# Patient Record
Sex: Male | Born: 1965 | Race: White | Hispanic: No | Smoking: Current every day smoker
Health system: Southern US, Community
[De-identification: ages and names within clinical notes are randomized; demographics above are authoritative.]

## PROBLEM LIST (undated history)

## (undated) DIAGNOSIS — G5621 Lesion of ulnar nerve, right upper limb: Secondary | ICD-10-CM

## (undated) DIAGNOSIS — G4700 Insomnia, unspecified: Secondary | ICD-10-CM

## (undated) DIAGNOSIS — D649 Anemia, unspecified: Secondary | ICD-10-CM

## (undated) DIAGNOSIS — A4902 Methicillin resistant Staphylococcus aureus infection, unspecified site: Secondary | ICD-10-CM

## (undated) DIAGNOSIS — D759 Disease of blood and blood-forming organs, unspecified: Secondary | ICD-10-CM

## (undated) DIAGNOSIS — L0291 Cutaneous abscess, unspecified: Secondary | ICD-10-CM

## (undated) DIAGNOSIS — R519 Headache, unspecified: Secondary | ICD-10-CM

## (undated) DIAGNOSIS — L701 Acne conglobata: Secondary | ICD-10-CM

## (undated) DIAGNOSIS — G4730 Sleep apnea, unspecified: Secondary | ICD-10-CM

## (undated) DIAGNOSIS — E119 Type 2 diabetes mellitus without complications: Secondary | ICD-10-CM

## (undated) DIAGNOSIS — F329 Major depressive disorder, single episode, unspecified: Secondary | ICD-10-CM

## (undated) DIAGNOSIS — R51 Headache: Secondary | ICD-10-CM

## (undated) DIAGNOSIS — I10 Essential (primary) hypertension: Secondary | ICD-10-CM

## (undated) DIAGNOSIS — T4145XA Adverse effect of unspecified anesthetic, initial encounter: Secondary | ICD-10-CM

## (undated) DIAGNOSIS — K219 Gastro-esophageal reflux disease without esophagitis: Secondary | ICD-10-CM

## (undated) DIAGNOSIS — F419 Anxiety disorder, unspecified: Secondary | ICD-10-CM

## (undated) DIAGNOSIS — T8859XA Other complications of anesthesia, initial encounter: Secondary | ICD-10-CM

## (undated) DIAGNOSIS — F32A Depression, unspecified: Secondary | ICD-10-CM

## (undated) DIAGNOSIS — J45909 Unspecified asthma, uncomplicated: Secondary | ICD-10-CM

## (undated) DIAGNOSIS — Z9289 Personal history of other medical treatment: Secondary | ICD-10-CM

## (undated) DIAGNOSIS — K2970 Gastritis, unspecified, without bleeding: Secondary | ICD-10-CM

## (undated) DIAGNOSIS — M1990 Unspecified osteoarthritis, unspecified site: Secondary | ICD-10-CM

## (undated) DIAGNOSIS — L989 Disorder of the skin and subcutaneous tissue, unspecified: Secondary | ICD-10-CM

## (undated) DIAGNOSIS — T7840XA Allergy, unspecified, initial encounter: Secondary | ICD-10-CM

## (undated) HISTORY — DX: Depression, unspecified: F32.A

## (undated) HISTORY — PX: PERCUTANEOUS PINNING WRIST FRACTURE: SHX2211

## (undated) HISTORY — DX: Anemia, unspecified: D64.9

## (undated) HISTORY — PX: OTHER SURGICAL HISTORY: SHX169

## (undated) HISTORY — DX: Sleep apnea, unspecified: G47.30

## (undated) HISTORY — PX: IRRIGATION AND DEBRIDEMENT BUTTOCKS: SHX6601

## (undated) HISTORY — DX: Gastritis, unspecified, without bleeding: K29.70

## (undated) HISTORY — PX: HERNIA REPAIR: SHX51

## (undated) HISTORY — DX: Allergy, unspecified, initial encounter: T78.40XA

## (undated) HISTORY — DX: Unspecified asthma, uncomplicated: J45.909

## (undated) HISTORY — PX: EYE SURGERY: SHX253

## (undated) HISTORY — PX: FRACTURE SURGERY: SHX138

## (undated) HISTORY — DX: Unspecified osteoarthritis, unspecified site: M19.90

## (undated) HISTORY — DX: Essential (primary) hypertension: I10

## (undated) HISTORY — PX: COLONOSCOPY W/ POLYPECTOMY: SHX1380

## (undated) HISTORY — DX: Insomnia, unspecified: G47.00

## (undated) HISTORY — DX: Methicillin resistant Staphylococcus aureus infection, unspecified site: A49.02

## (undated) HISTORY — DX: Anxiety disorder, unspecified: F41.9

## (undated) HISTORY — DX: Cutaneous abscess, unspecified: L02.91

## (undated) HISTORY — DX: Major depressive disorder, single episode, unspecified: F32.9

---
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
## 1987-11-27 HISTORY — PX: COLON RESECTION: SHX5231

## 2012-02-25 ENCOUNTER — Ambulatory Visit (INDEPENDENT_AMBULATORY_CARE_PROVIDER_SITE_OTHER): Payer: BC Managed Care – PPO | Admitting: Family Medicine

## 2012-02-25 VITALS — BP 141/96 | HR 90 | Temp 98.5°F | Resp 18 | Ht 70.0 in | Wt 195.6 lb

## 2012-02-25 DIAGNOSIS — Z2839 Other underimmunization status: Secondary | ICD-10-CM

## 2012-02-25 DIAGNOSIS — Z283 Underimmunization status: Secondary | ICD-10-CM

## 2012-02-25 DIAGNOSIS — L0291 Cutaneous abscess, unspecified: Secondary | ICD-10-CM

## 2012-02-25 DIAGNOSIS — Z23 Encounter for immunization: Secondary | ICD-10-CM

## 2012-02-25 MED ORDER — SULFAMETHOXAZOLE-TRIMETHOPRIM 800-160 MG PO TABS: 1.0000 | ORAL_TABLET | Freq: Two times a day (BID) | ORAL | Status: AC

## 2012-02-25 MED ORDER — TRAMADOL HCL 50 MG PO TABS: 50.0000 mg | ORAL_TABLET | Freq: Three times a day (TID) | ORAL | Status: AC | PRN
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
## 2012-02-25 NOTE — Progress Notes (Signed)
  Subjective:    Patient ID: Michael [name], male    DOB: [birth year], 46 y.o.   MRN: [id number]  HPI    Review of Systems     Objective:   Physical Exam  1% lidocaine with epi, 3cc locally over abscess where patient had attempted draining it yesterday. 1cm incision long and deep to disrupt abscess. Purulent drainage expressed and culture taken.  1/4" plain gauze to pack ~7cm.  Bandaged.      Assessment & Plan:

## 2012-02-27 ENCOUNTER — Encounter: Payer: Self-pay | Admitting: Physician Assistant

## 2012-02-27 ENCOUNTER — Ambulatory Visit (INDEPENDENT_AMBULATORY_CARE_PROVIDER_SITE_OTHER): Payer: BC Managed Care – PPO | Admitting: Physician Assistant

## 2012-02-27 VITALS — BP 107/72 | HR 102 | Temp 98.0°F | Resp 16 | Ht 70.0 in | Wt 195.2 lb

## 2012-02-27 DIAGNOSIS — L0231 Cutaneous abscess of buttock: Secondary | ICD-10-CM

## 2012-02-27 MED ORDER — HYDROCODONE-ACETAMINOPHEN 5-325 MG PO TABS
1.0000 | ORAL_TABLET | Freq: Four times a day (QID) | ORAL | Status: AC | PRN
Start: 2012-02-27 — End: 2012-03-08

## 2012-02-27 NOTE — Progress Notes (Signed)
   Patient ID: Michael [name] MRN: [id number], DOB: Jan 04, 1966 46 y.o. Date of Encounter: 02/27/2012, 12:28 PM  Primary Physician: No primary provider on file.  Chief Complaint: Wound care   See previous note  HPI: 46 y.o. y/o male presents for wound care s/p I&D on 02/25/2012. Doing well No issues or complaints Afebrile/ no chills No nausea or vomiting Tolerating Bactrim DS Pain worsened over the past 24 hours as patient states that he over did it with driving and work Daily dressing change Previous note reviewed  Past Medical History  Diagnosis Date  . Sleep apnea   . Gastritis   . MRSA (methicillin resistant Staphylococcus aureus) infection     left hand  . Skin abscess     Recurrent  . Anxiety   . Hypertension   . Insomnia      Home Meds: Prior to Admission medications   Medication Sig Start Date End Date Taking? Authorizing Provider  ALPRAZolam Prudy Feeler) 1 MG tablet Take 1 mg by mouth at bedtime as needed.   Yes Historical Provider, MD  lisinopril-hydrochlorothiazide (PRINZIDE,ZESTORETIC) 20-12.5 MG per tablet Take 1 tablet by mouth 2 (two) times daily.   Yes Historical Provider, MD  minocycline (MINOCIN,DYNACIN) 100 MG capsule Take 100 mg by mouth 2 (two) times daily.   Yes Historical Provider, MD  omeprazole (PRILOSEC) 20 MG capsule Take 20 mg by mouth daily.   Yes Historical Provider, MD  sulfamethoxazole-trimethoprim (BACTRIM DS,SEPTRA DS) 800-160 MG per tablet Take 1 tablet by mouth 2 (two) times daily. 02/25/12 03/06/12 Yes Thao P Le, DO  testosterone cypionate (DEPOTESTOTERONE CYPIONATE) 100 MG/ML injection Inject into the muscle every 14 (fourteen) days. For IM use only   Yes Historical Provider, MD  traMADol (ULTRAM) 50 MG tablet Take 1 tablet (50 mg total) by mouth every 8 (eight) hours as needed for pain. 02/25/12 03/06/12 Yes Thao P Le, DO  zolpidem (AMBIEN) 10 MG tablet Take 10 mg by mouth at bedtime.   Yes Historical Provider, MD  HYDROcodone-acetaminophen (NORCO)  5-325 MG per tablet Take 1 tablet by mouth every 6 (six) hours as needed for pain. 02/27/12 03/08/12  Sondra Barges, PA-C    Allergies:  Allergies  Allergen Reactions  . Penicillins Rash  . Latex     ROS: Constitutional: Afebrile, no chills Cardiovascular: negative for chest pain or palpitations Dermatological: Positive for wound. Negative for erythema or warmth  GI: No nausea or vomiting   EXAM: Physical Exam: Blood pressure 107/72, pulse 102, temperature 98 F (36.7 C), temperature source Oral, resp. rate 16, height 5\' 10"  (1.778 m), weight 195 lb 3.2 oz (88.542 kg)., Body mass index is 28.01 kg/(m^2). General: Well developed, well nourished, in no acute distress. Nontoxic appearing. Head: Normocephalic, atraumatic, sclera non-icteric.  Neck: Supple. Lungs: Breathing is unlabored. Heart: Normal rate. Skin:  Warm and moist. Dressing and packing in place. Mild induration andtenderness to palpation. No erythema. Neuro: Alert and oriented X 3. Moves all extremities spontaneously. Normal gait.  Psych:  Responds to questions appropriately with a normal affect.   PROCEDURE: Dressing and packing removed. Purulo-bloody discharge expressed Wound bed healthy Irrigated with 1% plain lidocaine 5 cc. Repacked with 1/4 inch plain packing Dressing applied  LAB: Culture: Pending  A/P: 46 y.o. y/o male with cellulitis/abscess as above s/p I&D on 02/25/2012 -Wound care per above -Continue Bactrim DS -Pain well controlled -Daily dressing changes -Recheck 48 hours  Signed, Eula Listen, PA-C 02/27/2012 12:28 PM

## 2012-02-28 LAB — WOUND CULTURE
Gram Stain: NONE SEEN
Gram Stain: NONE SEEN
Organism ID, Bacteria: NO GROWTH

## 2012-02-29 ENCOUNTER — Encounter: Payer: Self-pay | Admitting: Family Medicine

## 2012-03-01 ENCOUNTER — Ambulatory Visit (INDEPENDENT_AMBULATORY_CARE_PROVIDER_SITE_OTHER): Payer: BC Managed Care – PPO | Admitting: Family Medicine

## 2012-03-01 VITALS — BP 101/68 | HR 108 | Temp 97.7°F | Resp 18 | Ht 71.0 in | Wt 195.0 lb

## 2012-03-01 DIAGNOSIS — L0291 Cutaneous abscess, unspecified: Secondary | ICD-10-CM

## 2012-03-01 DIAGNOSIS — L0231 Cutaneous abscess of buttock: Secondary | ICD-10-CM

## 2012-03-01 NOTE — Progress Notes (Signed)
46 year old man comes in for recheck of left gluteal abscess which was drained 2 days ago. He's had no further significant pain.  Objective: Wound is dry, drain is removed, there is no induration or purulent discharge.   assessment: Doing well without sign of cellulitis or deeper infection  Plan daily bathing and

## 2012-03-27 ENCOUNTER — Ambulatory Visit (INDEPENDENT_AMBULATORY_CARE_PROVIDER_SITE_OTHER): Payer: BC Managed Care – PPO

## 2012-03-27 DIAGNOSIS — Z23 Encounter for immunization: Secondary | ICD-10-CM

## 2012-04-04 NOTE — Progress Notes (Signed)
Immunization was verbally authorized by Georgian Co, PA-C and administered by Maurene Capes, CMA.

## 2012-08-26 ENCOUNTER — Ambulatory Visit (INDEPENDENT_AMBULATORY_CARE_PROVIDER_SITE_OTHER): Payer: BC Managed Care – PPO | Admitting: Family Medicine

## 2012-08-26 DIAGNOSIS — Z23 Encounter for immunization: Secondary | ICD-10-CM

## 2012-12-27 ENCOUNTER — Ambulatory Visit (INDEPENDENT_AMBULATORY_CARE_PROVIDER_SITE_OTHER): Payer: BC Managed Care – PPO | Admitting: Family Medicine

## 2012-12-27 VITALS — BP 127/80 | HR 106 | Temp 98.0°F | Resp 16 | Ht 71.0 in | Wt 195.6 lb

## 2012-12-27 DIAGNOSIS — L02818 Cutaneous abscess of other sites: Secondary | ICD-10-CM

## 2012-12-27 DIAGNOSIS — R52 Pain, unspecified: Secondary | ICD-10-CM

## 2012-12-27 DIAGNOSIS — L0231 Cutaneous abscess of buttock: Secondary | ICD-10-CM

## 2012-12-27 MED ORDER — LIDOCAINE-PRILOCAINE 2.5-2.5 % EX CREA: TOPICAL_CREAM | CUTANEOUS | Status: DC | PRN
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
## 2013-01-02 ENCOUNTER — Ambulatory Visit (INDEPENDENT_AMBULATORY_CARE_PROVIDER_SITE_OTHER): Payer: BC Managed Care – PPO | Admitting: Family Medicine

## 2013-01-02 VITALS — BP 127/84 | HR 104 | Temp 98.9°F | Resp 16 | Ht 71.0 in | Wt 196.6 lb

## 2013-01-02 DIAGNOSIS — L732 Hidradenitis suppurativa: Secondary | ICD-10-CM

## 2013-01-02 DIAGNOSIS — L03317 Cellulitis of buttock: Secondary | ICD-10-CM

## 2013-01-02 DIAGNOSIS — R21 Rash and other nonspecific skin eruption: Secondary | ICD-10-CM

## 2013-01-02 DIAGNOSIS — N489 Disorder of penis, unspecified: Secondary | ICD-10-CM

## 2013-01-02 DIAGNOSIS — L0231 Cutaneous abscess of buttock: Secondary | ICD-10-CM

## 2013-01-02 MED ORDER — HYDROCORTISONE ACE-PRAMOXINE 2.5-1 % RE CREA: TOPICAL_CREAM | RECTAL | Status: DC

## 2013-01-02 NOTE — Patient Instructions (Addendum)
Continue your antibiotic for the buttock abscess. If it continues to get bigger and draining again , Then we probably should go ahead and lance it.   Use the Analpram 2.5% 3 times daily on the rash on penis and scrotum. Discontinue using 5 days. If not much improved get back to [place].

## 2013-01-02 NOTE — Progress Notes (Signed)
Subjective: 47 year old man who has a lot of problems from his generalized hidradenitis. He is seeing a Engineer, petroleum in [place] who is planning to excise some large chunks of tissue from his buttocks this summer. He is just completing this week a course of Accutane. He's had recurrent abscesses also. He has a place on his buttock that he been seen for last week. He was treated with doxycycline, and it was felt that it was not ready to be drained. Last night it was more painful and it burst open and drained a lot of blood and pus. It is hard for him to sit down sometimes. He is here for recheck of it. He used a hair removing preparation around his genitalia. It typically cause allergic reaction along the shaft of his penis. He has not been sexually involved for several months, and denies he has ever had herpes.  He also has rash from the depiliatory medication on his upper back.  Objective: He has terrible skin from his cystic lesions all over his body, his back and buttocks and other areas. He has a red rash on his upper back where he used the topical. He has some cystic places on his left buttock, moderately large, but not acutely infected at this time. On the right buttock just above the crease to his thigh, below the rectum, is an area of abscess that appears to have just recently drained, however the hole was sealed over. It is not red or inflamed. It feels like about 3 or so separate cysts together, with the largest one most superiorly. It probably measures about 2.5 CM's in diameter. However I believe that is resolving after having drained last night. He has not "hot" at this time. The shaft of his penis at the base of the scrotum are overall. There is a little excoriated area on the shaft of his penis, which is probably bled a few drops.  Assessment: Allergic reaction to hair removing topical medication Hidradenitis Cystic abscess on buttock  Plan: Continue the doxycycline. Gave him some  Analpram 2.5% cream to use on the shaft of the penis because it's so painful. He can also use it on his scrotum.  Continue the antibiotics. I did not try and drain the lesion today, because I believe it is resolving since it drained by itself last night.  Return if worse.

## 2013-09-16 ENCOUNTER — Ambulatory Visit: Payer: BC Managed Care – PPO | Admitting: Emergency Medicine

## 2013-09-16 VITALS — BP 124/86 | HR 114 | Temp 98.5°F | Resp 20 | Ht 70.0 in | Wt 202.4 lb

## 2013-09-16 DIAGNOSIS — R066 Hiccough: Secondary | ICD-10-CM

## 2013-09-16 DIAGNOSIS — R112 Nausea with vomiting, unspecified: Secondary | ICD-10-CM

## 2013-09-16 MED ORDER — ONDANSETRON 8 MG PO TBDP: 8.0000 mg | ORAL_TABLET | Freq: Three times a day (TID) | ORAL | Status: DC | PRN

## 2013-09-16 MED ORDER — PROCHLORPERAZINE MALEATE 10 MG PO TABS: 10.0000 mg | ORAL_TABLET | Freq: Four times a day (QID) | ORAL | Status: DC | PRN

## 2013-09-16 NOTE — Patient Instructions (Signed)
Hiccups °Hiccups are caused by a sudden contraction of the muscles between the ribs and the muscle under your lungs (diaphragm). When you hiccup, the top of your windpipe (glottis) closes immediately after your diaphragm contracts. This makes the typical 'hic' sound. A hiccup is a reflex that you cannot stop. Unlike other reflexes such as coughing and sneezing, hiccups do not seem to have any useful purpose. There are 3 types of hiccups:  °· Benign bouts: last less than 48 hours. °· Persistent: last more than 48 hours but less than 1 month. °· Intractable: last more than 1 month. °CAUSES  °Most people have bouts of hiccups from time to time. They start for no apparent reason, last a short while, and then stop. Sometimes they are due to: °· A temporary swollen stomach caused by overeating or eating too fast, eating spicy foods, drinking fizzy drinks, or swallowing air. °· A sudden change in temperature (very hot or cold foods or drinks, a cold shower). °· Drinking alcohol or using tobacco. °There are no particular tests used to diagnose hiccups. Hiccups are usually considered harmless and do not point to a serious medical condition. However, there can be underlying medical problems that may cause hiccups, such as pneumonia, diabetes, metabolic problems, tumors, abdominal infections or injuries, and neurologic problems. You must follow up with your caregiver if your symptoms persist or become a frequent problem. °TREATMENT  °· Most cases need no treatment. A bout of benign hiccups usually does not last long. °· Medicine is sometimes needed to stop persistent hiccups. Medicine may be given intravenously (IV) or by mouth. °· Hypnosis or acupuncture may be suggested. °· Surgery to affect the nerve that supplies the diaphragm may be tried in severe cases. °· Treatment of an underlying cause is needed in some cases. °HOME CARE INSTRUCTIONS  °Popular remedies that may stop a bout of hiccups include: °· Gargling ice  water. °· Swallowing granulated sugar. °· Biting on a lemon. °· Holding your breath, breathing fast, or breathing into a paper bag. °· Bearing down. °· Gasping after a sudden fright. °· Pulling your tongue gently. °· Distraction. °SEEK MEDICAL CARE IF:  °· Hiccups last for more than 48 hours. °· You are given medicine but your hiccups do not get better. °· New symptoms show up. °· You cannot sleep or eat due to the hiccups. °· You have unexpected weight loss. °· You have trouble breathing or swallowing. °· You develop severe pain in your abdomen or other areas. °· You develop numbness, tingling, or weakness. °Document Released: 01/21/2002 Document Revised: 02/04/2012 Document Reviewed: 01/03/2011 °[employer]® Patient Information ©2014 [employer]. ° °
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
## 2014-05-17 ENCOUNTER — Other Ambulatory Visit: Payer: Self-pay | Admitting: Emergency Medicine

## 2014-05-17 ENCOUNTER — Ambulatory Visit (INDEPENDENT_AMBULATORY_CARE_PROVIDER_SITE_OTHER): Payer: BC Managed Care – PPO | Admitting: Emergency Medicine

## 2014-05-17 VITALS — BP 108/74 | HR 110 | Temp 98.2°F | Resp 16 | Ht 70.75 in | Wt 204.4 lb

## 2014-05-17 DIAGNOSIS — R197 Diarrhea, unspecified: Secondary | ICD-10-CM

## 2014-05-17 LAB — POCT CBC
GRANULOCYTE PERCENT: 71.2 % (ref 37–80)
HCT, POC: 48.8 % (ref 43.5–53.7)
Hemoglobin: 15.8 g/dL (ref 14.1–18.1)
Lymph, poc: 2.3 (ref 0.6–3.4)
MCH, POC: 31.6 pg — AB (ref 27–31.2)
MCHC: 32.4 g/dL (ref 31.8–35.4)
MCV: 97.7 fL — AB (ref 80–97)
MID (CBC): 1 — AB (ref 0–0.9)
MPV: 7.4 fL (ref 0–99.8)
PLATELET COUNT, POC: 493 10*3/uL — AB (ref 142–424)
POC GRANULOCYTE: 8.2 — AB (ref 2–6.9)
POC LYMPH %: 20.2 % (ref 10–50)
POC MID %: 8.6 %M (ref 0–12)
RBC: 5 M/uL (ref 4.69–6.13)
RDW, POC: 13.7 %
WBC: 11.5 10*3/uL — AB (ref 4.6–10.2)

## 2014-05-17 LAB — POCT URINALYSIS DIPSTICK
BILIRUBIN UA: NEGATIVE
GLUCOSE UA: NEGATIVE
KETONES UA: NEGATIVE
LEUKOCYTES UA: NEGATIVE
Nitrite, UA: NEGATIVE
PROTEIN UA: NEGATIVE
Spec Grav, UA: 1.015
Urobilinogen, UA: 1
pH, UA: 7

## 2014-05-17 LAB — POCT UA - MICROSCOPIC ONLY
Bacteria, U Microscopic: NEGATIVE
CRYSTALS, UR, HPF, POC: NEGATIVE
Casts, Ur, LPF, POC: NEGATIVE
Mucus, UA: NEGATIVE
WBC, UR, HPF, POC: NEGATIVE
Yeast, UA: NEGATIVE
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
## 2014-05-17 NOTE — Patient Instructions (Signed)
diarDiarrhea Diarrhea is frequent loose and watery bowel movements. It can cause you to feel weak and dehydrated. Dehydration can cause you to become tired and thirsty, have a dry mouth, and have decreased urination that often is dark yellow. Diarrhea is a sign of another problem, most often an infection that will not last long. In most cases, diarrhea typically lasts 2-3 days. However, it can last longer if it is a sign of something more serious. It is important to treat your diarrhea as directed by your caregive to lessen or prevent future episodes of diarrhea. CAUSES  Some common causes include:  Gastrointestinal infections caused by viruses, bacteria, or parasites.  Food poisoning or food allergies.  Certain medicines, such as antibiotics, chemotherapy, and laxatives.  Artificial sweeteners and fructose.  Digestive disorders. HOME CARE INSTRUCTIONS  Ensure adequate fluid intake (hydration): have 1 cup (8 oz) of fluid for each diarrhea episode. Avoid fluids that contain simple sugars or sports drinks, fruit juices, whole milk products, and sodas. Your urine should be clear or pale yellow if you are drinking enough fluids. Hydrate with an oral rehydration solution that you can purchase at pharmacies, retail stores, and online. You can prepare an oral rehydration solution at home by mixing the following ingredients together:   - tsp table salt.   tsp baking soda.   tsp salt substitute containing potassium chloride.  1  tablespoons sugar.  1 L (34 oz) of water.  Certain foods and beverages may increase the speed at which food moves through the gastrointestinal (GI) tract. These foods and beverages should be avoided and include:  Caffeinated and alcoholic beverages.  High-fiber foods, such as raw fruits and vegetables, nuts, seeds, and whole grain breads and cereals.  Foods and beverages sweetened with sugar alcohols, such as xylitol, sorbitol, and mannitol.  Some foods may be  well tolerated and may help thicken stool including:  Starchy foods, such as rice, toast, pasta, low-sugar cereal, oatmeal, grits, baked potatoes, crackers, and bagels.  Bananas.  Applesauce.  Add probiotic-rich foods to help increase healthy bacteria in the GI tract, such as yogurt and fermented milk products.  Wash your hands well after each diarrhea episode.  Only take over-the-counter or prescription medicines as directed by your caregiver.  Take a warm bath to relieve any burning or pain from frequent diarrhea episodes. SEEK IMMEDIATE MEDICAL CARE IF:   You are unable to keep fluids down.  You have persistent vomiting.  You have blood in your stool, or your stools are black and tarry.  You do not urinate in 6-8 hours, or there is only a small amount of very dark urine.  You have abdominal pain that increases or localizes.  You have weakness, dizziness, confusion, or lightheadedness.  You have a severe headache.  Your diarrhea gets worse or does not get better.  You have a fever or persistent symptoms for more than 2-3 days.  You have a fever and your symptoms suddenly get worse. MAKE SURE YOU:   Understand these instructions.  Will watch your condition.  Will get help right away if you are not doing well or get worse. Document Released: 11/02/2002 Document Revised: 10/29/2012 Document Reviewed: 07/20/2012 [facility] Patient Information 2015 [employer] [place]. This information is not intended to replace advice given to you by your health care [place]. Make sure you discuss any questions you have with your health care Sharone Almond.

## 2014-05-18 LAB — C. DIFFICILE GDH AND TOXIN A/B
C. DIFFICILE GDH: NOT DETECTED
C. difficile Toxin A/B: NOT DETECTED

## 2014-05-18 LAB — HIV ANTIBODY (ROUTINE TESTING W REFLEX): HIV 1&2 Ab, 4th Generation: NONREACTIVE

## 2014-05-19 ENCOUNTER — Other Ambulatory Visit: Payer: Self-pay | Admitting: Emergency Medicine

## 2014-05-19 LAB — OVA AND PARASITE EXAMINATION

## 2014-05-19 MED ORDER — TINIDAZOLE 500 MG PO TABS: 2.0000 g | ORAL_TABLET | Freq: Once | ORAL | Status: DC

## 2014-05-20 ENCOUNTER — Encounter: Payer: Self-pay | Admitting: Internal Medicine

## 2014-05-22 LAB — STOOL CULTURE

## 2014-07-27 ENCOUNTER — Ambulatory Visit: Payer: Self-pay | Admitting: Internal Medicine

## 2014-11-11 ENCOUNTER — Ambulatory Visit (INDEPENDENT_AMBULATORY_CARE_PROVIDER_SITE_OTHER): Payer: BC Managed Care – PPO | Admitting: Physician Assistant

## 2014-11-11 VITALS — BP 102/74 | HR 120 | Temp 98.3°F | Resp 20 | Ht 70.0 in | Wt 222.2 lb

## 2014-11-11 DIAGNOSIS — L732 Hidradenitis suppurativa: Secondary | ICD-10-CM

## 2014-11-11 DIAGNOSIS — L0231 Cutaneous abscess of buttock: Secondary | ICD-10-CM

## 2014-11-11 MED ORDER — HYDROCODONE-ACETAMINOPHEN 5-325 MG PO TABS: 1.0000 | ORAL_TABLET | Freq: Four times a day (QID) | ORAL | Status: DC | PRN

## 2014-11-11 MED ORDER — DOXYCYCLINE HYCLATE 100 MG PO CAPS: 100.0000 mg | ORAL_CAPSULE | Freq: Two times a day (BID) | ORAL | Status: DC

## 2014-11-11 NOTE — Progress Notes (Signed)
Subjective:    Patient ID: Michael [name], male    DOB: 06/13/1966, 48 y.o.   MRN: [id number]  PCP: No PCP Per Patient  Chief Complaint  Patient presents with  . Recurrent Skin Infections    Buttocks   There are no active problems to display for this patient.  Prior to Admission medications   Medication Sig Start Date End Date Taking? Authorizing Provider  Adalimumab (HUMIRA) 40 MG/0.8ML PSKT Inject into the skin.   Yes Historical Provider, MD  ALPRAZolam Prudy Feeler(XANAX) 1 MG tablet Take 1 mg by mouth at bedtime as needed.   Yes Historical Provider, MD  amphetamine-dextroamphetamine (ADDERALL) 20 MG tablet Take 20 mg by mouth 2 (two) times daily.   Yes Historical Provider, MD  Benzoyl Peroxide (BENZOYL PEROXIDE) 10 % LIQD Apply topically.   Yes Historical Provider, MD  Clindamycin Phosphate foam Apply topically 2 (two) times daily.   Yes Historical Provider, MD  clobetasol (OLUX) 0.05 % topical foam Apply topically 2 (two) times daily.   Yes Historical Provider, MD  fluocinonide gel (LIDEX) 0.05 % Apply topically 2 (two) times daily.   Yes Historical Provider, MD  lisinopril-hydrochlorothiazide (PRINZIDE,ZESTORETIC) 20-12.5 MG per tablet Take 1 tablet by mouth 2 (two) times daily.   Yes Historical Provider, MD  omeprazole (PRILOSEC) 20 MG capsule Take 20 mg by mouth daily.   Yes Historical Provider, MD  SUMAtriptan (IMITREX) 6 MG/0.5ML SOLN injection Inject 6 mg into the skin every 2 (two) hours as needed. F   Yes Historical Provider, MD  tadalafil (CIALIS) 20 MG tablet Take 20 mg by mouth daily as needed.   Yes Historical Provider, MD  testosterone cypionate (DEPOTESTOTERONE CYPIONATE) 100 MG/ML injection Inject into the muscle every 14 (fourteen) days. For IM use only   Yes Historical Provider, MD  zolpidem (AMBIEN) 10 MG tablet Take 10 mg by mouth at bedtime.   Yes Historical Provider, MD  hydrocortisone (ANUSOL-HC) 25 MG suppository Place 25 mg rectally 2 (two) times daily.    Historical  Provider, MD  lidocaine (LIDODERM) 5 % Place 1 patch onto the skin daily. Remove & Discard patch within 12 hours or as directed by MD    Historical Provider, MD  lidocaine-prilocaine (EMLA) cream Apply topically as needed. Patient not taking: Reported on 11/11/2014 12/27/12   Anders SimmondsAngela M McClung, PA-C   Medications, allergies, past medical history, surgical history, family history, social history and problem list reviewed and updated.  HPI  [age] yom with pmh recurrent gluteal cysts, abscess, and cellulitis presents with return of abscess over past week.  Sx started approx one week ago with painful lesions on buttocks and erythema over the area. He noticed erythema and warmth tracking along his right hip and some pain in his right hip several days ago. The pain has decreased a bit the past couple days but still present. He believes the warmth is moving over the right hip. He believes the warmth and discomfort is actually in the skin overlying the hip and buttock, not in the hip itself.   Denies fever or chills. [facility]. Last saw him approx 6 months ago. He has him on humira and follows him for this. Pt states he has had significant hx of these abscesses. Has been worked up for immune system probs before and everything was normal. Neg HIV test 6 months ago. His derm and other providers have drained many of these lesions over the years.   Review of  Systems No night sweats, unintentional wt loss, fever, chills.     Objective:   Physical Exam  Constitutional: He is oriented to person, place, and time. He appears well-developed and well-nourished.  Non-toxic appearance. He does not have a sickly appearance. He does not appear ill. No distress.  BP 102/74 mmHg  Pulse 120  Temp(Src) 98.3 F (36.8 C) (Oral)  Resp 20  Ht 5\' 10"  (1.778 m)  Wt 222 lb 4 oz (100.812 kg)  BMI 31.89 kg/m2  SpO2 97%   Musculoskeletal:       Right hip: Normal. He exhibits no  tenderness, no bony tenderness and no swelling.  Neurological: He is alert and oriented to person, place, and time.  Skin: Rash noted.     6-8 abscesses over right buttock extending slightly down right prox posterior thigh and across buttock toward posterior right hip. Significant scarring from previous incisions. Majority of abscesses indurated and scarred. One abscess over mid right buttock proximal portion is approx 4cm x 4cm and mildly fluctuant in middle.    Verbal Consent Obtained. Local anesthesia with 5 cc of 2% lidocaine with epi.  11 blade used to incise the lesion centrally.  Purulence expressed. Wound packed with 1/4 inch plain packing.  Cleansed and dressed.     Assessment & Plan:   Abscess of buttock - Plan: doxycycline (VIBRAMYCIN) 100 MG capsule, HYDROcodone-acetaminophen (NORCO) 5-325 MG per tablet, Wound culture Hydradenitis - Plan: doxycycline (VIBRAMYCIN) 100 MG capsule, HYDROcodone-acetaminophen (NORCO) 5-325 MG per tablet --recurrent abscesses, pt followed by derm at [facility] --I&D one fluctuant abscess, packed, wound care instructions given as pt [place] travelling to [place] tomorrow for the [place] and cannot rtc for wound care --doxy --norco for short term pain control [place], pt verbalized he will only be passenger in car as long as he is on norco --ed/uc instructions given for while pt in [place]  Donnajean Lopesodd M. Jaianna Nicoll, PA-C Physician Assistant-Certified [facility]  11/12/2014 9:37 PM

## 2014-11-11 NOTE — Patient Instructions (Signed)
We did an incision and drainage of an abscess on your buttock.  Please take the doxycycline twice daily for 10 days. Please take the norco every 6 hours as needed for the pain after the incision.  Please change the dressing in 24 hours.  Please take the packing out in 48 hours.  You can shower in 24 hours. Please change the dressings every 24 hours until you are healed. If you begin to have fever, chills, or increased pain in the area please be sure to go to the ED asap.

## 2014-11-15 LAB — WOUND CULTURE: Gram Stain: NONE SEEN

## 2014-11-16 ENCOUNTER — Telehealth: Payer: Self-pay | Admitting: Physician Assistant

## 2014-11-16 NOTE — Telephone Encounter (Signed)
Call[facility] and [facility] with [facility]ist at [facility] who informed me doxy is an acceptable choice for this pts culture results. Contacted and informed pt. He is feeling better and plans to f/u with us next week when he returns from [place].  Donnajean Lopesodd M. Javarri Segal, PA-C Physician Assistant-Certified [facility]  11/16/2014 5:02 PM

## 2015-02-26 ENCOUNTER — Ambulatory Visit (INDEPENDENT_AMBULATORY_CARE_PROVIDER_SITE_OTHER): Payer: BLUE CROSS/BLUE SHIELD | Admitting: Family Medicine

## 2015-02-26 ENCOUNTER — Ambulatory Visit (INDEPENDENT_AMBULATORY_CARE_PROVIDER_SITE_OTHER): Payer: BLUE CROSS/BLUE SHIELD

## 2015-02-26 VITALS — BP 100/70 | HR 109 | Temp 98.7°F | Resp 20 | Ht 70.5 in | Wt 216.4 lb

## 2015-02-26 DIAGNOSIS — R05 Cough: Secondary | ICD-10-CM

## 2015-02-26 DIAGNOSIS — L732 Hidradenitis suppurativa: Secondary | ICD-10-CM | POA: Diagnosis not present

## 2015-02-26 DIAGNOSIS — K219 Gastro-esophageal reflux disease without esophagitis: Secondary | ICD-10-CM | POA: Diagnosis not present

## 2015-02-26 DIAGNOSIS — G4733 Obstructive sleep apnea (adult) (pediatric): Secondary | ICD-10-CM

## 2015-02-26 DIAGNOSIS — E291 Testicular hypofunction: Secondary | ICD-10-CM | POA: Diagnosis not present

## 2015-02-26 DIAGNOSIS — J029 Acute pharyngitis, unspecified: Secondary | ICD-10-CM

## 2015-02-26 DIAGNOSIS — R066 Hiccough: Secondary | ICD-10-CM | POA: Diagnosis not present

## 2015-02-26 DIAGNOSIS — R509 Fever, unspecified: Secondary | ICD-10-CM

## 2015-02-26 DIAGNOSIS — F988 Other specified behavioral and emotional disorders with onset usually occurring in childhood and adolescence: Secondary | ICD-10-CM

## 2015-02-26 DIAGNOSIS — R059 Cough, unspecified: Secondary | ICD-10-CM

## 2015-02-26 DIAGNOSIS — F909 Attention-deficit hyperactivity disorder, unspecified type: Secondary | ICD-10-CM

## 2015-02-26 LAB — POCT INFLUENZA A/B
Influenza A, POC: NEGATIVE
Influenza B, POC: NEGATIVE

## 2015-02-26 MED ORDER — CHLORPROMAZINE HCL 10 MG PO TABS: 10.0000 mg | ORAL_TABLET | Freq: Every evening | ORAL | Status: DC | PRN

## 2015-02-26 MED ORDER — AZITHROMYCIN 250 MG PO TABS: ORAL_TABLET | ORAL | Status: DC

## 2015-02-26 MED ORDER — HYDROCOD POLST-CHLORPHEN POLST 10-8 MG/5ML PO LQCR: 5.0000 mL | Freq: Two times a day (BID) | ORAL | Status: DC | PRN

## 2015-02-26 NOTE — Progress Notes (Addendum)
Patient ID: Michael [name], male   DOB: Nov 24, 1966, 49 y.o.   MRN: [id number]   This chart was scribed for Michael Sidle, MD by Michael [name], ED Scribe at [facility].The patient was seen in exam room 08 and the patient's care was started at 10:10 AM.  Patient ID: Michael [name] MRN: [id number], DOB: May 30, 1966, 49 y.o. Date of Encounter: 02/26/2015  Primary Physician: No PCP Per Patient  Chief Complaint:  Chief Complaint  Patient presents with  . Cough    Fever 102  2 days ago .x 2 weeks   . Emesis    nausea  . Hiccups    since last week   HPI:  Michael [name] is a 49 y.o. male who presents to [facility] complaining of new cough and sore throat, onset 9 days ago. Pt then developed flu like symptoms, acute onset  2 days ago. He has a fever of 102, chills, hoarseness, nausea and emesis as associated symptoms. Pt has had sick contacts. His son has similar symptoms. He has gotten the flu shot. He currently he smokes 0.25 packs a day.He normally takes Ambien for sleep apnea but has run out of his medication and he has a dream that he dropped it and is unsure of the dream is true or not. He takes Metoclopramide for chronic hiccups. He has chronic insomnia and wants to takes something other than Ambien but he is already taking trazodone. He is worried the Ambien is giving him amnesia.   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   Past Medical History  Diagnosis Date  . Sleep apnea   . Gastritis   . MRSA (methicillin resistant Staphylococcus aureus) infection     left hand  . Skin abscess     Recurrent  . Anxiety   . Hypertension   . Insomnia   . Allergy   . Arthritis   . Depression   . Asthma     Home Meds: Prior to Admission medications   Medication Sig Start Date End Date Taking? Authorizing Provider  Adalimumab (HUMIRA) 40 MG/0.8ML PSKT Inject into the skin.   Yes Historical Provider, MD  ALPRAZolam Prudy Feeler) 1 MG tablet  Take 1 mg by mouth at bedtime as needed.   Yes Historical Provider, MD  amphetamine-dextroamphetamine (ADDERALL) 20 MG tablet Take 20 mg by mouth 2 (two) times daily.   Yes Historical Provider, MD  Clindamycin Phosphate foam Apply topically 2 (two) times daily.   Yes Historical Provider, MD  clobetasol (OLUX) 0.05 % topical foam Apply topically 2 (two) times daily.   Yes Historical Provider, MD  doxycycline (VIBRAMYCIN) 100 MG capsule Take 1 capsule (100 mg total) by mouth 2 (two) times daily. 11/11/14  Yes Huey Bienenstock McVeigh, PA  HYDROcodone-acetaminophen (NORCO) 5-325 MG per tablet Take 1 tablet by mouth every 6 (six) hours as needed. 11/11/14  Yes Huey Bienenstock McVeigh, PA  hydrocortisone (ANUSOL-HC) 25 MG suppository Place 25 mg rectally 2 (two) times daily.   Yes Historical Provider, MD  lisinopril-hydrochlorothiazide (PRINZIDE,ZESTORETIC) 20-12.5 MG per tablet Take 1 tablet by mouth 2 (two) times daily.   Yes Historical Provider, MD  omeprazole (PRILOSEC) 20 MG capsule Take 20 mg by mouth daily.   Yes Historical Provider, MD  SUMAtriptan (IMITREX) 6 MG/0.5ML SOLN injection Inject 6 mg into the skin every 2 (two) hours as needed. F   Yes Historical Provider, MD  tadalafil (CIALIS)  20 MG tablet Take 20 mg by mouth daily as needed.   Yes Historical Provider, MD  testosterone cypionate (DEPOTESTOTERONE CYPIONATE) 100 MG/ML injection Inject into the muscle every 14 (fourteen) days. For IM use only   Yes Historical Provider, MD  zolpidem (AMBIEN) 10 MG tablet Take 10 mg by mouth at bedtime.   Yes Historical Provider, MD  fluocinonide gel (LIDEX) 0.05 % Apply topically 2 (two) times daily.    Historical Provider, MD  lidocaine-prilocaine (EMLA) cream Apply topically as needed. Patient not taking: Reported on 11/11/2014 12/27/12   Michael Simmonds, PA-C   Allergies:  Allergies  Allergen Reactions  . Penicillins Rash  . Latex    History   Social History  . Marital Status: Significant Other    Spouse Name:  N/A  . Number of Children: N/A  . Years of Education: N/A   Occupational History  . Not on file.   Social History Main Topics  . Smoking status: Current Every Day Smoker -- 20.00 packs/day    Types: Cigarettes  . Smokeless tobacco: Never Used  . Alcohol Use: 1.2 oz/week    2 Standard drinks or equivalent per week  . Drug Use: No  . Sexual Activity: Yes   Other Topics Concern  . Not on file   Social History Narrative    Review of Systems: Constitutional: negative night sweats, weight changes, or fatigue. Positive for fever and chills. HEENT: negative for vision changes, hearing loss, congestion, rhinorrhea, ST, epistaxis, or sinus pressure. Positive for sore throat, hoarseness. Cardiovascular: negative for chest pain or palpitations Respiratory: negative for hemoptysis, wheezing, shortness of breath. Positive for cough. Abdominal: negative for abdominal pain, diarrhea, or constipation. Positive for nausea and vomiting. Dermatological: negative for rash Neurologic: negative for headache, dizziness, or syncope All other systems reviewed and are otherwise negative with the exception to those above and in the HPI.  Physical Exam: Blood pressure 100/70, pulse 109, temperature 98.7 F (37.1 C), temperature source Oral, resp. rate 20, height 5' 10.5" (1.791 m), weight 216 lb 6.4 oz (98.158 kg), SpO2 96 %., Body mass index is 30.6 kg/(m^2). General: Well developed, well nourished, in no acute distress. Head: Normocephalic, atraumatic, eyes without discharge, sclera non-icteric, nares are without discharge. Bilateral auditory canals clear, TM's are without perforation, pearly grey and translucent with reflective cone of light bilaterally. Oral cavity moist, posterior pharynx without exudate, erythema, peritonsillar abscess, or post nasal drip.  Neck: Supple. No thyromegaly. Full ROM. No lymphadenopathy. Lungs: Clear bilaterally to auscultation without wheezes, rales, or rhonchi. Breathing  is unlabored. Heart: RRR with S1 S2. No murmurs, rubs, or gallops appreciated. Abdomen: Soft, non-tender, non-distended with normoactive bowel sounds. No hepatomegaly. No rebound/guarding. No obvious abdominal masses. Msk:  Strength and tone normal for age. Extremities/Skin: Warm and dry. No clubbing or cyanosis. No edema. No rashes or suspicious lesions. Neuro: Alert and oriented X 3. Moves all extremities spontaneously. Gait is normal. CNII-XII grossly in tact. Psych:  Responds to questions appropriately with a normal affect.    [facility] reading (PRIMARY) by  Dr. Milus Glazier.  Streaky right lower lobe densities Results for orders placed or performed in visit on 02/26/15  POCT Influenza A/B  Result Value Ref Range   Influenza A, POC Negative    Influenza B, POC Negative      ASSESSMENT AND PLAN:  49 y.o. year old male with what appears to be acute bronchitis.  1. Cough   2. Sore throat   3. Fever and  chills    This chart was scribed in my presence and reviewed by me personally.    ICD-9-CM ICD-10-CM   1. Cough 786.2 R05 DG Chest 2 View     DG Chest 2 View     POCT Influenza A/B     azithromycin (ZITHROMAX Z-PAK) 250 MG tablet     chlorpheniramine-HYDROcodone (TUSSIONEX PENNKINETIC ER) 10-8 MG/5ML LQCR  2. Sore throat 462 J02.9 DG Chest 2 View     DG Chest 2 View     POCT Influenza A/B     azithromycin (ZITHROMAX Z-PAK) 250 MG tablet  3. Fever and chills 780.60 R50.9 DG Chest 2 View     DG Chest 2 View     POCT Influenza A/B     azithromycin (ZITHROMAX Z-PAK) 250 MG tablet  4. Obstructive sleep apnea 327.23 G47.33   5. Hidradenitis 705.83 L73.2   6. Hypogonadism male 257.2 E29.1   7. ADD (attention deficit disorder) 314.00 F90.9   8. Gastroesophageal reflux disease without esophagitis 530.81 K21.9   9. Hiccups 786.8 R06.6 chlorproMAZINE (THORAZINE) 10 MG tablet     Signed, Michael SidleKurt Arlana Canizales, MD  Signed, Michael SidleKurt Kenyette Gundy, MD 02/26/2015 10:24 AM

## 2015-09-08 ENCOUNTER — Ambulatory Visit (INDEPENDENT_AMBULATORY_CARE_PROVIDER_SITE_OTHER): Payer: BLUE CROSS/BLUE SHIELD | Admitting: Emergency Medicine

## 2015-09-08 ENCOUNTER — Ambulatory Visit (INDEPENDENT_AMBULATORY_CARE_PROVIDER_SITE_OTHER): Payer: BLUE CROSS/BLUE SHIELD

## 2015-09-08 VITALS — BP 140/88 | HR 113 | Temp 98.7°F | Resp 18 | Ht 70.25 in | Wt 214.6 lb

## 2015-09-08 DIAGNOSIS — S8002XA Contusion of left knee, initial encounter: Secondary | ICD-10-CM

## 2015-09-08 DIAGNOSIS — Z23 Encounter for immunization: Secondary | ICD-10-CM

## 2015-09-08 DIAGNOSIS — L732 Hidradenitis suppurativa: Secondary | ICD-10-CM

## 2015-09-08 DIAGNOSIS — S80212A Abrasion, left knee, initial encounter: Secondary | ICD-10-CM | POA: Diagnosis not present

## 2015-09-08 MED ORDER — SULFAMETHOXAZOLE-TRIMETHOPRIM 800-160 MG PO TABS: 2.0000 | ORAL_TABLET | Freq: Two times a day (BID) | ORAL | Status: DC

## 2015-09-08 NOTE — Progress Notes (Signed)
Subjective:  Patient ID: Michael [name], male    DOB: 1966-02-06  Age: 49 y.o. MRN: [id number]  CC: Immunizations; Knee Pain; and Cyst   HPI [name] presents  was out walking last night and he tripped over a loose brick and fell he landed on his left knee. He has an abrasion of the left knee. He has full range of motion of the knee with no pain on ambulation or standing. Has a new abscess on his left lower back just above the belt line. He has a history of hidradenitis suppurativa.  History [name] has a past medical history of Sleep apnea; Gastritis; MRSA (methicillin resistant Staphylococcus aureus) infection; Skin abscess; Anxiety; Hypertension; Insomnia; Allergy; Arthritis; Depression; and Asthma.   He has past surgical history that includes Colon resection (1989); left arm surgery; Hernia repair; Fracture surgery; Small intestine surgery; and Eye surgery.   His  family history includes Alcohol abuse in his father; Arthritis in his mother; Diabetes in his maternal grandfather, maternal grandmother, and mother; Heart disease in his maternal grandfather and maternal grandmother; Hyperlipidemia in his maternal grandmother; Hypertension in his maternal grandfather, maternal grandmother, and mother.  He   reports that he has been smoking Cigarettes.  He has been smoking about 20.00 packs per day. He has never used smokeless tobacco. He reports that he drinks about 1.2 oz of alcohol per week. He reports that he does not use illicit drugs.  Outpatient Prescriptions Prior to Visit  Medication Sig Dispense Refill  . Adalimumab (HUMIRA) 40 MG/0.8ML PSKT Inject into the skin.    Marland Kitchen ALPRAZolam (XANAX) 1 MG tablet Take 1 mg by mouth at bedtime as needed.    Marland Kitchen amphetamine-dextroamphetamine (ADDERALL) 20 MG tablet Take 20 mg by mouth 2 (two) times daily.    . Clindamycin Phosphate foam Apply topically 2 (two) times daily.    . clobetasol (OLUX) 0.05 % topical foam Apply topically 2 (two) times  daily.    Marland Kitchen lisinopril-hydrochlorothiazide (PRINZIDE,ZESTORETIC) 20-12.5 MG per tablet Take 1 tablet by mouth 2 (two) times daily.    Marland Kitchen omeprazole (PRILOSEC) 20 MG capsule Take 20 mg by mouth daily.    . SUMAtriptan (IMITREX) 6 MG/0.5ML SOLN injection Inject 6 mg into the skin every 2 (two) hours as needed. F    . tadalafil (CIALIS) 20 MG tablet Take 20 mg by mouth daily as needed.    . testosterone cypionate (DEPOTESTOTERONE CYPIONATE) 100 MG/ML injection Inject into the muscle once a week. For IM use only    . azithromycin (ZITHROMAX Z-PAK) 250 MG tablet Take as directed on pack (Patient not taking: Reported on 09/08/2015) 6 tablet 0  . chlorpheniramine-HYDROcodone (TUSSIONEX PENNKINETIC ER) 10-8 MG/5ML LQCR Take 5 mLs by mouth every 12 (twelve) hours as needed for cough. (Patient not taking: Reported on 09/08/2015) 60 mL 0  . chlorproMAZINE (THORAZINE) 10 MG tablet Take 1 tablet (10 mg total) by mouth at bedtime as needed. (Patient not taking: Reported on 09/08/2015) 30 tablet 5  . fluocinonide gel (LIDEX) 0.05 % Apply topically 2 (two) times daily.    Marland Kitchen HYDROcodone-acetaminophen (NORCO) 5-325 MG per tablet Take 1 tablet by mouth every 6 (six) hours as needed. (Patient not taking: Reported on 09/08/2015) 10 tablet 0  . hydrocortisone (ANUSOL-HC) 25 MG suppository Place 25 mg rectally 2 (two) times daily.    Marland Kitchen lidocaine-prilocaine (EMLA) cream Apply topically as needed. (Patient not taking: Reported on 11/11/2014) 30 g 0   No facility-administered medications prior to visit.  Social History   Social History  . Marital Status: Significant Other    Spouse Name: N/A  . Number of Children: N/A  . Years of Education: N/A   Social History Main Topics  . Smoking status: Current Every Day Smoker -- 20.00 packs/day    Types: Cigarettes  . Smokeless tobacco: Never Used  . Alcohol Use: 1.2 oz/week    2 Standard drinks or equivalent per week  . Drug Use: No  . Sexual Activity: Yes   Other  Topics Concern  . None   Social History Narrative     Review of Systems  Constitutional: Negative for fever, chills and appetite change.  HENT: Negative for congestion, ear pain, postnasal drip, sinus pressure and sore throat.   Eyes: Negative for pain and redness.  Respiratory: Negative for cough, shortness of breath and wheezing.   Cardiovascular: Negative for leg swelling.  Gastrointestinal: Negative for nausea, vomiting, abdominal pain, diarrhea, constipation and blood in stool.  Endocrine: Negative for polyuria.  Genitourinary: Negative for dysuria, urgency, frequency and flank pain.  Musculoskeletal: Negative for gait problem.  Skin: Negative for rash.  Neurological: Negative for weakness and headaches.  Psychiatric/Behavioral: Negative for confusion and decreased concentration. The patient is not nervous/anxious.     Objective:  BP 140/88 mmHg  Pulse 113  Temp(Src) 98.7 F (37.1 C) (Oral)  Resp 18  Ht 5' 10.25" (1.784 m)  Wt 214 lb 9.6 oz (97.342 kg)  BMI 30.59 kg/m2  SpO2 98%  Physical Exam  Constitutional: He is oriented to person, place, and time. He appears well-developed and well-nourished.  HENT:  Head: Normocephalic and atraumatic.  Eyes: Conjunctivae are normal. Pupils are equal, round, and reactive to light.  Pulmonary/Chest: Effort normal.  Musculoskeletal: He exhibits no edema.       Right shoulder: He exhibits swelling.  He has an abrasion and swelling in his  left knee  Neurological: He is alert and oriented to person, place, and time.  Skin: Skin is dry.  He has an abscess localized on his left lower back  Psychiatric: He has a normal mood and affect. His behavior is normal. Thought content normal.      Assessment & Plan:   [name] was seen today for immunizations, knee pain and cyst.  Diagnoses and all orders for this visit:  Hydradenitis  Knee abrasion, left, initial encounter  Knee contusion, left, initial encounter -     DG Knee  Complete 4 Views Left; Future  Need for Tdap vaccination -     Tdap vaccine greater than or equal to 7yo IM  Needs flu shot -     Flu Vaccine QUAD 36+ mos IM  Other orders -     sulfamethoxazole-trimethoprim (BACTRIM DS,SEPTRA DS) 800-160 MG tablet; Take 2 tablets by mouth 2 (two) times daily.   I am having Mr. Hinds start on sulfamethoxazole-trimethoprim. I am also having him maintain his lisinopril-hydrochlorothiazide, omeprazole, ALPRAZolam, testosterone cypionate, clobetasol, fluocinonide gel, hydrocortisone, amphetamine-dextroamphetamine, SUMAtriptan, tadalafil, Clindamycin Phosphate, lidocaine-prilocaine, Adalimumab, HYDROcodone-acetaminophen, chlorproMAZINE, azithromycin, and chlorpheniramine-HYDROcodone.  Meds ordered this encounter  Medications  . sulfamethoxazole-trimethoprim (BACTRIM DS,SEPTRA DS) 800-160 MG tablet    Sig: Take 2 tablets by mouth 2 (two) times daily.    Dispense:  40 tablet    Refill:  0    Appropriate red flag conditions were discussed with the patient as well as actions that should be taken.  Patient expressed his understanding.  Follow-up: Return if symptoms worsen or fail to improve.  Carmelina Dane, MD   [facility] reading (PRIMARY) by  Dr. Dareen Piano.  Negative .

## 2015-09-08 NOTE — Patient Instructions (Signed)

## 2016-10-20 ENCOUNTER — Ambulatory Visit (INDEPENDENT_AMBULATORY_CARE_PROVIDER_SITE_OTHER): Payer: BLUE CROSS/BLUE SHIELD | Admitting: Family Medicine

## 2016-10-20 VITALS — BP 100/56 | HR 108 | Temp 98.3°F | Resp 16 | Ht 70.25 in | Wt 200.8 lb

## 2016-10-20 DIAGNOSIS — L309 Dermatitis, unspecified: Secondary | ICD-10-CM

## 2016-10-20 DIAGNOSIS — Z8619 Personal history of other infectious and parasitic diseases: Secondary | ICD-10-CM

## 2016-10-20 DIAGNOSIS — L732 Hidradenitis suppurativa: Secondary | ICD-10-CM | POA: Diagnosis not present

## 2016-10-20 DIAGNOSIS — R748 Abnormal levels of other serum enzymes: Secondary | ICD-10-CM | POA: Diagnosis not present

## 2016-10-20 DIAGNOSIS — B86 Scabies: Secondary | ICD-10-CM | POA: Diagnosis not present

## 2016-10-20 LAB — CBC
HEMATOCRIT: 30.5 % — AB (ref 38.5–50.0)
Hemoglobin: 10.5 g/dL — ABNORMAL LOW (ref 13.2–17.1)
MCH: 34.4 pg — AB (ref 27.0–33.0)
MCHC: 34.4 g/dL (ref 32.0–36.0)
MCV: 100 fL (ref 80.0–100.0)
MPV: 9 fL (ref 7.5–12.5)
Platelets: 175 10*3/uL (ref 140–400)
RBC: 3.05 MIL/uL — AB (ref 4.20–5.80)
RDW: 14.4 % (ref 11.0–15.0)
WBC: 9.6 10*3/uL (ref 3.8–10.8)

## 2016-10-20 LAB — COMPREHENSIVE METABOLIC PANEL
ALT: 27 U/L (ref 9–46)
AST: 79 U/L — ABNORMAL HIGH (ref 10–40)
Albumin: 3.1 g/dL — ABNORMAL LOW (ref 3.6–5.1)
Alkaline Phosphatase: 84 U/L (ref 40–115)
BILIRUBIN TOTAL: 1.8 mg/dL — AB (ref 0.2–1.2)
BUN: 12 mg/dL (ref 7–25)
CALCIUM: 8.3 mg/dL — AB (ref 8.6–10.3)
CHLORIDE: 97 mmol/L — AB (ref 98–110)
CO2: 21 mmol/L (ref 20–31)
Creat: 1.73 mg/dL — ABNORMAL HIGH (ref 0.60–1.35)
Glucose, Bld: 97 mg/dL (ref 65–99)
Potassium: 3.6 mmol/L (ref 3.5–5.3)
Sodium: 129 mmol/L — ABNORMAL LOW (ref 135–146)
Total Protein: 8 g/dL (ref 6.1–8.1)

## 2016-10-20 MED ORDER — IVERMECTIN 3 MG PO TABS: ORAL_TABLET | ORAL | 0 refills | Status: DC

## 2016-10-20 NOTE — Progress Notes (Signed)
Patient ID: Michael [name], male    DOB: 11/27/1965  Age: 50 y.o. MRN: [id number]  Chief Complaint  Patient presents with  . Scabies    all over body    Subjective:   Patient was diagnosed last week by his primary care doctor, I believe Dr. Marina [name], in [place], is having scabies. He was treated with a topical from the neck [name]. It has improved a little bit but he continues to itch terribly. He does not appear very healthy. Apparently his partner does not have any lesions. The patient has a history of hidradenitis and has cysts all over his back and axilla. He was treated elsewhere for staph by an ENT. He does drink a moderately large amount of alcohol apparently he says it makes him feel better, that he really doesn't like it. He [facility], and on his phone from his doctor in [place]. I reviewed a lot of these. He has had liver enzymes up and [name]. Once recently by his doctor in [place] lower than the ones earlier in the year at [facility].  Current allergies, medications, problem list, past/family and social histories reviewed.  Objective:  BP (!) 100/56   Pulse (!) 108   Temp 98.3 F (36.8 C) (Oral)   Resp 16   Ht 5' 10.25" (1.784 m)   Wt 200 lb 12.8 oz (91.1 kg)   SpO2 98%   BMI 28.61 kg/m   Overweight. Multiple little papular places primarily on his chest and across his back that itch. He also has a lot of cystic red areas on his back and in his axilla. His abdomen is soft with a fairly firm upper midline epigastric enlargement of his liver, and the rest of the liver border is slightly palpable and comes [name] with inspiration. He does have some lesions on his arms, between his fingers, chills. This is consistent with scabies but the hidradenitis makes it difficult to assess.  Assessment & Plan:   Assessment: 1. Dermatitis   2. Hydradenitis   3. History of staph infection   4. Scabies   5. Elevated liver enzymes       Plan: See  instructions. I talked to him about the need to discuss with his primary care doctor whether he could have liver disease going on, that might be causing a lot of his itching. Did decide to treat him with a round of ivermectin. His liver enzymes low enough that I think we can do that since it is only 1 dose. Advised strongly that he get away from alcohol.  Orders Placed This Encounter  Procedures  . Comprehensive metabolic panel  . CBC    Meds ordered this encounter  Medications  . albuterol (PROVENTIL) (2.5 MG/3ML) 0.083% nebulizer solution    Sig: Take 2.5 mg by nebulization every 6 (six) hours as needed for wheezing or shortness of breath.  . ivermectin (STROMECTOL) 3 MG TABS tablet    Sig: Take 4 pills all at once for scabies    Dispense:  4 tablet    Refill:  0         Patient Instructions   I strongly advise you to get away from regular alcohol use. You have elevated liver enzymes and an enlarged liver that may be part of why you are itching.  Take the ivermectin 4 pills at a single dose for scabies  You can retreat yourself with the topical from the neck [name] that  you were previously given.  I would advise you to get an appointment with your dermatologist if you're not feeling better over the next 5 -7days or so.  Take over-the-counter Zyrtec (cetirizine) one daily for the itching   IF you received an x-ray today, you will receive an invoice from [facility]. Please contact [facility] at [phone] with questions or concerns regarding your invoice.   IF you received labwork today, you will receive an invoice from [facility]. Please contact [facility] at [phone] with questions or concerns regarding your invoice.   Our billing staff will not be able to assist you with questions regarding bills from these companies.  You will be contacted with the lab results as soon as they are available. The fastest way to get your  results is to activate your My Chart account. Instructions are located on the last page of this paperwork. If you have not heard from us regarding the results in 2 weeks, please contact this office.        No Follow-up on file.   Ranulfo Kall, MD 10/20/2016

## 2016-10-20 NOTE — Patient Instructions (Addendum)
I strongly advise you to get away from regular alcohol use. You have elevated liver enzymes and an enlarged liver that may be part of why you are itching.  Take the ivermectin 4 pills at a single dose for scabies  You can retreat yourself with the topical from the neck down that you were previously given.  I would advise you to get an appointment with your dermatologist if you're not feeling better over the next 5 -7days or so.  Take over-the-counter Zyrtec (cetirizine) one daily for the itching   IF you received an x-ray today, you will receive an invoice from [facility]. Please contact [facility] at [phone] with questions or concerns regarding your invoice.   IF you received labwork today, you will receive an invoice from [facility]. Please contact [facility] at [phone] with questions or concerns regarding your invoice.   Our billing staff will not be able to assist you with questions regarding bills from these companies.  You will be contacted with the lab results as soon as they are available. The fastest way to get your results is to activate your My Chart account. Instructions are located on the last page of this paperwork. If you have not heard from us regarding the results in 2 weeks, please contact this office.

## 2016-10-22 ENCOUNTER — Encounter: Payer: Self-pay | Admitting: Family Medicine

## 2016-12-05 ENCOUNTER — Ambulatory Visit (INDEPENDENT_AMBULATORY_CARE_PROVIDER_SITE_OTHER): Payer: BLUE CROSS/BLUE SHIELD

## 2016-12-05 ENCOUNTER — Ambulatory Visit (INDEPENDENT_AMBULATORY_CARE_PROVIDER_SITE_OTHER): Payer: BLUE CROSS/BLUE SHIELD | Admitting: Physician Assistant

## 2016-12-05 ENCOUNTER — Encounter: Payer: Self-pay | Admitting: Physician Assistant

## 2016-12-05 VITALS — BP 118/68 | HR 107 | Temp 97.9°F | Resp 18 | Ht 70.25 in | Wt 187.0 lb

## 2016-12-05 DIAGNOSIS — R059 Cough, unspecified: Secondary | ICD-10-CM

## 2016-12-05 DIAGNOSIS — R05 Cough: Secondary | ICD-10-CM

## 2016-12-05 DIAGNOSIS — F17210 Nicotine dependence, cigarettes, uncomplicated: Secondary | ICD-10-CM | POA: Diagnosis not present

## 2016-12-05 LAB — POCT CBC
GRANULOCYTE PERCENT: 81 % — AB (ref 37–80)
HEMATOCRIT: 36.5 % — AB (ref 43.5–53.7)
HEMOGLOBIN: 12.8 g/dL — AB (ref 14.1–18.1)
LYMPH, POC: 1.6 (ref 0.6–3.4)
MCH, POC: 33.7 pg — AB (ref 27–31.2)
MCHC: 35 g/dL (ref 31.8–35.4)
MCV: 96.1 fL (ref 80–97)
MID (cbc): 0.7 (ref 0–0.9)
MPV: 5.6 fL (ref 0–99.8)
POC GRANULOCYTE: 9.6 — AB (ref 2–6.9)
POC LYMPH PERCENT: 13.2 %L (ref 10–50)
POC MID %: 5.8 % (ref 0–12)
Platelet Count, POC: 293 10*3/uL (ref 142–424)
RBC: 3.8 M/uL — AB (ref 4.69–6.13)
RDW, POC: 14.6 %
WBC: 11.9 10*3/uL — AB (ref 4.6–10.2)

## 2016-12-05 MED ORDER — PREDNISONE 20 MG PO TABS: 40.0000 mg | ORAL_TABLET | Freq: Every day | ORAL | 0 refills | Status: DC

## 2016-12-05 MED ORDER — DOXYCYCLINE HYCLATE 100 MG PO CAPS: 100.0000 mg | ORAL_CAPSULE | Freq: Two times a day (BID) | ORAL | 0 refills | Status: AC

## 2016-12-05 NOTE — Progress Notes (Signed)
12/05/2016 4:00 PM   DOB: [birth year] / MRN: [id number]  SUBJECTIVE:  Michael [name] is a 51 y.o. male presenting for sinusitis that started on December 30th.  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  Is taking lots of OTC with little relief.  Has tried afrin and is aware not to use this for more than three days. Denies fever. He feels that he is not improving.  He a thirty pack year history of smoking and a history asthma but did not have this as a child.   He is allergic to penicillins and latex.   He  has a past medical history of Allergy; Anxiety; Arthritis; Asthma; Depression; Gastritis; Hypertension; Insomnia; MRSA (methicillin resistant Staphylococcus aureus) infection; Skin abscess; and Sleep apnea.    He  reports that he has been smoking Cigarettes.  He has a 45.00 pack-year smoking history. He has never used smokeless tobacco. He reports that he drinks about 1.2 oz of alcohol per week . He reports that he does not use drugs. He  reports that he currently engages in sexual activity. The patient  has a past surgical history that includes Colon resection (1989); left arm surgery; Hernia repair; Fracture surgery; Small intestine surgery; and Eye surgery.  His family history includes Alcohol abuse in his father; Arthritis in his mother; Diabetes in his maternal grandfather, maternal grandmother, and mother; Heart disease in his maternal grandfather and maternal grandmother; Hyperlipidemia in his maternal grandmother; Hypertension in his maternal grandfather, maternal grandmother, and mother.  Review of Systems  Constitutional: Negative for chills, fever, malaise/fatigue and weight loss.  Respiratory: Positive for cough and sputum production. Negative for hemoptysis, shortness of breath and wheezing.   Cardiovascular: Negative for chest pain, palpitations and leg swelling.  Gastrointestinal: Negative for nausea.  Neurological: Negative for dizziness.    The problem list and  medications were reviewed and updated by myself where necessary and exist elsewhere in the encounter.   OBJECTIVE:  BP 118/68   Pulse (!) 107   Temp 97.9 F (36.6 C) (Oral)   Resp 18   Ht 5' 10.25" (1.784 m)   Wt 187 lb (84.8 kg)   SpO2 100%   BMI 26.64 kg/m   Physical Exam  Constitutional: He is oriented to person, place, and time.  Cardiovascular: Normal rate and regular rhythm.   Pulmonary/Chest: Effort normal. No respiratory distress. He has no wheezes. He has no rales. He exhibits no tenderness.  Musculoskeletal: Normal range of motion.  Neurological: He is alert and oriented to person, place, and time.  Skin: Skin is warm and dry. He is not diaphoretic.  Psychiatric: He has a normal mood and affect.    Results for orders placed or performed in visit on 12/05/16 (from the past 72 hour(s))  POCT CBC     Status: Abnormal   Collection Time: 12/05/16  3:37 PM  Result Value Ref Range   WBC 11.9 (A) 4.6 - 10.2 K/uL   Lymph, poc 1.6 0.6 - 3.4   POC LYMPH PERCENT 13.2 10 - 50 %L   MID (cbc) 0.7 0 - 0.9   POC MID % 5.8 0 - 12 %M   POC Granulocyte 9.6 (A) 2 - 6.9   Granulocyte percent 81.0 (A) 37 - 80 %G   RBC 3.80 (A) 4.69 - 6.13 M/uL   Hemoglobin 12.8 (A) 14.1 - 18.1 g/dL   HCT, POC 40.9 (A) 81.1 - 53.7 %   MCV 96.1 80 - 97 fL  MCH, POC 33.7 (A) 27 - 31.2 pg   MCHC 35.0 31.8 - 35.4 g/dL   RDW, POC 16.114.6 %   Platelet Count, POC 293 142 - 424 K/uL   MPV 5.6 0 - 99.8 fL    Dg Chest 2 View  Result Date: 12/05/2016 CLINICAL DATA:  Cough for 2 weeks EXAM: CHEST  2 VIEW COMPARISON:  02/26/2015 FINDINGS: The heart size and mediastinal contours are within normal limits. Both lungs are clear. The visualized skeletal structures are unremarkable. IMPRESSION: No active cardiopulmonary disease. Electronically Signed   By: Jasmine PangKim  Fujinaga M.D.   On: 12/05/2016 15:35    ASSESSMENT AND PLAN:  Michael [name] was seen today for sinusitis, nasal congestion and cough.  Diagnoses and all orders  for this visit:  Cough: I am shocked he does not have any hyperexpansion. Given problem 2 and the leukocytosis I will cover with doxy which should treat an atypical and a sinusitis.  Short course and low dose of pred if he is not improving or getting better in a few days. A1c normal.  -     POCT CBC -     DG Chest 2 View; Future -     Hemoglobin A1C -     doxycycline (VIBRAMYCIN) 100 MG capsule; Take 1 capsule (100 mg total) by mouth 2 (two) times daily. -     predniSONE (DELTASONE) 20 MG tablet; Take 2 tablets (40 mg total) by mouth daily with breakfast.  Smokes with greater than 30 pack year history: See problem one.     The patient is advised to call or return to clinic if he does not see an improvement in symptoms, or to seek the care of the closest emergency department if he worsens with the above plan.   Deliah BostonMichael Clark, MHS, PA-C [facility] 12/05/2016 4:00 PM

## 2016-12-05 NOTE — Patient Instructions (Signed)
     IF you received an x-ray today, you will receive an invoice from [facility]. Please contact [facility] at [phone] with questions or concerns regarding your invoice.   IF you received labwork today, you will receive an invoice from [facility]. Please contact [facility] at [phone] with questions or concerns regarding your invoice.   Our billing staff will not be able to assist you with questions regarding bills from these companies.  You will be contacted with the lab results as soon as they are available. The fastest way to get your results is to activate your My Chart account. Instructions are located on the last page of this paperwork. If you have not heard from us regarding the results in 2 weeks, please contact this office.     

## 2016-12-06 LAB — HEMOGLOBIN A1C
Est. average glucose Bld gHb Est-mCnc: 103 mg/dL
Hgb A1c MFr Bld: 5.2 % (ref 4.8–5.6)

## 2016-12-07 ENCOUNTER — Encounter: Payer: Self-pay | Admitting: *Deleted

## 2016-12-20 ENCOUNTER — Ambulatory Visit (INDEPENDENT_AMBULATORY_CARE_PROVIDER_SITE_OTHER): Payer: BLUE CROSS/BLUE SHIELD | Admitting: Physician Assistant

## 2016-12-20 VITALS — BP 128/64 | HR 108 | Temp 98.0°F | Resp 16 | Ht 71.0 in | Wt 194.2 lb

## 2016-12-20 DIAGNOSIS — L0231 Cutaneous abscess of buttock: Secondary | ICD-10-CM

## 2016-12-20 MED ORDER — DOXYCYCLINE HYCLATE 100 MG PO TABS: 100.0000 mg | ORAL_TABLET | Freq: Two times a day (BID) | ORAL | 0 refills | Status: DC

## 2016-12-20 NOTE — Progress Notes (Signed)
Michael RhymesDonald [name]  MRN: [id number] DOB: 05/03/1966  Subjective:  Pt presents to clinic with an abscess for the last week on his right buttocks.  It has started to drain but he is concerned that it might come back if it is not opened.  He has significant history of recurrent abscesses due to hydradenitis suppurativa that he is currently on Humira for which has helped a lot.  He has used warm compresses and it has drained a lot of thick pus.  Review of Systems  Constitutional: Negative for chills and fever.    Patient Active Problem List   Diagnosis Date Noted  . Hydradenitis 09/08/2015  . Obstructive sleep apnea 02/26/2015  . Hidradenitis 02/26/2015  . Hypogonadism male 02/26/2015  . ADD (attention deficit disorder) 02/26/2015  . GERD (gastroesophageal reflux disease) 02/26/2015    Current Outpatient Prescriptions on File Prior to Visit  Medication Sig Dispense Refill  . Adalimumab (HUMIRA) 40 MG/0.8ML PSKT Inject into the skin.    Marland Kitchen. albuterol (PROVENTIL) (2.5 MG/3ML) 0.083% nebulizer solution Take 2.5 mg by nebulization every 6 (six) hours as needed for wheezing or shortness of breath.    . ALPRAZolam (XANAX) 1 MG tablet Take 1 mg by mouth at bedtime as needed.    Marland Kitchen. amphetamine-dextroamphetamine (ADDERALL) 20 MG tablet Take 20 mg by mouth 2 (two) times daily.    . Clindamycin Phosphate foam Apply topically 2 (two) times daily.    . clobetasol (OLUX) 0.05 % topical foam Apply topically 2 (two) times daily.    . diphenhydrAMINE (BENADRYL) 50 MG capsule Take 50 mg by mouth at bedtime.    Marland Kitchen. lisinopril-hydrochlorothiazide (PRINZIDE,ZESTORETIC) 20-12.5 MG per tablet Take 1 tablet by mouth 2 (two) times daily.    Marland Kitchen. omeprazole (PRILOSEC) 20 MG capsule Take 20 mg by mouth daily.    Marland Kitchen. testosterone cypionate (DEPOTESTOTERONE CYPIONATE) 100 MG/ML injection Inject into the muscle once a week. For IM use only    . UNABLE TO FIND Triamcinolone cream/ silver sulfa compound    . Dextromethorphan HBr  (COUGH RELIEF PO) Take by mouth.    . guaifenesin (RA MUCUS RELIEF CHEST) 400 MG TABS tablet Take 400 mg by mouth every 4 (four) hours.    . predniSONE (DELTASONE) 20 MG tablet Take 2 tablets (40 mg total) by mouth daily with breakfast. (Patient not taking: Reported on 12/20/2016) 8 tablet 0  . SUMAtriptan (IMITREX) 6 MG/0.5ML SOLN injection Inject 6 mg into the skin every 2 (two) hours as needed. F    . tadalafil (CIALIS) 20 MG tablet Take 20 mg by mouth daily as needed.     No current facility-administered medications on file prior to visit.     Allergies  Allergen Reactions  . Penicillins Rash  . Latex     Pt patients past, family and social history were reviewed and updated.   Objective:  BP 128/64 (BP Location: Right Arm, Patient Position: Sitting, Cuff Size: Normal)   Pulse (!) 108   Temp 98 F (36.7 C) (Oral)   Resp 16   Ht 5\' 11"  (1.803 m)   Wt 194 lb 3.2 oz (88.1 kg)   SpO2 99%   BMI 27.09 kg/m   Physical Exam  Constitutional: He is oriented to person, place, and time and well-developed, well-nourished, and in no distress.  HENT:  Head: Normocephalic and atraumatic.  Right Ear: External ear normal.  Left Ear: External ear normal.  Eyes: Conjunctivae are normal.  Neck: Normal range of motion.  Pulmonary/Chest: Effort normal.  Neurological: He is alert and oriented to person, place, and time. Gait normal.  Skin: Skin is warm and dry.  Scarred skin from multiple abscesses - several abscesses on back which are erythematous and indurated but no fluctuance.  Right buttocks with 2x3cm indurated area with surrounding erythema and central fluctuance with 3 pores that drain when the area is palpated.  Psychiatric: Mood, memory, affect and judgment normal.   Procedure: Consent obtained - local anesthesia with 1% lido with epi - skin cleaned and #11 blade used to make a 2 cm vertical incision - minimal purulence expressed - packed with 1/4 in plain packing - dressing  placed. Assessment and Plan :  Abscess of buttock - Plan: doxycycline (VIBRA-TABS) 100 MG tablet - keep covered while draining - remove packing in 3 days - no f/u needed unless he has problems.  Benny Lennert PA-C  [facility] 12/20/2016 1:06 PM

## 2016-12-27 ENCOUNTER — Telehealth (INDEPENDENT_AMBULATORY_CARE_PROVIDER_SITE_OTHER): Payer: Self-pay | Admitting: Specialist

## 2016-12-27 NOTE — Telephone Encounter (Signed)
Patient called stating that his mother had talked to you about his feet and toes going numb.  He wants to know if you can fit him into Dr. Barbaraann FasterNitka's schedule.  [phone]#[phone]. Thank you.

## 2017-01-04 ENCOUNTER — Telehealth (INDEPENDENT_AMBULATORY_CARE_PROVIDER_SITE_OTHER): Payer: Self-pay | Admitting: *Deleted

## 2017-01-04 NOTE — Telephone Encounter (Signed)
Pt called about the numbness in his toes. Scheduled him in march.

## 2017-01-07 ENCOUNTER — Encounter (INDEPENDENT_AMBULATORY_CARE_PROVIDER_SITE_OTHER): Payer: Self-pay | Admitting: Surgery

## 2017-01-07 ENCOUNTER — Ambulatory Visit (INDEPENDENT_AMBULATORY_CARE_PROVIDER_SITE_OTHER): Payer: Self-pay

## 2017-01-07 ENCOUNTER — Ambulatory Visit (INDEPENDENT_AMBULATORY_CARE_PROVIDER_SITE_OTHER): Payer: BLUE CROSS/BLUE SHIELD | Admitting: Surgery

## 2017-01-07 VITALS — BP 121/72 | HR 108 | Ht 71.0 in | Wt 200.0 lb

## 2017-01-07 DIAGNOSIS — G8929 Other chronic pain: Secondary | ICD-10-CM

## 2017-01-07 DIAGNOSIS — M545 Low back pain: Secondary | ICD-10-CM | POA: Diagnosis not present

## 2017-01-07 DIAGNOSIS — M4726 Other spondylosis with radiculopathy, lumbar region: Secondary | ICD-10-CM

## 2017-01-07 NOTE — Telephone Encounter (Signed)
Patient was seen today by James.

## 2017-01-07 NOTE — Telephone Encounter (Signed)
Patient was seen by Fayrene FearingJames today
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
## 2017-01-14 ENCOUNTER — Ambulatory Visit (HOSPITAL_COMMUNITY): Admission: RE | Admit: 2017-01-14 | Payer: BLUE CROSS/BLUE SHIELD | Source: Ambulatory Visit

## 2017-01-21 ENCOUNTER — Emergency Department (HOSPITAL_COMMUNITY): Payer: BLUE CROSS/BLUE SHIELD

## 2017-01-21 ENCOUNTER — Inpatient Hospital Stay (HOSPITAL_COMMUNITY)
Admission: EM | Admit: 2017-01-21 | Discharge: 2017-01-24 | DRG: 369 | Disposition: A | Discharge status: Home / Self Care | Payer: BLUE CROSS/BLUE SHIELD | Attending: Internal Medicine | Admitting: Internal Medicine

## 2017-01-21 ENCOUNTER — Encounter (HOSPITAL_COMMUNITY): Payer: Self-pay

## 2017-01-21 DIAGNOSIS — K7030 Alcoholic cirrhosis of liver without ascites: Secondary | ICD-10-CM | POA: Diagnosis present

## 2017-01-21 DIAGNOSIS — K766 Portal hypertension: Secondary | ICD-10-CM | POA: Diagnosis present

## 2017-01-21 DIAGNOSIS — Z811 Family history of alcohol abuse and dependence: Secondary | ICD-10-CM

## 2017-01-21 DIAGNOSIS — Z87891 Personal history of nicotine dependence: Secondary | ICD-10-CM

## 2017-01-21 DIAGNOSIS — F10930 Alcohol use, unspecified with withdrawal, uncomplicated: Secondary | ICD-10-CM

## 2017-01-21 DIAGNOSIS — K922 Gastrointestinal hemorrhage, unspecified: Secondary | ICD-10-CM | POA: Diagnosis present

## 2017-01-21 DIAGNOSIS — I10 Essential (primary) hypertension: Secondary | ICD-10-CM | POA: Diagnosis present

## 2017-01-21 DIAGNOSIS — I8500 Esophageal varices without bleeding: Secondary | ICD-10-CM | POA: Diagnosis present

## 2017-01-21 DIAGNOSIS — K2900 Acute gastritis without bleeding: Secondary | ICD-10-CM | POA: Diagnosis present

## 2017-01-21 DIAGNOSIS — F1010 Alcohol abuse, uncomplicated: Secondary | ICD-10-CM | POA: Diagnosis not present

## 2017-01-21 DIAGNOSIS — D62 Acute posthemorrhagic anemia: Secondary | ICD-10-CM | POA: Diagnosis present

## 2017-01-21 DIAGNOSIS — Z880 Allergy status to penicillin: Secondary | ICD-10-CM

## 2017-01-21 DIAGNOSIS — K920 Hematemesis: Secondary | ICD-10-CM

## 2017-01-21 DIAGNOSIS — F17210 Nicotine dependence, cigarettes, uncomplicated: Secondary | ICD-10-CM | POA: Diagnosis present

## 2017-01-21 DIAGNOSIS — K226 Gastro-esophageal laceration-hemorrhage syndrome: Principal | ICD-10-CM | POA: Diagnosis present

## 2017-01-21 DIAGNOSIS — J45909 Unspecified asthma, uncomplicated: Secondary | ICD-10-CM | POA: Diagnosis present

## 2017-01-21 DIAGNOSIS — Z91040 Latex allergy status: Secondary | ICD-10-CM

## 2017-01-21 DIAGNOSIS — Z8261 Family history of arthritis: Secondary | ICD-10-CM

## 2017-01-21 DIAGNOSIS — Z8719 Personal history of other diseases of the digestive system: Secondary | ICD-10-CM

## 2017-01-21 DIAGNOSIS — K219 Gastro-esophageal reflux disease without esophagitis: Secondary | ICD-10-CM | POA: Diagnosis present

## 2017-01-21 DIAGNOSIS — Z79899 Other long term (current) drug therapy: Secondary | ICD-10-CM

## 2017-01-21 DIAGNOSIS — R066 Hiccough: Secondary | ICD-10-CM | POA: Diagnosis present

## 2017-01-21 DIAGNOSIS — R000 Tachycardia, unspecified: Secondary | ICD-10-CM | POA: Diagnosis present

## 2017-01-21 DIAGNOSIS — L732 Hidradenitis suppurativa: Secondary | ICD-10-CM | POA: Diagnosis present

## 2017-01-21 DIAGNOSIS — F10230 Alcohol dependence with withdrawal, uncomplicated: Secondary | ICD-10-CM

## 2017-01-21 DIAGNOSIS — Y902 Blood alcohol level of 40-59 mg/100 ml: Secondary | ICD-10-CM | POA: Diagnosis present

## 2017-01-21 DIAGNOSIS — K3189 Other diseases of stomach and duodenum: Secondary | ICD-10-CM | POA: Diagnosis present

## 2017-01-21 DIAGNOSIS — Z833 Family history of diabetes mellitus: Secondary | ICD-10-CM

## 2017-01-21 DIAGNOSIS — Z8249 Family history of ischemic heart disease and other diseases of the circulatory system: Secondary | ICD-10-CM

## 2017-01-21 DIAGNOSIS — Z9049 Acquired absence of other specified parts of digestive tract: Secondary | ICD-10-CM

## 2017-01-21 LAB — COMPREHENSIVE METABOLIC PANEL
ALK PHOS: 63 U/L (ref 38–126)
ALT: 21 U/L (ref 17–63)
ANION GAP: 11 (ref 5–15)
AST: 49 U/L — ABNORMAL HIGH (ref 15–41)
Albumin: 3.3 g/dL — ABNORMAL LOW (ref 3.5–5.0)
BUN: 26 mg/dL — ABNORMAL HIGH (ref 6–20)
CALCIUM: 9.3 mg/dL (ref 8.9–10.3)
CHLORIDE: 104 mmol/L (ref 101–111)
CO2: 20 mmol/L — ABNORMAL LOW (ref 22–32)
Creatinine, Ser: 0.99 mg/dL (ref 0.61–1.24)
GFR calc non Af Amer: >60 mL/min (ref >60)
Glucose, Bld: 96 mg/dL (ref 65–99)
POTASSIUM: 4.5 mmol/L (ref 3.5–5.1)
SODIUM: 135 mmol/L (ref 135–145)
Total Bilirubin: 1.2 mg/dL (ref 0.3–1.2)
Total Protein: 7.5 g/dL (ref 6.5–8.1)

## 2017-01-21 LAB — ETHANOL: Alcohol, Ethyl (B): 41 mg/dL — ABNORMAL HIGH (ref <5)

## 2017-01-21 LAB — CBC
HEMATOCRIT: 32.1 % — AB (ref 39.0–52.0)
HEMOGLOBIN: 11 g/dL — AB (ref 13.0–17.0)
MCH: 31.3 pg (ref 26.0–34.0)
MCHC: 34.3 g/dL (ref 30.0–36.0)
MCV: 91.5 fL (ref 78.0–100.0)
Platelets: 259 10*3/uL (ref 150–400)
RBC: 3.51 MIL/uL — AB (ref 4.22–5.81)
RDW: 14.5 % (ref 11.5–15.5)
WBC: 16.3 10*3/uL — ABNORMAL HIGH (ref 4.0–10.5)

## 2017-01-21 LAB — PROTIME-INR
INR: 1.66
PROTHROMBIN TIME: 19.8 s — AB (ref 11.4–15.2)

## 2017-01-21 LAB — POC OCCULT BLOOD, ED: FECAL OCCULT BLD: POSITIVE — AB

## 2017-01-21 LAB — ABO/RH: ABO/RH(D): O POS

## 2017-01-21 MED ORDER — LORAZEPAM 2 MG/ML IJ SOLN
1.0000 mg | Freq: Once | INTRAMUSCULAR | Status: AC
  Administered 2017-01-21: 1 mg via INTRAVENOUS
  Filled 2017-01-21: qty 1

## 2017-01-21 MED ORDER — SODIUM CHLORIDE 0.9 % IV SOLN
INTRAVENOUS | Status: DC
  Administered 2017-01-21 – 2017-01-23 (×4): via INTRAVENOUS

## 2017-01-21 MED ORDER — OCTREOTIDE LOAD VIA INFUSION
50.0000 ug | Freq: Once | INTRAVENOUS | Status: AC
  Administered 2017-01-22: 50 ug via INTRAVENOUS
  Filled 2017-01-21: qty 25

## 2017-01-21 MED ORDER — SODIUM CHLORIDE 0.9 % IV BOLUS (SEPSIS)
1000.0000 mL | Freq: Once | INTRAVENOUS | Status: AC
  Administered 2017-01-21: 1000 mL via INTRAVENOUS

## 2017-01-21 MED ORDER — PANTOPRAZOLE SODIUM 40 MG IV SOLR
40.0000 mg | Freq: Once | INTRAVENOUS | Status: AC
  Administered 2017-01-21: 40 mg via INTRAVENOUS
  Filled 2017-01-21: qty 40

## 2017-01-21 MED ORDER — ONDANSETRON HCL 4 MG/2ML IJ SOLN
4.0000 mg | Freq: Once | INTRAMUSCULAR | Status: AC
Start: 2017-01-21 — End: 2017-01-21
  Administered 2017-01-21: 4 mg via INTRAVENOUS
  Filled 2017-01-21: qty 2

## 2017-01-21 MED ORDER — SODIUM CHLORIDE 0.9 % IV SOLN
50.0000 ug/h | INTRAVENOUS | Status: DC
  Administered 2017-01-22 (×3): 50 ug/h via INTRAVENOUS
  Filled 2017-01-21 (×7): qty 1

## 2017-01-21 MED ORDER — LORAZEPAM 2 MG/ML IJ SOLN
1.0000 mg | Freq: Once | INTRAMUSCULAR | Status: AC
  Administered 2017-01-22: 1 mg via INTRAVENOUS
  Filled 2017-01-21: qty 1

## 2017-01-21 MED ORDER — SODIUM CHLORIDE 0.9 % IV BOLUS (SEPSIS)
1000.0000 mL | Freq: Once | INTRAVENOUS | Status: AC
  Administered 2017-01-22: 1000 mL via INTRAVENOUS
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
## 2017-01-21 NOTE — ED Triage Notes (Signed)
PT C/O VOMITING BLOOD SINCE 1200 NOON AND HAVING BLACK STOOLS SINCE 5 PM TODAY. PT STS HE WAS CLEANING HIS BATHROOM AT MIDNIGHT WITH LYSOL AND KABOOM, AND HE FORGOT TO TURN HIS FAN ON AND WENT TO BED. HE WOKE UP FEELING SICK AND BEGAN TO VOMIT. PT DENIES BLOOD THINNER OR ASPIRIN USE. PT HR 145 CIN TRIAGE.

## 2017-01-22 ENCOUNTER — Encounter (HOSPITAL_COMMUNITY)
Admission: EM | Disposition: A | Discharge status: Home / Self Care | Payer: Self-pay | Source: Home / Self Care | Attending: Internal Medicine

## 2017-01-22 ENCOUNTER — Inpatient Hospital Stay (HOSPITAL_COMMUNITY): Payer: BLUE CROSS/BLUE SHIELD | Admitting: Anesthesiology

## 2017-01-22 ENCOUNTER — Encounter (HOSPITAL_COMMUNITY): Payer: Self-pay | Admitting: Anesthesiology

## 2017-01-22 DIAGNOSIS — F1010 Alcohol abuse, uncomplicated: Secondary | ICD-10-CM | POA: Diagnosis present

## 2017-01-22 DIAGNOSIS — Z79899 Other long term (current) drug therapy: Secondary | ICD-10-CM | POA: Diagnosis not present

## 2017-01-22 DIAGNOSIS — K922 Gastrointestinal hemorrhage, unspecified: Secondary | ICD-10-CM | POA: Diagnosis present

## 2017-01-22 DIAGNOSIS — D62 Acute posthemorrhagic anemia: Secondary | ICD-10-CM | POA: Diagnosis present

## 2017-01-22 DIAGNOSIS — R066 Hiccough: Secondary | ICD-10-CM | POA: Diagnosis present

## 2017-01-22 DIAGNOSIS — F17210 Nicotine dependence, cigarettes, uncomplicated: Secondary | ICD-10-CM | POA: Diagnosis present

## 2017-01-22 DIAGNOSIS — Z8249 Family history of ischemic heart disease and other diseases of the circulatory system: Secondary | ICD-10-CM | POA: Diagnosis not present

## 2017-01-22 DIAGNOSIS — I10 Essential (primary) hypertension: Secondary | ICD-10-CM | POA: Diagnosis present

## 2017-01-22 DIAGNOSIS — K766 Portal hypertension: Secondary | ICD-10-CM | POA: Diagnosis present

## 2017-01-22 DIAGNOSIS — Z8261 Family history of arthritis: Secondary | ICD-10-CM | POA: Diagnosis not present

## 2017-01-22 DIAGNOSIS — L732 Hidradenitis suppurativa: Secondary | ICD-10-CM

## 2017-01-22 DIAGNOSIS — Z811 Family history of alcohol abuse and dependence: Secondary | ICD-10-CM | POA: Diagnosis not present

## 2017-01-22 DIAGNOSIS — Z833 Family history of diabetes mellitus: Secondary | ICD-10-CM | POA: Diagnosis not present

## 2017-01-22 DIAGNOSIS — K2900 Acute gastritis without bleeding: Secondary | ICD-10-CM | POA: Diagnosis present

## 2017-01-22 DIAGNOSIS — K920 Hematemesis: Secondary | ICD-10-CM | POA: Diagnosis present

## 2017-01-22 DIAGNOSIS — Z9049 Acquired absence of other specified parts of digestive tract: Secondary | ICD-10-CM | POA: Diagnosis not present

## 2017-01-22 DIAGNOSIS — K3189 Other diseases of stomach and duodenum: Secondary | ICD-10-CM | POA: Diagnosis present

## 2017-01-22 DIAGNOSIS — J45909 Unspecified asthma, uncomplicated: Secondary | ICD-10-CM | POA: Diagnosis present

## 2017-01-22 DIAGNOSIS — K219 Gastro-esophageal reflux disease without esophagitis: Secondary | ICD-10-CM

## 2017-01-22 DIAGNOSIS — Z880 Allergy status to penicillin: Secondary | ICD-10-CM | POA: Diagnosis not present

## 2017-01-22 DIAGNOSIS — R000 Tachycardia, unspecified: Secondary | ICD-10-CM | POA: Diagnosis present

## 2017-01-22 DIAGNOSIS — K7030 Alcoholic cirrhosis of liver without ascites: Secondary | ICD-10-CM | POA: Diagnosis present

## 2017-01-22 DIAGNOSIS — Z91040 Latex allergy status: Secondary | ICD-10-CM | POA: Diagnosis not present

## 2017-01-22 DIAGNOSIS — Z8719 Personal history of other diseases of the digestive system: Secondary | ICD-10-CM | POA: Diagnosis not present

## 2017-01-22 DIAGNOSIS — Z87891 Personal history of nicotine dependence: Secondary | ICD-10-CM | POA: Diagnosis not present

## 2017-01-22 DIAGNOSIS — Y902 Blood alcohol level of 40-59 mg/100 ml: Secondary | ICD-10-CM | POA: Diagnosis present

## 2017-01-22 DIAGNOSIS — K226 Gastro-esophageal laceration-hemorrhage syndrome: Secondary | ICD-10-CM | POA: Diagnosis present

## 2017-01-22 DIAGNOSIS — I8500 Esophageal varices without bleeding: Secondary | ICD-10-CM | POA: Diagnosis present

## 2017-01-22 HISTORY — PX: ESOPHAGOGASTRODUODENOSCOPY (EGD) WITH PROPOFOL: SHX5813

## 2017-01-22 LAB — CBC
HCT: 23.4 % — ABNORMAL LOW (ref 39.0–52.0)
HCT: 26.4 % — ABNORMAL LOW (ref 39.0–52.0)
Hemoglobin: 8 g/dL — ABNORMAL LOW (ref 13.0–17.0)
Hemoglobin: 9 g/dL — ABNORMAL LOW (ref 13.0–17.0)
MCH: 31.1 pg (ref 26.0–34.0)
MCH: 31.5 pg (ref 26.0–34.0)
MCHC: 34.2 g/dL (ref 30.0–36.0)
MCHC: 34.1 g/dL (ref 30.0–36.0)
MCV: 91.1 fL (ref 78.0–100.0)
MCV: 92.3 fL (ref 78.0–100.0)
PLATELETS: 119 10*3/uL — AB (ref 150–400)
PLATELETS: 127 10*3/uL — AB (ref 150–400)
RBC: 2.57 MIL/uL — ABNORMAL LOW (ref 4.22–5.81)
RBC: 2.86 MIL/uL — ABNORMAL LOW (ref 4.22–5.81)
RDW: 14.6 % (ref 11.5–15.5)
RDW: 16.2 % — AB (ref 11.5–15.5)
WBC: 6.6 10*3/uL (ref 4.0–10.5)
WBC: 6.2 10*3/uL (ref 4.0–10.5)

## 2017-01-22 LAB — HEMOGLOBIN AND HEMATOCRIT, BLOOD
HCT: 29 % — ABNORMAL LOW (ref 39.0–52.0)
HEMOGLOBIN: 10 g/dL — AB (ref 13.0–17.0)

## 2017-01-22 LAB — BASIC METABOLIC PANEL
Anion gap: 8 (ref 5–15)
BUN: 26 mg/dL — AB (ref 6–20)
CALCIUM: 8.4 mg/dL — AB (ref 8.9–10.3)
CO2: 21 mmol/L — AB (ref 22–32)
CREATININE: 0.9 mg/dL (ref 0.61–1.24)
Chloride: 108 mmol/L (ref 101–111)
GFR calc non Af Amer: >60 mL/min (ref >60)
GLUCOSE: 98 mg/dL (ref 65–99)
Potassium: 5 mmol/L (ref 3.5–5.1)
Sodium: 137 mmol/L (ref 135–145)

## 2017-01-22 LAB — MRSA PCR SCREENING: MRSA by PCR: NEGATIVE

## 2017-01-22 LAB — PREPARE RBC (CROSSMATCH)

## 2017-01-22 SURGERY — ESOPHAGOGASTRODUODENOSCOPY (EGD) WITH PROPOFOL
Anesthesia: Monitor Anesthesia Care

## 2017-01-22 MED ORDER — TRIAMCINOLONE ACETONIDE 0.1 % EX OINT
1.0000 "application " | TOPICAL_OINTMENT | Freq: Two times a day (BID) | CUTANEOUS | Status: DC
  Administered 2017-01-22 – 2017-01-24 (×4): 1 via TOPICAL
  Filled 2017-01-22: qty 15

## 2017-01-22 MED ORDER — PHENYLEPHRINE 40 MCG/ML (10ML) SYRINGE FOR IV PUSH (FOR BLOOD PRESSURE SUPPORT)
PREFILLED_SYRINGE | INTRAVENOUS | Status: AC
  Filled 2017-01-22: qty 10

## 2017-01-22 MED ORDER — PHENYLEPHRINE 40 MCG/ML (10ML) SYRINGE FOR IV PUSH (FOR BLOOD PRESSURE SUPPORT)
PREFILLED_SYRINGE | INTRAVENOUS | Status: DC | PRN
  Administered 2017-01-22 (×3): 80 ug via INTRAVENOUS

## 2017-01-22 MED ORDER — ONDANSETRON HCL 4 MG/2ML IJ SOLN: 4.0000 mg | Freq: Four times a day (QID) | INTRAMUSCULAR | Status: DC | PRN

## 2017-01-22 MED ORDER — LIDOCAINE 2% (20 MG/ML) 5 ML SYRINGE
INTRAMUSCULAR | Status: DC | PRN
  Administered 2017-01-22: 100 mg via INTRAVENOUS

## 2017-01-22 MED ORDER — SODIUM CHLORIDE 0.9 % IV SOLN: Freq: Once | INTRAVENOUS | Status: DC

## 2017-01-22 MED ORDER — SODIUM CHLORIDE 0.9 % IV SOLN: INTRAVENOUS | Status: DC

## 2017-01-22 MED ORDER — LORAZEPAM 2 MG/ML IJ SOLN
0.0000 mg | Freq: Four times a day (QID) | INTRAMUSCULAR | Status: AC
  Administered 2017-01-22: 1 mg via INTRAVENOUS
  Administered 2017-01-22 – 2017-01-23 (×3): 2 mg via INTRAVENOUS
  Administered 2017-01-23 (×2): 4 mg via INTRAVENOUS
  Administered 2017-01-23: 2 mg via INTRAVENOUS
  Filled 2017-01-22: qty 1
  Filled 2017-01-22: qty 2
  Filled 2017-01-22 (×2): qty 1
  Filled 2017-01-22: qty 2
  Filled 2017-01-22 (×4): qty 1

## 2017-01-22 MED ORDER — SODIUM CHLORIDE 0.9 % IV SOLN
8.0000 mg/h | INTRAVENOUS | Status: DC
  Administered 2017-01-22 (×3): 8 mg/h via INTRAVENOUS
  Filled 2017-01-22 (×7): qty 80

## 2017-01-22 MED ORDER — DIPHENHYDRAMINE-ZINC ACETATE 2-0.1 % EX CREA
TOPICAL_CREAM | Freq: Three times a day (TID) | CUTANEOUS | Status: DC | PRN
  Administered 2017-01-22 (×3): via TOPICAL
  Filled 2017-01-22 (×2): qty 28

## 2017-01-22 MED ORDER — THIAMINE HCL 100 MG/ML IJ SOLN
100.0000 mg | Freq: Every day | INTRAMUSCULAR | Status: DC
  Administered 2017-01-22: 100 mg via INTRAVENOUS
  Filled 2017-01-22: qty 2

## 2017-01-22 MED ORDER — LORAZEPAM 2 MG/ML IJ SOLN
1.0000 mg | Freq: Four times a day (QID) | INTRAMUSCULAR | Status: DC | PRN
  Administered 2017-01-22 – 2017-01-24 (×5): 1 mg via INTRAVENOUS
  Filled 2017-01-22 (×4): qty 1

## 2017-01-22 MED ORDER — LORAZEPAM 2 MG/ML IJ SOLN
0.0000 mg | Freq: Two times a day (BID) | INTRAMUSCULAR | Status: DC
  Administered 2017-01-24: 2 mg via INTRAVENOUS
  Filled 2017-01-22: qty 1

## 2017-01-22 MED ORDER — LORAZEPAM 1 MG PO TABS
1.0000 mg | ORAL_TABLET | Freq: Four times a day (QID) | ORAL | Status: DC | PRN
  Administered 2017-01-22 – 2017-01-24 (×2): 1 mg via ORAL
  Filled 2017-01-22 (×3): qty 1

## 2017-01-22 MED ORDER — PROPOFOL 10 MG/ML IV BOLUS
INTRAVENOUS | Status: AC
  Filled 2017-01-22: qty 40

## 2017-01-22 MED ORDER — ALBUTEROL SULFATE HFA 108 (90 BASE) MCG/ACT IN AERS: 2.0000 | INHALATION_SPRAY | RESPIRATORY_TRACT | Status: DC | PRN

## 2017-01-22 MED ORDER — ACETAMINOPHEN 325 MG PO TABS: 650.0000 mg | ORAL_TABLET | Freq: Four times a day (QID) | ORAL | Status: DC | PRN

## 2017-01-22 MED ORDER — HALOPERIDOL LACTATE 5 MG/ML IJ SOLN
2.0000 mg | Freq: Once | INTRAMUSCULAR | Status: AC
  Administered 2017-01-22: 2 mg via INTRAVENOUS
  Filled 2017-01-22: qty 1

## 2017-01-22 MED ORDER — MORPHINE SULFATE (PF) 4 MG/ML IV SOLN
1.0000 mg | Freq: Once | INTRAVENOUS | Status: AC
  Administered 2017-01-22: 1 mg via INTRAVENOUS
  Filled 2017-01-22: qty 1

## 2017-01-22 MED ORDER — ACETAMINOPHEN 650 MG RE SUPP: 650.0000 mg | Freq: Four times a day (QID) | RECTAL | Status: DC | PRN

## 2017-01-22 MED ORDER — FUROSEMIDE 10 MG/ML IJ SOLN
20.0000 mg | Freq: Once | INTRAMUSCULAR | Status: AC
  Administered 2017-01-22: 20 mg via INTRAVENOUS
  Filled 2017-01-22: qty 2

## 2017-01-22 MED ORDER — ADULT MULTIVITAMIN W/MINERALS CH
1.0000 | ORAL_TABLET | Freq: Every day | ORAL | Status: DC
  Administered 2017-01-23 – 2017-01-24 (×2): 1 via ORAL
  Filled 2017-01-22 (×2): qty 1

## 2017-01-22 MED ORDER — SODIUM CHLORIDE 0.9 % IV BOLUS (SEPSIS)
500.0000 mL | Freq: Once | INTRAVENOUS | Status: AC
  Administered 2017-01-22: 500 mL via INTRAVENOUS

## 2017-01-22 MED ORDER — ALBUTEROL SULFATE (2.5 MG/3ML) 0.083% IN NEBU: 2.5000 mg | INHALATION_SOLUTION | RESPIRATORY_TRACT | Status: DC | PRN

## 2017-01-22 MED ORDER — ONDANSETRON HCL 4 MG PO TABS
4.0000 mg | ORAL_TABLET | Freq: Four times a day (QID) | ORAL | Status: DC | PRN
  Administered 2017-01-22: 4 mg via ORAL
  Filled 2017-01-22: qty 1

## 2017-01-22 MED ORDER — LACTATED RINGERS IV SOLN
INTRAVENOUS | Status: DC
  Administered 2017-01-22: 11:00:00 via INTRAVENOUS

## 2017-01-22 MED ORDER — PROPOFOL 10 MG/ML IV BOLUS
INTRAVENOUS | Status: DC | PRN
  Administered 2017-01-22 (×6): 20 mg via INTRAVENOUS

## 2017-01-22 MED ORDER — LIDOCAINE 2% (20 MG/ML) 5 ML SYRINGE
INTRAMUSCULAR | Status: AC
  Filled 2017-01-22: qty 5

## 2017-01-22 MED ORDER — PROPOFOL 10 MG/ML IV BOLUS
INTRAVENOUS | Status: AC
  Filled 2017-01-22: qty 20

## 2017-01-22 MED ORDER — PROPOFOL 500 MG/50ML IV EMUL
INTRAVENOUS | Status: DC | PRN
  Administered 2017-01-22: 140 ug/kg/min via INTRAVENOUS

## 2017-01-22 MED ORDER — FOLIC ACID 1 MG PO TABS
1.0000 mg | ORAL_TABLET | Freq: Every day | ORAL | Status: DC
  Administered 2017-01-23 – 2017-01-24 (×2): 1 mg via ORAL
  Filled 2017-01-22 (×2): qty 1

## 2017-01-22 MED ORDER — ORAL CARE MOUTH RINSE
15.0000 mL | Freq: Two times a day (BID) | OROMUCOSAL | Status: DC
  Administered 2017-01-22 – 2017-01-24 (×3): 15 mL via OROMUCOSAL

## 2017-01-22 MED ORDER — VITAMIN B-1 100 MG PO TABS
100.0000 mg | ORAL_TABLET | Freq: Every day | ORAL | Status: DC
  Administered 2017-01-23 – 2017-01-24 (×2): 100 mg via ORAL
  Filled 2017-01-22 (×2): qty 1

## 2017-01-22 MED ORDER — PROCHLORPERAZINE EDISYLATE 5 MG/ML IJ SOLN: 10.0000 mg | Freq: Four times a day (QID) | INTRAMUSCULAR | Status: DC | PRN

## 2017-01-22 SURGICAL SUPPLY — 14 items

## 2017-01-22 NOTE — Transfer of Care (Signed)
Immediate Anesthesia Transfer of Care Note  Patient: Michael [name]  Procedure(s) Performed: Procedure(s): ESOPHAGOGASTRODUODENOSCOPY (EGD) WITH PROPOFOL (N/A)  Patient Location: Endoscopy Unit  Anesthesia Type:MAC  Level of Consciousness: awake  Airway & Oxygen Therapy: Patient Spontanous Breathing and Patient connected to nasal cannula oxygen  Post-op Assessment: Report given to RN and Post -op Vital signs reviewed and stable  Post vital signs: Reviewed and stable  Last Vitals:  Vitals:   01/22/17 0930 01/22/17 1006  BP: (!) 98/54 (!) 103/50  Pulse: (!) 108 (!) 112  Resp: 17 20  Temp:  36.7 C    Last Pain:  Vitals:   01/22/17 1006  TempSrc: Oral         Complications: No apparent anesthesia complications

## 2017-01-22 NOTE — Anesthesia Postprocedure Evaluation (Signed)
Anesthesia Post Note  Patient: Michael [name]  Procedure(s) Performed: Procedure(s) (LRB): ESOPHAGOGASTRODUODENOSCOPY (EGD) WITH PROPOFOL (N/A)  Patient location during evaluation: PACU Anesthesia Type: MAC Level of consciousness: awake and alert Pain management: pain level controlled Vital Signs Assessment: post-procedure vital signs reviewed and stable Respiratory status: spontaneous breathing Cardiovascular status: stable Anesthetic complications: no       Last Vitals:  Vitals:   01/22/17 1130 01/22/17 1149  BP:  (!) 104/55  Pulse: (!) 104 (!) 101  Resp: 17 17  Temp:  36.8 C    Last Pain:  Vitals:   01/22/17 1149  TempSrc: Oral                 Nolon Nations

## 2017-01-22 NOTE — Progress Notes (Signed)
Patient seen and examined. Admitted after midnight secondary to hematemesis and melanotic stools. Hx of alcohol abuse and recent increased usage of NSAID's. Currently hemodynamically stable overall (even BP soft) and with Hgb of 10.0. started on IV PPI and octreotide. GI consultation as he will need EGD. Will provide supportive care and follow recommendations. For further details, please refer to H&P written by Dr. Katrinka BlazingSmith on 01/22/17.  Plan: -close monitoring of BP -continue IV PPI and octreotide  -GI has been consulted (Dr. Bosie ClosSchooler) -follow Hgb trend and transfuse as needed  -follow CIWA score and provide Benzodiazepines as needed for withdrawal symptoms   Vassie LollMadera, Ramez Arrona [phone]

## 2017-01-22 NOTE — Anesthesia Preprocedure Evaluation (Addendum)
Anesthesia Evaluation  Patient identified by MRN, date of birth, ID band Patient awake    Reviewed: Allergy & Precautions, NPO status , Patient's Chart, lab work & pertinent test results  Airway Mallampati: II  TM Distance: >3 FB Neck ROM: Full    Dental no notable dental hx.    Pulmonary asthma , sleep apnea , Current Smoker,    Pulmonary exam normal breath sounds clear to auscultation       Cardiovascular hypertension, negative cardio ROS Normal cardiovascular exam Rhythm:Regular Rate:Normal     Neuro/Psych PSYCHIATRIC DISORDERS Anxiety Depression negative neurological ROS     GI/Hepatic Neg liver ROS, GERD  ,  Endo/Other  negative endocrine ROS  Renal/GU negative Renal ROS     Musculoskeletal negative musculoskeletal ROS (+) Arthritis ,   Abdominal   Peds  Hematology  (+) anemia ,   Anesthesia Other Findings   Reproductive/Obstetrics                            Anesthesia Physical Anesthesia Plan  ASA: II  Anesthesia Plan: MAC   Post-op Pain Management:    Induction:   Airway Management Planned:   Additional Equipment:   Intra-op Plan:   Post-operative Plan:   Informed Consent: I have reviewed the patients History and Physical, chart, labs and discussed the procedure including the risks, benefits and alternatives for the proposed anesthesia with the patient or authorized representative who has indicated his/her understanding and acceptance.   Dental advisory given  Plan Discussed with: CRNA  Anesthesia Plan Comments:         Anesthesia Quick Evaluation

## 2017-01-22 NOTE — Progress Notes (Signed)
Initial Nutrition Assessment  DOCUMENTATION CODES:   Not applicable  INTERVENTION:  - Diet advancement as medically feasible. - RD will monitor for nutrition-related needs with diet advancement.  NUTRITION DIAGNOSIS:   Inadequate oral intake related to inability to eat as evidenced by NPO status.  GOAL:   Patient will meet greater than or equal to 90% of their needs  MONITOR:   Diet advancement, Weight trends, Labs, I & O's  REASON FOR ASSESSMENT:   Malnutrition Screening Tool  ASSESSMENT:   51 y.o. male with medical history significant of HTN, alcohol abuse, recurrent skin abscesses; who presents with complaints of vomiting blood since this afternoon. Patient reports having multiple episodes to the point which he cannot quantify. Associated symptoms include black stools. Patient denies having similar symptoms like this ever in the past. Patient reports that for the last 6 months he had been taking 800 mg of ibuprofen twice daily and just recently stopped approximately 2-3 weeks ago. Associated symptoms include hiccups and epigastric discomfort Patient was seen by dermatology for a rash on his back and it was recommended that the ibuprofen could possibly be causing symptoms. Biopsies were taken and he was started on a cream with improvement of the rash. Patient also reports drinking approximately one bottle of wine on a daily basis. Denies any previous history of withdrawals.  Pt seen for MST. BMI indicates overweight status. Pt has been NPO since admission. He recently returned to his room from EGD and no family/visitors are present at bedside. Per notes, pt drinks a bottle of wine per day. Unsure of PO intakes of foods and other beverages PTA.  Visualization of pt does not indicate any muscle or fat wasting but will need to complete full physical assessment at follow-up to confirm. Per chart review, pt has had many weight fluctuations over the past 3 months and will need to discuss  this more with him to determine possible causality for this. Most recently he lost 18 lbs (9% body weight) in 2 weeks after gaining 6 lbs in the prior 2 weeks. Noted large volume of IVF ordered which may indicate dehydration on admission.   Medications reviewed; 1 mg oral folic acid/day, 1 daily multivitamin with minerals, 8 mg/hr IV Protonix, 100 mg IV thiamine/day.  Labs reviewed; BUN: 26 mg/dL, Ca: 8.4 mg/dL.    Diet Order:  Diet NPO time specified  Skin:  Reviewed, no issues  Last BM:  2/27  Height:   Ht Readings from Last 1 Encounters:  01/21/17 5\' 11"  (1.803 m)    Weight:   Wt Readings from Last 1 Encounters:  01/21/17 182 lb (82.6 kg)    Ideal Body Weight:  78.18 kg  BMI:  Body mass index is 25.38 kg/m.  Estimated Nutritional Needs:   Kcal:  1900-2150 (23-26 kcal/kg)  Protein:  75-85 grams   Fluid:  1.9-2.1 L/day  EDUCATION NEEDS:   No education needs identified at this time    Trenton GammonJessica Adilynne Fitzwater, MS, RD, LDN, CNSC Inpatient Clinical Dietitian Pager # [phone] After hours/weekend pager # [phone]

## 2017-01-22 NOTE — Progress Notes (Signed)
Pt back from endo, resting in bed comfortably.

## 2017-01-22 NOTE — Op Note (Addendum)
[facility] Patient Name: Michael [name] Procedure Date: 01/22/2017 MRN: [id number] Attending MD: Shirley Friar , MD Date of Birth: 09-01-1966 CSN: [id number] Age: 51 Admit Type: Inpatient Procedure:                Upper GI endoscopy Indications:              Hematemesis, Melena Providers:                Shirley Friar, MD, Dwain Sarna, RN, Harrington Challenger, Technician Referring MD:              Medicines:                Propofol per Anesthesia, Monitored Anesthesia Care Complications:            No immediate complications. Estimated Blood Loss:     Estimated blood loss: none. Procedure:                Pre-Anesthesia Assessment:                           - Prior to the procedure, a History and Physical                            was performed, and patient medications and                            allergies were reviewed. The patient's tolerance of                            previous anesthesia was also reviewed. The risks                            and benefits of the procedure and the sedation                            options and risks were discussed with the patient.                            All questions were answered, and informed consent                            was obtained. Prior Anticoagulants: The patient has                            taken no previous anticoagulant or antiplatelet                            agents. ASA Grade Assessment: III - A patient with                            severe systemic disease. After reviewing the risks  and benefits, the patient was deemed in                            satisfactory condition to undergo the procedure.                           After obtaining informed consent, the endoscope was                            passed under direct vision. Throughout the                            procedure, the patient's blood pressure, pulse, and        oxygen saturations were monitored continuously. The                            [id number] [id number]) scope was introduced through the                            mouth, and advanced to the second part of duodenum.                            The upper GI endoscopy was accomplished without                            difficulty. The patient tolerated the procedure                            well. Scope In: Scope Out: Findings:      A small non-bleeding Mallory-Weiss tear with stigmata of recent bleeding       was found.      Grade I varices were found in the lower third of the esophagus.      Mild portal hypertensive gastropathy was found in the gastric fundus and       in the gastric body.      Segmental mild inflammation characterized by congestion (edema) and       erythema was found in the gastric antrum.      The examined duodenum was normal. Impression:               - Mallory-Weiss tear.                           - Grade I esophageal varices.                           - Portal hypertensive gastropathy.                           - Acute gastritis.                           - Normal examined duodenum.                           - No specimens collected. Moderate Sedation:  N/A- Per Anesthesia Care Recommendation:           - Give Protonix (pantoprazole): 8 mg/hr IV by                            continuous infusion.                           - Post procedure medication orders were given.                           - NPO until mental status improves and then start                            clear liquid diet. Procedure Code(s):        --- Professional ---                           [phone], Esophagogastroduodenoscopy, flexible,                            transoral; diagnostic, including collection of                            specimen(s) by brushing or washing, when performed                            (separate procedure) Diagnosis Code(s):        --- Professional ---                            K92.1, Melena (includes Hematochezia)                           K92.0, Hematemesis                           K22.6, Gastro-esophageal laceration-hemorrhage                            syndrome                           I85.00, Esophageal varices without bleeding                           K76.6, Portal hypertension                           K29.00, Acute gastritis without bleeding                           K31.89, Other diseases of stomach and duodenum CPT copyright 2016 [employer]. All rights reserved. The codes documented in this report are preliminary and upon coder review may  be revised to meet current compliance requirements. Shirley Friar, MD 01/22/2017 11:09:51 AM This report has been signed electronically. Number of Addenda: 0

## 2017-01-22 NOTE — Interval H&P Note (Signed)
History and Physical Interval Note:  01/22/2017 10:35 AM  Michael [name]  has presented today for surgery, with the diagnosis of melena  The various methods of treatment have been discussed with the patient and family. After consideration of risks, benefits and other options for treatment, the patient has consented to  Procedure(s): ESOPHAGOGASTRODUODENOSCOPY (EGD) WITH PROPOFOL (N/A) as a surgical intervention .  The patient's history has been reviewed, patient examined, no change in status, stable for surgery.  I have reviewed the patient's chart and labs.  Questions were answered to the patient's satisfaction.     [name]
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
## 2017-01-23 ENCOUNTER — Encounter (HOSPITAL_COMMUNITY): Payer: Self-pay | Admitting: Gastroenterology

## 2017-01-23 DIAGNOSIS — K922 Gastrointestinal hemorrhage, unspecified: Secondary | ICD-10-CM

## 2017-01-23 DIAGNOSIS — D62 Acute posthemorrhagic anemia: Secondary | ICD-10-CM

## 2017-01-23 DIAGNOSIS — K920 Hematemesis: Secondary | ICD-10-CM

## 2017-01-23 DIAGNOSIS — F1010 Alcohol abuse, uncomplicated: Secondary | ICD-10-CM

## 2017-01-23 LAB — BPAM RBC
Blood Product Expiration Date: 201803272359
ISSUE DATE / TIME: 201802271456
UNIT TYPE AND RH: 5100

## 2017-01-23 LAB — HIV ANTIBODY (ROUTINE TESTING W REFLEX): HIV SCREEN 4TH GENERATION: NONREACTIVE

## 2017-01-23 LAB — TYPE AND SCREEN
ABO/RH(D): O POS
ANTIBODY SCREEN: NEGATIVE
UNIT DIVISION: 0

## 2017-01-23 MED ORDER — LORAZEPAM 2 MG/ML IJ SOLN
2.0000 mg | Freq: Once | INTRAMUSCULAR | Status: AC
  Administered 2017-01-23: 2 mg via INTRAVENOUS

## 2017-01-23 MED ORDER — NADOLOL 20 MG PO TABS
20.0000 mg | ORAL_TABLET | Freq: Every day | ORAL | Status: DC
  Administered 2017-01-23 – 2017-01-24 (×2): 20 mg via ORAL
  Filled 2017-01-23 (×2): qty 1

## 2017-01-23 MED ORDER — DIPHENHYDRAMINE HCL 50 MG PO CAPS
50.0000 mg | ORAL_CAPSULE | Freq: Every day | ORAL | Status: DC
  Administered 2017-01-23: 50 mg via ORAL
  Filled 2017-01-23: qty 1

## 2017-01-23 MED ORDER — PANTOPRAZOLE SODIUM 40 MG PO TBEC
40.0000 mg | DELAYED_RELEASE_TABLET | Freq: Two times a day (BID) | ORAL | Status: DC
  Administered 2017-01-23 – 2017-01-24 (×3): 40 mg via ORAL
  Filled 2017-01-23 (×3): qty 1

## 2017-01-23 MED ORDER — ZOLPIDEM TARTRATE 10 MG PO TABS
10.0000 mg | ORAL_TABLET | Freq: Every day | ORAL | Status: DC
  Administered 2017-01-23: 10 mg via ORAL
  Filled 2017-01-23: qty 1

## 2017-01-23 NOTE — Clinical Social Work Note (Addendum)
Clinical Social Work Assessment  Patient Details  Name: Michael [name] MRN: [id number] Date of Birth: 1966/09/14  Date of referral:  01/23/17               Reason for consult:  Substance Use/ETOH Abuse, Facility Placement                Permission sought to share information with:  Family Supports, Chartered certified accountant granted to share information::  Yes, Verbal Permission Granted  Name::        Agency::     Relationship::     Contact Information:     Housing/Transportation Living arrangements for the past 2 months:  Single Family Home Source of Information:  Patient, Parent (Significant other) Patient Interpreter Needed:  None Criminal Activity/Legal Involvement Pertinent to Current Situation/Hospitalization:    Significant Relationships:  Significant Other, Parents Lives with:  Significant Other Do you feel safe going back to the place where you live?  Yes Need for family participation in patient care:  No (Coment)  Care giving concerns:  None listed by pt/family    Social Worker assessment / plan:  CSW met with pt and pt's family and significant other and confirmed pt's plan to return home to live at discharge. CSW provided pt with outpatient psychiatric and intensive outpatient substance abuse treatment resources.  Pt has been living independently with his significant other prior to being admitted.  CSW also provided pt with 12-Step A.A. Meeting schedule.    Employment status:  [employer] information:  Managed Care PT Recommendations:  Not assessed at this time Information / Referral to community resources:  Outpatient Psychiatric Care (Comment Required)  Patient/Family's Response to care:  Patient alert and oriented.  Patient and pt's family and significant other agreeable to plan.  Pt's family and significant other supportive and strongly involved in pt.'s care.  Pt. pleasant and appreciated CSW intervention.     Patient/Family's  Understanding of and Emotional Response to Diagnosis, Current Treatment, and Prognosis:  Still assessing  Emotional Assessment Appearance:  Appears older than stated age Attitude/Demeanor/Rapport:    Affect (typically observed):  Accepting, Adaptable, Stoic, Calm Orientation:  Oriented to Self, Oriented to Place, Oriented to  Time, Oriented to Situation Alcohol / Substance use:    Psych involvement (Current and /or in the community):     Discharge Needs  Concerns to be addressed:  Substance Abuse Concerns Readmission within the last 30 days:    Current discharge risk:  Substance Abuse Barriers to Discharge:  No Barriers Identified   Claudine Mouton, LCSWA 01/23/2017, 11:13 PM

## 2017-01-23 NOTE — Progress Notes (Signed)
[facility] Progress Note  Michael [name] 51 y.o. 01/16/1966   Subjective: Sleeping but more arousable today. Denies abdominal pain. Mother at bedside.  Objective: Vital signs in last 24 hours: Vitals:   01/23/17 0700 01/23/17 1000  BP:  (!) 102/49  Pulse: 80 79  Resp: 17 13  Temp: 97.9 F (36.6 C)     Physical Exam: Gen: lethargic, no acute distress HEENT: anicteric sclera CV: RRR Chest: CTA B Abd: soft, nontender, nondistended, +BS Ext: no edema  Lab Results:  Recent Labs  01/21/17 2252 01/22/17 0321  NA 135 137  K 4.5 5.0  CL 104 108  CO2 20* 21*  GLUCOSE 96 98  BUN 26* 26*  CREATININE 0.99 0.90  CALCIUM 9.3 8.4*    Recent Labs  01/21/17 2252  AST 49*  ALT 21  ALKPHOS 63  BILITOT 1.2  PROT 7.5  ALBUMIN 3.3*    Recent Labs  01/22/17 1150 01/22/17 2106  WBC 6.6 6.2  HGB 8.0* 9.0*  HCT 23.4* 26.4*  MCV 91.1 92.3  PLT 119* 127*    Recent Labs  01/21/17 2252  LABPROT 19.8*  INR 1.66      Assessment/Plan: 51 yo with alcoholic cirrhosis and s/p upper GI bleed due to a Mallory Weiss tear. Needs to stop drinking alcohol but has not expressed any desire to do so. Mother inquires about community programs that could help him and will defer to primary team and social services when he is close to discharge. He expresses no desire at this time to stop drinking alcohol. He will not be considered as a liver transplant candidate (at most transplant centers) until he stops drinking alcohol for at least 6 months. Low salt diet. No NSAIDs. Advance diet slowly. Continue PPI PO BID. Will sign off. Call if questions.   Michael [name] C. 01/23/2017, 11:29 AM   Pager [phone]  If no answer or after 5 PM call [phone] ID: Michael [name], male   DOB: 05/01/1966, 51 y.o.   MRN: [id number]

## 2017-01-23 NOTE — Progress Notes (Signed)
[facility] PROGRESS NOTE    Progress Note  Michael [name]  WUJ:[id number] DOB: [birth year] DOA: 01/21/2017 PCP: Pcp Not In System     Brief Narrative:   Michael [name] is an 51 y.o. male past medical history of alcohol abuse and NSAIDs came into the hospital with profuse vomiting red blood, status post EGD that showed Mallory-Weiss tear grade 1 esophageal varices and acute gastritis status post EGD on 01/22/2017  Assessment/Plan:   Acute blood loss anemia/ Upper GI bleed Continue IV PPI and octreotide, status post EGD on 01/22/2017 T above for further details. Baseline hemoglobin at around 12, today is 9.0, improved from 2.27.2018 D/c octreotide start nadalol, change Protonix to oral.  Alcohol abuse: Unknown when was his last alcoholic beverage. Continue Ativan protocol, CIWA score is high Resume ambien. Mental status improved.  Hidradenitis  GERD (gastroesophageal reflux disease)   DVT prophylaxis: SCD Family Communication:none Disposition Plan/Barrier to D/C: transfer to telemetry Code Status:     Code Status Orders        Start     Ordered   01/22/17 0107  Full code  Continuous     01/22/17 0110    Code Status History    Date Active Date Inactive Code Status Order ID Comments User Context   This patient has a current code status but no historical code status.    Advance Directive Documentation   Flowsheet Row Most Recent Value  Type of Advance Directive  Living will  Pre-existing out of facility DNR order (yellow form or pink MOST form)  No data  "MOST" Form in Place?  No data        IV Access:    Peripheral IV   Procedures and diagnostic studies:   Dg Chest Portable 1 View  Result Date: 01/21/2017 CLINICAL DATA:  GI bleed EXAM: PORTABLE CHEST 1 VIEW COMPARISON:  Chest radiograph 12/05/2016 FINDINGS: The heart size and mediastinal contours are within normal limits. Both lungs are clear. The visualized skeletal structures are unremarkable.  IMPRESSION: No active disease. Electronically Signed   By: Deatra Robinson M.D.   On: 01/21/2017 22:49     Medical Consultants:    None.  Anti-Infectives:   None  Subjective:    Michael [name] he relates he feel better, not abdominal pain  Objective:    Vitals:   01/23/17 0200 01/23/17 0321 01/23/17 0400 01/23/17 0623  BP: 117/82  117/69 (!) 143/82  Pulse: 89  83 97  Resp: 18  (!) 9 16  Temp:  98 F (36.7 C)    TempSrc:  Oral    SpO2: 99%  99% 99%  Weight:      Height:        Intake/Output Summary (Last 24 hours) at 01/23/17 0722 Last data filed at 01/23/17 0501  Gross per 24 hour  Intake          4753.33 ml  Output             3600 ml  Net          1153.33 ml   Filed Weights   01/21/17 2216  Weight: 82.6 kg (182 lb)    Exam: General exam: In no acute distress. Respiratory system: Good air movement and clear to auscultation. Cardiovascular system: S1 & S2 heard, RRR.  Gastrointestinal system: Abdomen is nondistended, soft and nontender.  Central nervous system: Alert and oriented. No focal neurological deficits. Extremities: No pedal edema. Skin: No rashes, lesions or ulcers Psychiatry: Judgement and  insight appear normal. Mood & affect appropriate.    Data Reviewed:    Labs: Basic Metabolic Panel:  Recent Labs Lab 01/21/17 2252 01/22/17 0321  NA 135 137  K 4.5 5.0  CL 104 108  CO2 20* 21*  GLUCOSE 96 98  BUN 26* 26*  CREATININE 0.99 0.90  CALCIUM 9.3 8.4*   GFR Estimated Creatinine Clearance: 104.6 mL/min (by C-G formula based on SCr of 0.9 mg/dL). Liver Function Tests:  Recent Labs Lab 01/21/17 2252  AST 49*  ALT 21  ALKPHOS 63  BILITOT 1.2  PROT 7.5  ALBUMIN 3.3*   No results for input(s): LIPASE, AMYLASE in the last 168 hours. No results for input(s): AMMONIA in the last 168 hours. Coagulation profile  Recent Labs Lab 01/21/17 2252  INR 1.66    CBC:  Recent Labs Lab 01/21/17 2252 01/22/17 0340 01/22/17 1150  01/22/17 2106  WBC 16.3*  --  6.6 6.2  HGB 11.0* 10.0* 8.0* 9.0*  HCT 32.1* 29.0* 23.4* 26.4*  MCV 91.5  --  91.1 92.3  PLT 259  --  119* 127*   Cardiac Enzymes: No results for input(s): CKTOTAL, CKMB, CKMBINDEX, TROPONINI in the last 168 hours. BNP (last 3 results) No results for input(s): PROBNP in the last 8760 hours. CBG: No results for input(s): GLUCAP in the last 168 hours. D-Dimer: No results for input(s): DDIMER in the last 72 hours. Hgb A1c: No results for input(s): HGBA1C in the last 72 hours. Lipid Profile: No results for input(s): CHOL, HDL, LDLCALC, TRIG, CHOLHDL, LDLDIRECT in the last 72 hours. Thyroid function studies: No results for input(s): TSH, T4TOTAL, T3FREE, THYROIDAB in the last 72 hours.  Invalid input(s): FREET3 Anemia work up: No results for input(s): VITAMINB12, FOLATE, FERRITIN, TIBC, IRON, RETICCTPCT in the last 72 hours. Sepsis Labs:  Recent Labs Lab 01/21/17 2252 01/22/17 1150 01/22/17 2106  WBC 16.3* 6.6 6.2   Microbiology Recent Results (from the past 240 hour(s))  MRSA PCR Screening     Status: None   Collection Time: 01/22/17  3:35 AM  Result Value Ref Range Status   MRSA by PCR NEGATIVE NEGATIVE Final    Comment:        The GeneXpert MRSA Assay (FDA approved for NASAL specimens only), is one component of a comprehensive MRSA colonization surveillance program. It is not intended to diagnose MRSA infection nor to guide or monitor treatment for MRSA infections.      Medications:   . sodium chloride   Intravenous Once  . folic acid  1 mg Oral Daily  . LORazepam  0-4 mg Intravenous Q6H   Followed by  . [START ON 01/24/2017] LORazepam  0-4 mg Intravenous Q12H  . mouth rinse  15 mL Mouth Rinse BID  . multivitamin with minerals  1 tablet Oral Daily  . thiamine  100 mg Oral Daily   Or  . thiamine  100 mg Intravenous Daily  . triamcinolone ointment  1 application Topical BID   Continuous Infusions: . sodium chloride 125  mL/hr at 01/23/17 0200  . octreotide  (SANDOSTATIN)    IV infusion 50 mcg/hr (01/23/17 0200)  . pantoprozole (PROTONIX) infusion 8 mg/hr (01/23/17 0200)    Time spent: 25 min   LOS: 1 day   Marinda ElkFELIZ ORTIZ, ABRAHAM  [facility] Pager [phone]  *Please refer to [email or web address], password [email or web address] to get updated schedule on who will round on this patient, as hospitalists switch teams weekly. If 7PM-7AM, please contact night-coverage at [email or web address],  password [email or web address] for any overnight needs.  01/23/2017, 7:22 AM

## 2017-01-23 NOTE — Progress Notes (Signed)
[facility] RN for [phone].

## 2017-01-24 MED ORDER — ENSURE ENLIVE PO LIQD: 237.0000 mL | Freq: Two times a day (BID) | ORAL | Status: DC

## 2017-01-24 MED ORDER — ADULT MULTIVITAMIN W/MINERALS CH: 1.0000 | ORAL_TABLET | Freq: Every day | ORAL | Status: DC

## 2017-01-24 MED ORDER — OMEPRAZOLE 20 MG PO CPDR: 20.0000 mg | DELAYED_RELEASE_CAPSULE | Freq: Two times a day (BID) | ORAL | 3 refills | Status: DC

## 2017-01-24 NOTE — Discharge Summary (Addendum)
Physician Discharge Summary  Neena RhymesDonald Busbin VHQ:[id number] DOB: 07/25/1966 DOA: 01/21/2017  PCP: Pcp Not In System  Admit date: 01/21/2017 Discharge date: 01/24/2017  Admitted From: home Disposition:  Home  Recommendations for Outpatient Follow-up:  1. Follow up with PCP in 1-2 weeks 2. Please obtain BMP/CBC in one week   Home Health:no Equipment/Devices:None  Discharge Condition:stable CODE STATUS:full Diet recommendation: Heart Healthy  Brief/Interim Summary: 51 y.o. male past medical history of alcohol abuse and NSAIDs came into the hospital with profuse vomiting red blood,  Discharge Diagnoses:  Principal Problem:   Upper GI bleed Active Problems:   Hidradenitis   GERD (gastroesophageal reflux disease)   Acute blood loss anemia   Hematemesis   Alcohol abuse  Acute blood loss anemia/ Upper GI bleed'; Started on IV PPI and octreotide, status post EGD on 01/22/2017. Baseline hemoglobin at around 12, today is 9.0, improved from 2.27.2018 Will cont PPI at home.  Alcohol abuse: Unknown when was his last alcoholic beverage. He was started Alcohol withrawal protocol and he improved. He was counseled about abstinence and social worker was consulted for outpatient rehabilitation counseling. I had a long  discussion about abstinence and he would like to AAA meetings.  Hidradenitis  GERD (gastroesophageal reflux disease)   Discharge Instructions  Discharge Instructions    Diet - low sodium heart healthy    Complete by:  As directed    Increase activity slowly    Complete by:  As directed      Allergies as of 01/24/2017      Reactions   Penicillins Rash   Has patient had a PCN reaction causing immediate rash, facial/tongue/throat swelling, SOB or lightheadedness with hypotension: yes Has patient had a PCN reaction causing severe rash involving mucus membranes or skin necrosis: no Has patient had a PCN reaction that required hospitalization: no Has patient had a PCN  reaction occurring within the last 10 years: no If all of the above answers are "NO", then may proceed with Cephalosporin use.   Latex       Medication List    STOP taking these medications   amphetamine-dextroamphetamine 20 MG tablet Commonly known as:  ADDERALL   doxycycline 100 MG tablet Commonly known as:  VIBRA-TABS     TAKE these medications   ALPRAZolam 1 MG tablet Commonly known as:  XANAX Take 1 mg by mouth at bedtime as needed for anxiety or sleep.   diphenhydrAMINE 50 MG capsule Commonly known as:  BENADRYL Take 50 mg by mouth at bedtime.   HUMIRA 40 MG/0.8ML Pskt Generic drug:  Adalimumab Inject 40 mg into the skin every 7 (seven) days.   lisinopril-hydrochlorothiazide 20-12.5 MG tablet Commonly known as:  PRINZIDE,ZESTORETIC Take 1 tablet by mouth 2 (two) times daily.   multivitamin with minerals Tabs tablet Take 1 tablet by mouth daily.   omeprazole 20 MG capsule Commonly known as:  PRILOSEC Take 1 capsule (20 mg total) by mouth 2 (two) times daily.   predniSONE 20 MG tablet Commonly known as:  DELTASONE Take 2 tablets (40 mg total) by mouth daily with breakfast.   PROAIR HFA 108 (90 Base) MCG/ACT inhaler Generic drug:  albuterol Inhale 2 puffs into the lungs every 4 (four) hours as needed for wheezing or shortness of breath.   tadalafil 20 MG tablet Commonly known as:  CIALIS Take 20 mg by mouth daily as needed for erectile dysfunction.   testosterone cypionate 100 MG/ML injection Commonly known as:  DEPOTESTOTERONE CYPIONATE Inject 100 mg  into the muscle once a week. For IM use only   traZODone 100 MG tablet Commonly known as:  DESYREL Take 100-200 mg by mouth at bedtime as needed for sleep.   triamcinolone ointment 0.1 % Commonly known as:  KENALOG Apply 1 application topically 2 (two) times daily.   zolpidem 10 MG tablet Commonly known as:  AMBIEN Take 10 mg by mouth at bedtime.       Allergies  Allergen Reactions  .  Penicillins Rash    Has patient had a PCN reaction causing immediate rash, facial/tongue/throat swelling, SOB or lightheadedness with hypotension: yes Has patient had a PCN reaction causing severe rash involving mucus membranes or skin necrosis: no Has patient had a PCN reaction that required hospitalization: no Has patient had a PCN reaction occurring within the last 10 years: no If all of the above answers are "NO", then may proceed with Cephalosporin use.   . Latex     Consultations:  GI   Procedures/Studies: Dg Chest Portable 1 View  Result Date: 01/21/2017 CLINICAL DATA:  GI bleed EXAM: PORTABLE CHEST 1 VIEW COMPARISON:  Chest radiograph 12/05/2016 FINDINGS: The heart size and mediastinal contours are within normal limits. Both lungs are clear. The visualized skeletal structures are unremarkable. IMPRESSION: No active disease. Electronically Signed   By: Deatra Robinson M.D.   On: 01/21/2017 22:49   Xr Lumbar Spine Complete  Result Date: 01/07/2017 X-rays lumbar spine show multilevel lumbar spondylosis. Most noted at L5-S1 L1-2. A couple millimeters of L4 retrolisthesis. Facet arthropathy. No acute changes.   Subjective: He has no new complains  Discharge Exam: Vitals:   01/23/17 2013 01/24/17 0516  BP: 120/74 112/66  Pulse: 81 80  Resp: 18 20  Temp: 98.1 F (36.7 C) 98 F (36.7 C)   Vitals:   01/23/17 1000 01/23/17 1200 01/23/17 2013 01/24/17 0516  BP: (!) 102/49 113/67 120/74 112/66  Pulse: 79 79 81 80  Resp: 13 20 18 20   Temp:  97.4 F (36.3 C) 98.1 F (36.7 C) 98 F (36.7 C)  TempSrc:  Oral Oral Oral  SpO2: 94% 94% 100% 97%  Weight:      Height:        General: Pt is alert, awake, not in acute distress Cardiovascular: RRR, S1/S2 +, no rubs, no gallops Respiratory: CTA bilaterally, no wheezing, no rhonchi Abdominal: Soft, NT, ND, bowel sounds + Extremities: no edema, no cyanosis    The results of significant diagnostics from this hospitalization  (including imaging, microbiology, ancillary and laboratory) are listed below for reference.     Microbiology: Recent Results (from the past 240 hour(s))  MRSA PCR Screening     Status: None   Collection Time: 01/22/17  3:35 AM  Result Value Ref Range Status   MRSA by PCR NEGATIVE NEGATIVE Final    Comment:        The GeneXpert MRSA Assay (FDA approved for NASAL specimens only), is one component of a comprehensive MRSA colonization surveillance program. It is not intended to diagnose MRSA infection nor to guide or monitor treatment for MRSA infections.      Labs: BNP (last 3 results) No results for input(s): BNP in the last 8760 hours. Basic Metabolic Panel:  Recent Labs Lab 01/21/17 2252 01/22/17 0321  NA 135 137  K 4.5 5.0  CL 104 108  CO2 20* 21*  GLUCOSE 96 98  BUN 26* 26*  CREATININE 0.99 0.90  CALCIUM 9.3 8.4*   Liver Function Tests:  Recent Labs Lab 01/21/17 2252  AST 49*  ALT 21  ALKPHOS 63  BILITOT 1.2  PROT 7.5  ALBUMIN 3.3*   No results for input(s): LIPASE, AMYLASE in the last 168 hours. No results for input(s): AMMONIA in the last 168 hours. CBC:  Recent Labs Lab 01/21/17 2252 01/22/17 0340 01/22/17 1150 01/22/17 2106  WBC 16.3*  --  6.6 6.2  HGB 11.0* 10.0* 8.0* 9.0*  HCT 32.1* 29.0* 23.4* 26.4*  MCV 91.5  --  91.1 92.3  PLT 259  --  119* 127*   Cardiac Enzymes: No results for input(s): CKTOTAL, CKMB, CKMBINDEX, TROPONINI in the last 168 hours. BNP: Invalid input(s): POCBNP CBG: No results for input(s): GLUCAP in the last 168 hours. D-Dimer No results for input(s): DDIMER in the last 72 hours. Hgb A1c No results for input(s): HGBA1C in the last 72 hours. Lipid Profile No results for input(s): CHOL, HDL, LDLCALC, TRIG, CHOLHDL, LDLDIRECT in the last 72 hours. Thyroid function studies No results for input(s): TSH, T4TOTAL, T3FREE, THYROIDAB in the last 72 hours.  Invalid input(s): FREET3 Anemia work up No results for  input(s): VITAMINB12, FOLATE, FERRITIN, TIBC, IRON, RETICCTPCT in the last 72 hours. Urinalysis    Component Value Date/Time   BILIRUBINUR neg 05/17/2014 1736   PROTEINUR neg 05/17/2014 1736   UROBILINOGEN 1.0 05/17/2014 1736   NITRITE neg 05/17/2014 1736   LEUKOCYTESUR Negative 05/17/2014 1736   Sepsis Labs Invalid input(s): PROCALCITONIN,  WBC,  LACTICIDVEN Microbiology Recent Results (from the past 240 hour(s))  MRSA PCR Screening     Status: None   Collection Time: 01/22/17  3:35 AM  Result Value Ref Range Status   MRSA by PCR NEGATIVE NEGATIVE Final    Comment:        The GeneXpert MRSA Assay (FDA approved for NASAL specimens only), is one component of a comprehensive MRSA colonization surveillance program. It is not intended to diagnose MRSA infection nor to guide or monitor treatment for MRSA infections.      Time coordinating discharge: Over 30 minutes  SIGNED:   Marinda Elk, MD  [facility] 01/24/2017, 10:45 AM Pager   If 7PM-7AM, please contact night-coverage [email or web address] Password [email or web address]

## 2017-01-24 NOTE — Progress Notes (Signed)
Initial Nutrition Assessment  DOCUMENTATION CODES:   Not applicable  INTERVENTION:   Ensure Enlive po BID, each supplement provides 350 kcal and 20 grams of protein  MVI  NUTRITION DIAGNOSIS:   Inadequate oral intake related to inability to eat as evidenced by NPO status. Resolving, pt currently eating 50% full liquid diet  GOAL:   Patient will meet greater than or equal to 90% of their needs  MONITOR:   Diet advancement, Weight trends, Labs, I & O's    ASSESSMENT:   51 y.o. male past medical history of cirrhosis, alcohol abuse and NSAIDs came into the hospital with profuse vomiting red blood, status post EGD that showed Mallory-Weiss tear grade 1 esophageal varices and acute gastritis status post EGD on 01/22/2017   Met with pt in room today. Pt reports improving appetite and eating 50% full liquid diet. Per pt, he has lost 18lbs(9%) over the past couple of months. This is severe given history. Pt with moderate muscle wasting in legs. RD spoke with pt about the importance of adequate protein intake. Pt to possibly discharge today.   Medications reviewed and include: folic acid, MVI, protonix, thiamine  Labs reviewed: BUN 26(H), Ca 8.4(L) Hgb 9.0(L), Hct 26.4(L)  Nutrition-Focused physical exam completed. Findings are no fat depletion, moderate muscle depletion in lower legs, and no edema.   Diet Order:  Diet - low sodium heart healthy Diet Heart Room service appropriate? Yes; Fluid consistency: Thin  Skin:  Reviewed, no issues  Last BM:  2/28  Height:   Ht Readings from Last 1 Encounters:  01/21/17 5' 11"  (1.803 m)    Weight:   Wt Readings from Last 1 Encounters:  01/24/17 191 lb 5.8 oz (86.8 kg)    Ideal Body Weight:  78.18 kg  BMI:  Body mass index is 26.69 kg/m.  Estimated Nutritional Needs:   Kcal:  1900-2150 (23-26 kcal/kg)  Protein:  75-85 grams   Fluid:  1.9-2.1 L/day  EDUCATION NEEDS:   No education needs identified at this  time  Koleen Distance, RD, [place] Pager #- [phone]

## 2017-01-24 NOTE — Progress Notes (Signed)
Went over d/c instructions with patient and his partner.  He verbalized understanding.  Left hospital via w/c and personal vehicle.

## 2017-02-06 ENCOUNTER — Other Ambulatory Visit (INDEPENDENT_AMBULATORY_CARE_PROVIDER_SITE_OTHER): Payer: Self-pay | Admitting: Surgery

## 2017-02-06 ENCOUNTER — Telehealth (INDEPENDENT_AMBULATORY_CARE_PROVIDER_SITE_OTHER): Payer: Self-pay | Admitting: Specialist

## 2017-02-06 DIAGNOSIS — Z77018 Contact with and (suspected) exposure to other hazardous metals: Secondary | ICD-10-CM

## 2017-02-06 NOTE — Telephone Encounter (Signed)
[facility] asking for a new order to be sent over for the patient. CB # [phone] ex [phone]

## 2017-02-06 NOTE — Telephone Encounter (Signed)
Lmom for Michael [name] to call me back to let me know what she is needing an order for---

## 2017-02-14 ENCOUNTER — Telehealth (INDEPENDENT_AMBULATORY_CARE_PROVIDER_SITE_OTHER): Payer: Self-pay | Admitting: Specialist

## 2017-02-14 NOTE — Telephone Encounter (Signed)
Patient called wanting to speak with you about his MRI.  He said things have been all messed up with it.  CB#[phone].  Thank you

## 2017-02-15 ENCOUNTER — Encounter (INDEPENDENT_AMBULATORY_CARE_PROVIDER_SITE_OTHER): Payer: Self-pay

## 2017-02-15 ENCOUNTER — Encounter (INDEPENDENT_AMBULATORY_CARE_PROVIDER_SITE_OTHER): Payer: Self-pay | Admitting: Specialist

## 2017-02-15 ENCOUNTER — Ambulatory Visit (INDEPENDENT_AMBULATORY_CARE_PROVIDER_SITE_OTHER): Payer: BLUE CROSS/BLUE SHIELD | Admitting: Specialist

## 2017-02-15 VITALS — BP 135/81 | HR 81 | Ht 71.0 in | Wt 190.0 lb

## 2017-02-15 DIAGNOSIS — Z5321 Procedure and treatment not carried out due to patient leaving prior to being seen by health care provider: Secondary | ICD-10-CM

## 2017-02-15 NOTE — Telephone Encounter (Signed)
[name] came in today to be seen for his neck and low back pain. But he waited for 3 hours and had to leave to take his mother to her appt.     He states that he is still having numbness in both of his feet and his low back pain is worse.  He states that he noticed the numbness in his feet on 11/10/2016 and he has been tested for diabetes and neuropathy and they are all negative.  He is getting tired of dealing with this.  For his neck, his states that it is stiff and it hurts, he states that there is a spur that is causing a bulge that could be causing the numbness in his feet. He is wanting to see if he can get scans done on both the neck and lumbar both done.  He is scheduled for Lumbar MRI on 02/18/2017 @ 6pm.  He would like a call back from Dr. Otelia SergeantNitka regarding this if possible.
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
## 2017-02-18 ENCOUNTER — Ambulatory Visit
Admission: RE | Admit: 2017-02-18 | Discharge: 2017-02-18 | Disposition: A | Discharge status: Home / Self Care | Payer: BLUE CROSS/BLUE SHIELD | Source: Ambulatory Visit | Attending: Surgery | Admitting: Surgery

## 2017-02-18 ENCOUNTER — Other Ambulatory Visit: Payer: BLUE CROSS/BLUE SHIELD

## 2017-02-18 DIAGNOSIS — M4726 Other spondylosis with radiculopathy, lumbar region: Secondary | ICD-10-CM

## 2017-02-18 DIAGNOSIS — Z77018 Contact with and (suspected) exposure to other hazardous metals: Secondary | ICD-10-CM

## 2017-02-25 ENCOUNTER — Encounter (INDEPENDENT_AMBULATORY_CARE_PROVIDER_SITE_OTHER): Payer: Self-pay | Admitting: Specialist

## 2017-03-07 ENCOUNTER — Encounter (INDEPENDENT_AMBULATORY_CARE_PROVIDER_SITE_OTHER): Payer: Self-pay | Admitting: Specialist

## 2017-03-07 ENCOUNTER — Ambulatory Visit (INDEPENDENT_AMBULATORY_CARE_PROVIDER_SITE_OTHER): Payer: BLUE CROSS/BLUE SHIELD | Admitting: Specialist

## 2017-03-07 VITALS — BP 117/74 | HR 87 | Ht 71.0 in | Wt 189.0 lb

## 2017-03-07 DIAGNOSIS — M48062 Spinal stenosis, lumbar region with neurogenic claudication: Secondary | ICD-10-CM | POA: Diagnosis not present

## 2017-03-07 DIAGNOSIS — M5136 Other intervertebral disc degeneration, lumbar region: Secondary | ICD-10-CM | POA: Diagnosis not present

## 2017-03-07 DIAGNOSIS — M5126 Other intervertebral disc displacement, lumbar region: Secondary | ICD-10-CM | POA: Diagnosis not present

## 2017-03-07 MED ORDER — OXYCODONE HCL 5 MG PO CAPS: 5.0000 mg | ORAL_CAPSULE | Freq: Four times a day (QID) | ORAL | 0 refills | Status: DC | PRN
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
## 2017-03-07 NOTE — Patient Instructions (Signed)
   Avoid bending, stooping and avoid lifting weights greater than 10 lbs. Avoid prolong standing and walking. Surgery scheduling secretary Tivis Ringer, will call you in the next week to schedule for surgery.  Surgery recommended is a bilateral lumbar decompressive laminectomy. Take oxycodone for for pain. Risk of surgery includes risk of infection 1 in 200 patients, bleeding 1/2% chance you would need a transfusion.   Risk to the nerves is one in 10,000.  Expect improved walking and standing tolerance. Expect relief of leg pain but numbness may persist depending on the length and degree of pressure that has been present.

## 2017-03-08 ENCOUNTER — Encounter (INDEPENDENT_AMBULATORY_CARE_PROVIDER_SITE_OTHER): Payer: Self-pay | Admitting: Specialist

## 2017-03-27 ENCOUNTER — Telehealth (INDEPENDENT_AMBULATORY_CARE_PROVIDER_SITE_OTHER): Payer: Self-pay | Admitting: Specialist

## 2017-03-27 ENCOUNTER — Other Ambulatory Visit (INDEPENDENT_AMBULATORY_CARE_PROVIDER_SITE_OTHER): Payer: Self-pay

## 2017-03-27 ENCOUNTER — Inpatient Hospital Stay (HOSPITAL_COMMUNITY): Admission: RE | Admit: 2017-03-27 | Payer: BLUE CROSS/BLUE SHIELD | Source: Ambulatory Visit

## 2017-03-27 NOTE — Telephone Encounter (Signed)
Beth from [facility] called wanting Dr. Otelia Sergeant to send over the pre surgical orders for the patient and also for them to be signed. Thank you. CB # [phone]

## 2017-03-27 NOTE — Progress Notes (Signed)
Spoke with Trula Ore in Dr. Barbaraann Faster office, requesting pre surgical orders. She will pass the need on to [facility] , Dr. Barbaraann Faster assistant.

## 2017-03-27 NOTE — Telephone Encounter (Signed)
Per Ralene Muskrat will will take care of this, surgery may get canclled due to patient may need more clearance

## 2017-03-28 ENCOUNTER — Inpatient Hospital Stay (HOSPITAL_COMMUNITY)
Admission: RE | Admit: 2017-03-28 | Discharge: 2017-03-28 | Disposition: A | Discharge status: Home / Self Care | Payer: BLUE CROSS/BLUE SHIELD | Source: Ambulatory Visit

## 2017-03-29 ENCOUNTER — Ambulatory Visit (HOSPITAL_COMMUNITY): Admission: RE | Admit: 2017-03-29 | Payer: BLUE CROSS/BLUE SHIELD | Source: Ambulatory Visit | Admitting: Specialist

## 2017-03-29 ENCOUNTER — Encounter (HOSPITAL_COMMUNITY): Admission: RE | Payer: Self-pay | Source: Ambulatory Visit

## 2017-03-29 SURGERY — LUMBAR LAMINECTOMY/DECOMPRESSION MICRODISCECTOMY
Anesthesia: General

## 2017-04-01 ENCOUNTER — Other Ambulatory Visit (INDEPENDENT_AMBULATORY_CARE_PROVIDER_SITE_OTHER): Payer: Self-pay | Admitting: Radiology

## 2017-04-01 MED ORDER — OXYCODONE HCL 5 MG PO TABS: 5.0000 mg | ORAL_TABLET | Freq: Four times a day (QID) | ORAL | 0 refills | Status: DC | PRN

## 2017-04-11 ENCOUNTER — Other Ambulatory Visit (INDEPENDENT_AMBULATORY_CARE_PROVIDER_SITE_OTHER): Payer: Self-pay | Admitting: Specialist

## 2017-04-11 NOTE — Addendum Note (Signed)
Addended by: Penne LashSHUE WILLS, Neysa BonitoHRISTY N on: 04/11/2017 04:02 PM   Modules accepted: Orders

## 2017-04-11 NOTE — Telephone Encounter (Signed)
Patient called needing a refill on Oxycodone. States he wont have enough to last him through the weekend. CB# [phone]  Also wanted to know if he should/shouldn't get the humira injection tomorrow (Friday 04/12/17)?

## 2017-04-12 MED ORDER — OXYCODONE HCL 5 MG PO TABS
5.0000 mg | ORAL_TABLET | Freq: Four times a day (QID) | ORAL | 0 refills | Status: DC | PRN
Start: 2017-04-12 — End: 2017-04-23

## 2017-04-12 NOTE — Telephone Encounter (Signed)
I called and advised pt that his rx is ready for pick up at the front desk. I also advised that per Dr. Otelia SergeantNitka he should not take the humira injection this evening.

## 2017-04-16 ENCOUNTER — Encounter (HOSPITAL_COMMUNITY): Payer: Self-pay

## 2017-04-16 ENCOUNTER — Encounter (HOSPITAL_COMMUNITY)
Admission: RE | Admit: 2017-04-16 | Discharge: 2017-04-16 | Disposition: A | Discharge status: Home / Self Care | Payer: BLUE CROSS/BLUE SHIELD | Source: Ambulatory Visit | Attending: Specialist | Admitting: Specialist

## 2017-04-16 ENCOUNTER — Other Ambulatory Visit (HOSPITAL_COMMUNITY): Payer: Self-pay | Admitting: *Deleted

## 2017-04-16 DIAGNOSIS — J45909 Unspecified asthma, uncomplicated: Secondary | ICD-10-CM | POA: Diagnosis not present

## 2017-04-16 DIAGNOSIS — Z01812 Encounter for preprocedural laboratory examination: Secondary | ICD-10-CM | POA: Diagnosis not present

## 2017-04-16 DIAGNOSIS — G4733 Obstructive sleep apnea (adult) (pediatric): Secondary | ICD-10-CM | POA: Insufficient documentation

## 2017-04-16 DIAGNOSIS — F1011 Alcohol abuse, in remission: Secondary | ICD-10-CM | POA: Diagnosis not present

## 2017-04-16 DIAGNOSIS — I10 Essential (primary) hypertension: Secondary | ICD-10-CM | POA: Diagnosis not present

## 2017-04-16 DIAGNOSIS — L732 Hidradenitis suppurativa: Secondary | ICD-10-CM | POA: Insufficient documentation

## 2017-04-16 DIAGNOSIS — F17200 Nicotine dependence, unspecified, uncomplicated: Secondary | ICD-10-CM | POA: Insufficient documentation

## 2017-04-16 DIAGNOSIS — Z01818 Encounter for other preprocedural examination: Secondary | ICD-10-CM | POA: Diagnosis present

## 2017-04-16 DIAGNOSIS — D649 Anemia, unspecified: Secondary | ICD-10-CM | POA: Diagnosis not present

## 2017-04-16 HISTORY — DX: Headache: R51

## 2017-04-16 HISTORY — DX: Adverse effect of unspecified anesthetic, initial encounter: T41.45XA

## 2017-04-16 HISTORY — DX: Other complications of anesthesia, initial encounter: T88.59XA

## 2017-04-16 HISTORY — DX: Gastro-esophageal reflux disease without esophagitis: K21.9

## 2017-04-16 HISTORY — DX: Headache, unspecified: R51.9

## 2017-04-16 HISTORY — DX: Anemia, unspecified: D64.9

## 2017-04-16 LAB — COMPREHENSIVE METABOLIC PANEL
ALT: 20 U/L (ref 17–63)
ANION GAP: 8 (ref 5–15)
AST: 38 U/L (ref 15–41)
Albumin: 3.4 g/dL — ABNORMAL LOW (ref 3.5–5.0)
Alkaline Phosphatase: 93 U/L (ref 38–126)
BUN: 12 mg/dL (ref 6–20)
CHLORIDE: 104 mmol/L (ref 101–111)
CO2: 20 mmol/L — AB (ref 22–32)
Calcium: 9.8 mg/dL (ref 8.9–10.3)
Creatinine, Ser: 0.89 mg/dL (ref 0.61–1.24)
GFR calc non Af Amer: >60 mL/min (ref >60)
Glucose, Bld: 100 mg/dL — ABNORMAL HIGH (ref 65–99)
Potassium: 4.5 mmol/L (ref 3.5–5.1)
SODIUM: 132 mmol/L — AB (ref 135–145)
Total Bilirubin: 0.9 mg/dL (ref 0.3–1.2)
Total Protein: 9.2 g/dL — ABNORMAL HIGH (ref 6.5–8.1)

## 2017-04-16 LAB — URINALYSIS, ROUTINE W REFLEX MICROSCOPIC
Bilirubin Urine: NEGATIVE
Glucose, UA: NEGATIVE mg/dL
Hgb urine dipstick: NEGATIVE
Ketones, ur: NEGATIVE mg/dL
LEUKOCYTES UA: NEGATIVE
NITRITE: NEGATIVE
PROTEIN: NEGATIVE mg/dL
Specific Gravity, Urine: 1.003 — ABNORMAL LOW (ref 1.005–1.030)
pH: 7 (ref 5.0–8.0)

## 2017-04-16 LAB — CBC
HCT: 33.9 % — ABNORMAL LOW (ref 39.0–52.0)
Hemoglobin: 11.3 g/dL — ABNORMAL LOW (ref 13.0–17.0)
MCH: 27.3 pg (ref 26.0–34.0)
MCHC: 33.3 g/dL (ref 30.0–36.0)
MCV: 81.9 fL (ref 78.0–100.0)
PLATELETS: 251 10*3/uL (ref 150–400)
RBC: 4.14 MIL/uL — AB (ref 4.22–5.81)
RDW: 17.2 % — ABNORMAL HIGH (ref 11.5–15.5)
WBC: 9.5 10*3/uL (ref 4.0–10.5)

## 2017-04-16 LAB — APTT: APTT: 44 s — AB (ref 24–36)

## 2017-04-16 LAB — PROTIME-INR
INR: 1.4
Prothrombin Time: 17.3 seconds — ABNORMAL HIGH (ref 11.4–15.2)

## 2017-04-16 LAB — SURGICAL PCR SCREEN
MRSA, PCR: NEGATIVE
STAPHYLOCOCCUS AUREUS: NEGATIVE

## 2017-04-16 NOTE — Pre-Procedure Instructions (Signed)
Michael [name]  04/16/2017      [facility] - [place], [place] - [address] [place] [address] Phone: [phone] Fax: [phone]  [facility] - [place], [place] - [address] [place] [address] Phone: [phone] Fax: [phone]    Your procedure is scheduled on 04-19-2017 Friday   [facility] Admitting at 10:30 A.M.   Call this number if you have problems the morning of surgery:  [phone]   Remember:  Do not eat food or drink liquids after midnight.   Take these medicines the morning of surgery with A SIP OF WATER Adderall,hydroxyzine/atarax/Visteril.omeprazole/prilosec,Oxycodone if needed for pain,systane eye drops if needed,potassium chloride,ProAor inhaler,propranolol/Inderal,    STOP ASPIRIN,ANTIINFLAMATORIES (IBUPROFEN,ALEVE,MOTRIN,ADVIL,GOODY'S POWDERS),HERBAL SUPPLEMENTS,FISH OIL,AND VITAMINS 5-7 DAYS PRIOR TO SURGERY   Do not wear jewelry, make-up or nail polish.  Do not wear lotions, powders, or perfumes, or deoderant.  Do not shave 48 hours prior to surgery.  Men may shave face and neck.  Do not bring valuables to the hospital.  [facility] is not responsible for any belongings or valuables.  Contacts, dentures or bridgework may not be worn into surgery.  Leave your suitcase in the car.  After surgery it may be brought to your room.  For patients admitted to the hospital, discharge time will be determined by your treatment team.  Patients discharged the day of surgery will not be allowed to drive home.   Special Instructions: [facility] - Preparing for Surgery  Before surgery, you can play an important role.  Because skin is not sterile, your skin needs to be as free of germs as possible.  You can reduce the number of germs on you skin by washing with CHG (chlorahexidine gluconate) soap before surgery.   CHG is an antiseptic cleaner which kills germs and bonds with the skin to continue killing germs even after washing.  Please DO NOT use if you have an allergy to CHG or antibacterial soaps.  If your skin becomes reddened/irritated stop using the CHG and inform your nurse when you arrive at Short Stay.  Do not shave (including legs and underarms) for at least 48 hours prior to the first CHG shower.  You may shave your face.  Please follow these instructions carefully:   1.  Shower with CHG Soap the night before surgery and the   morning of Surgery.  2.  If you choose to wash your hair, wash your hair first as usual with your normal shampoo.  3.  After you shampoo, rinse your hair and body thoroughly to remove the  Shampoo.  4.  Use CHG as you would any other liquid soap.  You can apply chg directly  to the skin and wash gently with scrungie or a clean washcloth.  5.  Apply the CHG Soap to your body ONLY FROM THE NECK DOWN.   Do not use on open wounds or open sores.  Avoid contact with your eyes,  ears, mouth and genitals (private parts).  Wash genitals (private parts) with your normal soap.  6.  Wash thoroughly, paying special attention to the area where your surgery will be performed.  7.  Thoroughly rinse your body with warm water from the neck down.  8.  DO NOT shower/wash with your normal soap after using and rinsing o  the CHG Soap.  9.  Pat yourself dry with a clean towel.  10.  Wear clean pajamas.            11.  Place clean sheets on your bed the night of your first shower and do not sleep with pets.  Day of Surgery  Do not apply any lotions/deodorants the morning of surgery.  Please wear clean clothes to the hospital/surgery center.   Please read over the following fact sheets that you were given. MRSA Information and Surgical Site Infection Prevention

## 2017-04-16 NOTE — Progress Notes (Signed)
No cardiac history     [facility] hematologist  [facility]

## 2017-04-17 ENCOUNTER — Telehealth (INDEPENDENT_AMBULATORY_CARE_PROVIDER_SITE_OTHER): Payer: Self-pay

## 2017-04-17 NOTE — Telephone Encounter (Signed)
Marylene LandAngela, NP at short stay left voice mail to make sure you saw PT 17.3 and PTT 44.

## 2017-04-17 NOTE — Progress Notes (Signed)
Anesthesia Chart Review:  Pt is a 51 year old male scheduled for bilateral L4-5 lumbar decompression with possible discectomy on 04/19/2017 with Basil Dess, MD  - PCP is Hayden Rasmussen, MD who has cleared pt for surgery (notes in care everywhere) - Hematologist is Roxana Hires, MD who has cleared pt for surgery (notes in care everywhere)  PMH includes:  HTN, OSA, asthma, anemia (of chronic inflammation), hidradenitis suppurativa. Alcohol abuse (was drinking bottle of wine per day). Current smoker. BMI 26. S/p colon resection 1989. S/p small intestine surgery.   - Hospitalized 2/26-01/24/17 for upper GI bleed. Complicated by acute blood loss anemia (required transfusion), hematemesis, and alcohol abuse.   Anesthesia history includes: pt reported at PAT he "woke up" during last 3 surgeries. Had EGD under MAC at [facility] 01/22/17 and appeared to have no issues.   Medications include Humira (on hold for surgery), Adderall, Cialis, lisinopril-HCTZ, Prilosec, potassium, albuterol, propranolol  BP 114/62   Pulse 86   Temp 36.8 C   Resp 20   Ht 5' 10.5" (1.791 m)   Wt 182 lb (82.6 kg)   SpO2 100%   BMI 25.75 kg/m    Preoperative labs reviewed.  PT 17.3. PTT 44. LFTs normal, platelets normal. I notified Sherrie in Dr. Otho Ket office; will repeat PT/PTT DOS.   CXR 12/05/16: No active cardiopulmonary disease.  EKG 01/22/17: Sinus tachycardia (129 bpm). Left axis deviation. RSR' in V1 or V2, probably normal variant - Done in ED in the context of GI bleed, hematemesis.   If labs acceptable DOS, I anticipate pt can proceed as scheduled.   Willeen Cass, FNP-BC [facility] Short Stay Surgical Center/Anesthesiology Phone: [phone] 04/17/2017 2:44 PM

## 2017-04-17 NOTE — Telephone Encounter (Signed)
Discussed with Michael [name] the results of his laboratory PT and PTT of 44. He is aware of the anemia and a work up has been done by [facility] and his primary care [facility]. He reports that he was cleared after being seen by his primary care 5/14 and hematology 5/17. I will call primary care to discuss tomorrow and call him afterwards to discuss further. For now he should remain on the schedule. jen

## 2017-04-18 NOTE — Telephone Encounter (Signed)
Ok,noted

## 2017-04-18 NOTE — Telephone Encounter (Signed)
I called and discussed the mild coagulopathy slight elevation of PT and PTT with Michael [name]'s Hematologist/Oncologist at [facility], Dr. Gretel AcreBerenzon, he indicated no contraindication to surgery, use FFP if there is  Concern of bleeding, he does not know of a contraindication to using TXA if necessary.

## 2017-04-19 ENCOUNTER — Ambulatory Visit (INDEPENDENT_AMBULATORY_CARE_PROVIDER_SITE_OTHER): Payer: BLUE CROSS/BLUE SHIELD | Admitting: Specialist

## 2017-04-19 ENCOUNTER — Encounter (INDEPENDENT_AMBULATORY_CARE_PROVIDER_SITE_OTHER): Payer: Self-pay | Admitting: Specialist

## 2017-04-19 DIAGNOSIS — M48062 Spinal stenosis, lumbar region with neurogenic claudication: Secondary | ICD-10-CM

## 2017-04-19 DIAGNOSIS — L732 Hidradenitis suppurativa: Secondary | ICD-10-CM | POA: Diagnosis not present

## 2017-04-19 MED ORDER — DOXYCYCLINE HYCLATE 100 MG PO TABS: 100.0000 mg | ORAL_TABLET | Freq: Two times a day (BID) | ORAL | 0 refills | Status: DC

## 2017-04-19 NOTE — Patient Instructions (Addendum)
Avoid bending, stooping and avoid lifting weights greater than 10 lbs. Avoid prolong standing and walking. Avoid frequent bending and stooping  No lifting greater than 10 lbs. May use ice or moist heat for pain. Weight loss is of benefit. Handicap license is approved. Doxycycline is renewed, will reschedule surgery for Tuesday AM to perform the lumbar laminectomy. Should call his primary call that assists him locally with draining of the hydadenosis lesions and have further  Drainage.
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
## 2017-04-23 ENCOUNTER — Encounter (HOSPITAL_COMMUNITY): Payer: Self-pay | Admitting: Urology

## 2017-04-23 ENCOUNTER — Ambulatory Visit (HOSPITAL_COMMUNITY): Payer: BLUE CROSS/BLUE SHIELD | Admitting: Emergency Medicine

## 2017-04-23 ENCOUNTER — Ambulatory Visit (HOSPITAL_COMMUNITY): Payer: BLUE CROSS/BLUE SHIELD

## 2017-04-23 ENCOUNTER — Encounter (HOSPITAL_COMMUNITY)
Admission: RE | Disposition: A | Discharge status: Home / Self Care | Payer: Self-pay | Source: Ambulatory Visit | Attending: Specialist

## 2017-04-23 ENCOUNTER — Observation Stay (HOSPITAL_COMMUNITY)
Admission: RE | Admit: 2017-04-23 | Discharge: 2017-04-24 | Disposition: A | Discharge status: Home / Self Care | Payer: BLUE CROSS/BLUE SHIELD | Source: Ambulatory Visit | Attending: Specialist | Admitting: Specialist

## 2017-04-23 DIAGNOSIS — M48062 Spinal stenosis, lumbar region with neurogenic claudication: Secondary | ICD-10-CM

## 2017-04-23 DIAGNOSIS — M48061 Spinal stenosis, lumbar region without neurogenic claudication: Secondary | ICD-10-CM | POA: Diagnosis present

## 2017-04-23 DIAGNOSIS — F419 Anxiety disorder, unspecified: Secondary | ICD-10-CM | POA: Diagnosis not present

## 2017-04-23 DIAGNOSIS — R51 Headache: Secondary | ICD-10-CM | POA: Insufficient documentation

## 2017-04-23 DIAGNOSIS — G9600 Cerebrospinal fluid leak, unspecified: Secondary | ICD-10-CM | POA: Diagnosis not present

## 2017-04-23 DIAGNOSIS — J45909 Unspecified asthma, uncomplicated: Secondary | ICD-10-CM | POA: Insufficient documentation

## 2017-04-23 DIAGNOSIS — F329 Major depressive disorder, single episode, unspecified: Secondary | ICD-10-CM | POA: Insufficient documentation

## 2017-04-23 DIAGNOSIS — I10 Essential (primary) hypertension: Secondary | ICD-10-CM | POA: Insufficient documentation

## 2017-04-23 DIAGNOSIS — Z79899 Other long term (current) drug therapy: Secondary | ICD-10-CM | POA: Diagnosis not present

## 2017-04-23 DIAGNOSIS — M1990 Unspecified osteoarthritis, unspecified site: Secondary | ICD-10-CM | POA: Insufficient documentation

## 2017-04-23 DIAGNOSIS — Y753 Surgical instruments, materials and neurological devices (including sutures) associated with adverse incidents: Secondary | ICD-10-CM | POA: Insufficient documentation

## 2017-04-23 DIAGNOSIS — G9782 Other postprocedural complications and disorders of nervous system: Secondary | ICD-10-CM | POA: Diagnosis not present

## 2017-04-23 DIAGNOSIS — F17200 Nicotine dependence, unspecified, uncomplicated: Secondary | ICD-10-CM | POA: Insufficient documentation

## 2017-04-23 DIAGNOSIS — K219 Gastro-esophageal reflux disease without esophagitis: Secondary | ICD-10-CM | POA: Insufficient documentation

## 2017-04-23 DIAGNOSIS — M5126 Other intervertebral disc displacement, lumbar region: Secondary | ICD-10-CM | POA: Insufficient documentation

## 2017-04-23 DIAGNOSIS — G4700 Insomnia, unspecified: Secondary | ICD-10-CM | POA: Diagnosis not present

## 2017-04-23 DIAGNOSIS — D62 Acute posthemorrhagic anemia: Secondary | ICD-10-CM | POA: Diagnosis not present

## 2017-04-23 DIAGNOSIS — Z419 Encounter for procedure for purposes other than remedying health state, unspecified: Secondary | ICD-10-CM

## 2017-04-23 DIAGNOSIS — G4730 Sleep apnea, unspecified: Secondary | ICD-10-CM | POA: Insufficient documentation

## 2017-04-23 HISTORY — PX: LUMBAR LAMINECTOMY/DECOMPRESSION MICRODISCECTOMY: SHX5026

## 2017-04-23 LAB — PROTIME-INR
INR: 1.4
PROTHROMBIN TIME: 17.3 s — AB (ref 11.4–15.2)

## 2017-04-23 LAB — APTT: aPTT: 41 seconds — ABNORMAL HIGH (ref 24–36)

## 2017-04-23 SURGERY — LUMBAR LAMINECTOMY/DECOMPRESSION MICRODISCECTOMY
Anesthesia: General | Site: Back

## 2017-04-23 MED ORDER — LISINOPRIL 20 MG PO TABS
20.0000 mg | ORAL_TABLET | Freq: Every day | ORAL | Status: DC
  Administered 2017-04-24: 20 mg via ORAL
  Filled 2017-04-23: qty 1

## 2017-04-23 MED ORDER — DOXYCYCLINE HYCLATE 100 MG PO TABS
100.0000 mg | ORAL_TABLET | Freq: Two times a day (BID) | ORAL | Status: DC
  Administered 2017-04-23 – 2017-04-24 (×2): 100 mg via ORAL
  Filled 2017-04-23 (×2): qty 1

## 2017-04-23 MED ORDER — MIDAZOLAM HCL 2 MG/2ML IJ SOLN
INTRAMUSCULAR | Status: DC | PRN
  Administered 2017-04-23: 2 mg via INTRAVENOUS

## 2017-04-23 MED ORDER — VANCOMYCIN HCL IN DEXTROSE 1-5 GM/200ML-% IV SOLN
1000.0000 mg | Freq: Once | INTRAVENOUS | Status: AC
  Administered 2017-04-23: 1000 mg via INTRAVENOUS
  Filled 2017-04-23: qty 200

## 2017-04-23 MED ORDER — PHENOL 1.4 % MT LIQD: 1.0000 | OROMUCOSAL | Status: DC | PRN

## 2017-04-23 MED ORDER — TADALAFIL 5 MG PO TABS: 5.0000 mg | ORAL_TABLET | Freq: Every day | ORAL | Status: DC | PRN

## 2017-04-23 MED ORDER — SUGAMMADEX SODIUM 200 MG/2ML IV SOLN
INTRAVENOUS | Status: DC | PRN
  Administered 2017-04-23: 200 mg via INTRAVENOUS

## 2017-04-23 MED ORDER — MENTHOL 3 MG MT LOZG: 1.0000 | LOZENGE | OROMUCOSAL | Status: DC | PRN

## 2017-04-23 MED ORDER — BISACODYL 5 MG PO TBEC: 5.0000 mg | DELAYED_RELEASE_TABLET | Freq: Every day | ORAL | Status: DC | PRN

## 2017-04-23 MED ORDER — ONDANSETRON HCL 4 MG/2ML IJ SOLN
INTRAMUSCULAR | Status: DC | PRN
  Administered 2017-04-23: 4 mg via INTRAVENOUS

## 2017-04-23 MED ORDER — BUPIVACAINE LIPOSOME 1.3 % IJ SUSP
INTRAMUSCULAR | Status: DC | PRN
  Administered 2017-04-23: 10 mL

## 2017-04-23 MED ORDER — PROPOFOL 10 MG/ML IV BOLUS
INTRAVENOUS | Status: DC | PRN
Start: 2017-04-23 — End: 2017-04-23
  Administered 2017-04-23: 120 mg via INTRAVENOUS

## 2017-04-23 MED ORDER — OXYCODONE HCL 5 MG PO TABS
ORAL_TABLET | ORAL | Status: AC
  Filled 2017-04-23: qty 2

## 2017-04-23 MED ORDER — LEVOCETIRIZINE DIHYDROCHLORIDE 5 MG PO TABS: 5.0000 mg | ORAL_TABLET | Freq: Every day | ORAL | Status: DC

## 2017-04-23 MED ORDER — PROPRANOLOL HCL 10 MG PO TABS
10.0000 mg | ORAL_TABLET | Freq: Two times a day (BID) | ORAL | Status: DC
  Administered 2017-04-23: 10 mg via ORAL
  Filled 2017-04-23 (×2): qty 1

## 2017-04-23 MED ORDER — LACTATED RINGERS IV SOLN
INTRAVENOUS | Status: DC | PRN
  Administered 2017-04-23 (×2): via INTRAVENOUS

## 2017-04-23 MED ORDER — HYPROMELLOSE (GONIOSCOPIC) 2.5 % OP SOLN
1.0000 [drp] | Freq: Three times a day (TID) | OPHTHALMIC | Status: DC | PRN
  Filled 2017-04-23: qty 15

## 2017-04-23 MED ORDER — PHENYLEPHRINE 40 MCG/ML (10ML) SYRINGE FOR IV PUSH (FOR BLOOD PRESSURE SUPPORT)
PREFILLED_SYRINGE | INTRAVENOUS | Status: AC
  Filled 2017-04-23: qty 10

## 2017-04-23 MED ORDER — POLYETHYLENE GLYCOL 3350 17 G PO PACK: 17.0000 g | PACK | Freq: Every day | ORAL | Status: DC | PRN

## 2017-04-23 MED ORDER — HYDROCHLOROTHIAZIDE 12.5 MG PO CAPS
12.5000 mg | ORAL_CAPSULE | Freq: Every day | ORAL | Status: DC
  Administered 2017-04-24: 12.5 mg via ORAL
  Filled 2017-04-23: qty 1

## 2017-04-23 MED ORDER — LISINOPRIL-HYDROCHLOROTHIAZIDE 20-12.5 MG PO TABS: 2.0000 | ORAL_TABLET | Freq: Every day | ORAL | Status: DC

## 2017-04-23 MED ORDER — ACETAMINOPHEN 325 MG PO TABS
650.0000 mg | ORAL_TABLET | ORAL | Status: DC | PRN
  Administered 2017-04-23: 650 mg via ORAL

## 2017-04-23 MED ORDER — POTASSIUM CHLORIDE CRYS ER 10 MEQ PO TBCR
10.0000 meq | EXTENDED_RELEASE_TABLET | Freq: Every day | ORAL | Status: DC
Start: 2017-04-24 — End: 2017-04-24
  Administered 2017-04-24: 10 meq via ORAL
  Filled 2017-04-23: qty 1

## 2017-04-23 MED ORDER — ROCURONIUM BROMIDE 10 MG/ML (PF) SYRINGE
PREFILLED_SYRINGE | INTRAVENOUS | Status: AC
  Filled 2017-04-23: qty 5

## 2017-04-23 MED ORDER — HEMOSTATIC AGENTS (NO CHARGE) OPTIME
TOPICAL | Status: DC | PRN
  Administered 2017-04-23: 1 via TOPICAL

## 2017-04-23 MED ORDER — FENTANYL CITRATE (PF) 100 MCG/2ML IJ SOLN
INTRAMUSCULAR | Status: AC
  Filled 2017-04-23: qty 2

## 2017-04-23 MED ORDER — FLEET ENEMA 7-19 GM/118ML RE ENEM: 1.0000 | ENEMA | Freq: Once | RECTAL | Status: DC | PRN

## 2017-04-23 MED ORDER — ALBUTEROL SULFATE (2.5 MG/3ML) 0.083% IN NEBU: 3.0000 mL | INHALATION_SOLUTION | RESPIRATORY_TRACT | Status: DC | PRN

## 2017-04-23 MED ORDER — ROCURONIUM BROMIDE 10 MG/ML (PF) SYRINGE
PREFILLED_SYRINGE | INTRAVENOUS | Status: DC | PRN
  Administered 2017-04-23: 10 mg via INTRAVENOUS
  Administered 2017-04-23: 30 mg via INTRAVENOUS
  Administered 2017-04-23: 20 mg via INTRAVENOUS

## 2017-04-23 MED ORDER — ZOLPIDEM TARTRATE 5 MG PO TABS
15.0000 mg | ORAL_TABLET | Freq: Every day | ORAL | Status: DC
  Administered 2017-04-23: 15 mg via ORAL
  Filled 2017-04-23: qty 3

## 2017-04-23 MED ORDER — PHENYLEPHRINE 40 MCG/ML (10ML) SYRINGE FOR IV PUSH (FOR BLOOD PRESSURE SUPPORT)
PREFILLED_SYRINGE | INTRAVENOUS | Status: DC | PRN
  Administered 2017-04-23 (×2): 80 ug via INTRAVENOUS
  Administered 2017-04-23: 40 ug via INTRAVENOUS

## 2017-04-23 MED ORDER — ACETAMINOPHEN 325 MG PO TABS
ORAL_TABLET | ORAL | Status: AC
  Filled 2017-04-23: qty 2

## 2017-04-23 MED ORDER — BIOTRUE SOLN: 1.0000 [drp] | Freq: Three times a day (TID) | Status: DC | PRN

## 2017-04-23 MED ORDER — OXYCODONE HCL ER 10 MG PO T12A: 10.0000 mg | EXTENDED_RELEASE_TABLET | Freq: Two times a day (BID) | ORAL | 0 refills | Status: DC

## 2017-04-23 MED ORDER — OXYCODONE HCL 5 MG PO TABS
5.0000 mg | ORAL_TABLET | ORAL | Status: DC | PRN
  Administered 2017-04-23 – 2017-04-24 (×6): 10 mg via ORAL
  Filled 2017-04-23 (×5): qty 2

## 2017-04-23 MED ORDER — THROMBIN 20000 UNITS EX SOLR
CUTANEOUS | Status: AC
  Filled 2017-04-23: qty 20000

## 2017-04-23 MED ORDER — OXYCODONE HCL 5 MG PO TABS: 5.0000 mg | ORAL_TABLET | ORAL | 0 refills | Status: DC | PRN

## 2017-04-23 MED ORDER — FENTANYL CITRATE (PF) 250 MCG/5ML IJ SOLN
INTRAMUSCULAR | Status: AC
  Filled 2017-04-23: qty 5

## 2017-04-23 MED ORDER — POLYVINYL ALCOHOL 1.4 % OP SOLN
1.0000 [drp] | Freq: Three times a day (TID) | OPHTHALMIC | Status: DC | PRN
  Filled 2017-04-23: qty 15

## 2017-04-23 MED ORDER — VANCOMYCIN HCL IN DEXTROSE 1-5 GM/200ML-% IV SOLN
1000.0000 mg | INTRAVENOUS | Status: AC
  Administered 2017-04-23: 1000 mg via INTRAVENOUS
  Filled 2017-04-23: qty 200

## 2017-04-23 MED ORDER — DOCUSATE SODIUM 100 MG PO CAPS
100.0000 mg | ORAL_CAPSULE | Freq: Two times a day (BID) | ORAL | Status: DC
  Administered 2017-04-23 – 2017-04-24 (×2): 100 mg via ORAL
  Filled 2017-04-23 (×2): qty 1

## 2017-04-23 MED ORDER — DEXTROSE 5 % IV SOLN
500.0000 mg | Freq: Four times a day (QID) | INTRAVENOUS | Status: DC | PRN
  Filled 2017-04-23: qty 5

## 2017-04-23 MED ORDER — SODIUM CHLORIDE 0.9% FLUSH: 3.0000 mL | INTRAVENOUS | Status: DC | PRN

## 2017-04-23 MED ORDER — OXYCODONE HCL ER 10 MG PO T12A
10.0000 mg | EXTENDED_RELEASE_TABLET | Freq: Two times a day (BID) | ORAL | Status: DC
  Administered 2017-04-23 – 2017-04-24 (×2): 10 mg via ORAL
  Filled 2017-04-23 (×2): qty 1

## 2017-04-23 MED ORDER — 0.9 % SODIUM CHLORIDE (POUR BTL) OPTIME
TOPICAL | Status: DC | PRN
  Administered 2017-04-23: 1000 mL

## 2017-04-23 MED ORDER — GABAPENTIN 300 MG PO CAPS
300.0000 mg | ORAL_CAPSULE | Freq: Every day | ORAL | Status: DC
  Administered 2017-04-23: 300 mg via ORAL
  Filled 2017-04-23: qty 1

## 2017-04-23 MED ORDER — BUPIVACAINE HCL (PF) 0.5 % IJ SOLN
INTRAMUSCULAR | Status: AC
  Filled 2017-04-23: qty 30

## 2017-04-23 MED ORDER — CHLORHEXIDINE GLUCONATE 4 % EX LIQD: 60.0000 mL | Freq: Once | CUTANEOUS | Status: DC

## 2017-04-23 MED ORDER — THROMBIN 20000 UNITS EX SOLR
CUTANEOUS | Status: DC | PRN
  Administered 2017-04-23: 20 mL via TOPICAL

## 2017-04-23 MED ORDER — SODIUM CHLORIDE 0.9% FLUSH
3.0000 mL | Freq: Two times a day (BID) | INTRAVENOUS | Status: DC
  Administered 2017-04-23: 3 mL via INTRAVENOUS

## 2017-04-23 MED ORDER — FENTANYL CITRATE (PF) 250 MCG/5ML IJ SOLN
INTRAMUSCULAR | Status: DC | PRN
  Administered 2017-04-23 (×2): 50 ug via INTRAVENOUS
  Administered 2017-04-23 (×2): 25 ug via INTRAVENOUS
  Administered 2017-04-23: 50 ug via INTRAVENOUS
  Administered 2017-04-23: 100 ug via INTRAVENOUS

## 2017-04-23 MED ORDER — SILVER SULFADIAZINE 1 % EX CREA
1.0000 "application " | TOPICAL_CREAM | Freq: Every day | CUTANEOUS | Status: DC
  Filled 2017-04-23: qty 85

## 2017-04-23 MED ORDER — SODIUM CHLORIDE 0.9 % IV SOLN: 250.0000 mL | INTRAVENOUS | Status: DC

## 2017-04-23 MED ORDER — MIDAZOLAM HCL 2 MG/2ML IJ SOLN
INTRAMUSCULAR | Status: AC
  Filled 2017-04-23: qty 2

## 2017-04-23 MED ORDER — BUPIVACAINE LIPOSOME 1.3 % IJ SUSP
20.0000 mL | INTRAMUSCULAR | Status: DC
Start: 2017-04-23 — End: 2017-04-23
  Filled 2017-04-23: qty 20

## 2017-04-23 MED ORDER — LIDOCAINE 2% (20 MG/ML) 5 ML SYRINGE
INTRAMUSCULAR | Status: DC | PRN
Start: 2017-04-23 — End: 2017-04-23
  Administered 2017-04-23: 100 mg via INTRAVENOUS

## 2017-04-23 MED ORDER — METHOCARBAMOL 500 MG PO TABS
500.0000 mg | ORAL_TABLET | Freq: Four times a day (QID) | ORAL | Status: DC | PRN
  Administered 2017-04-23 – 2017-04-24 (×3): 500 mg via ORAL
  Filled 2017-04-23 (×2): qty 1

## 2017-04-23 MED ORDER — HYDROXYZINE HCL 25 MG PO TABS
25.0000 mg | ORAL_TABLET | Freq: Three times a day (TID) | ORAL | Status: DC
  Administered 2017-04-23 – 2017-04-24 (×3): 25 mg via ORAL
  Filled 2017-04-23 (×3): qty 1

## 2017-04-23 MED ORDER — DEXAMETHASONE SODIUM PHOSPHATE 10 MG/ML IJ SOLN
INTRAMUSCULAR | Status: AC
  Filled 2017-04-23: qty 1

## 2017-04-23 MED ORDER — BUPIVACAINE HCL 0.5 % IJ SOLN
INTRAMUSCULAR | Status: DC | PRN
  Administered 2017-04-23: 10 mL

## 2017-04-23 MED ORDER — AMPHETAMINE-DEXTROAMPHETAMINE 10 MG PO TABS
20.0000 mg | ORAL_TABLET | Freq: Two times a day (BID) | ORAL | Status: DC
  Administered 2017-04-24 (×2): 20 mg via ORAL
  Filled 2017-04-23 (×2): qty 2

## 2017-04-23 MED ORDER — FENTANYL CITRATE (PF) 250 MCG/5ML IJ SOLN
INTRAMUSCULAR | Status: AC
Start: 2017-04-23 — End: 2017-04-23
  Filled 2017-04-23: qty 5

## 2017-04-23 MED ORDER — METHOCARBAMOL 500 MG PO TABS
ORAL_TABLET | ORAL | Status: AC
  Filled 2017-04-23: qty 1

## 2017-04-23 MED ORDER — PANTOPRAZOLE SODIUM 40 MG PO TBEC
80.0000 mg | DELAYED_RELEASE_TABLET | Freq: Every day | ORAL | Status: DC
Start: 2017-04-24 — End: 2017-04-24
  Administered 2017-04-24: 80 mg via ORAL
  Filled 2017-04-23: qty 2

## 2017-04-23 MED ORDER — FENTANYL CITRATE (PF) 100 MCG/2ML IJ SOLN
25.0000 ug | INTRAMUSCULAR | Status: DC | PRN
  Administered 2017-04-23: 50 ug via INTRAVENOUS

## 2017-04-23 MED ORDER — ONDANSETRON HCL 4 MG/2ML IJ SOLN: 4.0000 mg | Freq: Four times a day (QID) | INTRAMUSCULAR | Status: DC | PRN

## 2017-04-23 MED ORDER — CLINDAMYCIN PHOSPHATE 1 % EX FOAM: 1.0000 "application " | Freq: Every day | CUTANEOUS | Status: DC

## 2017-04-23 MED ORDER — ACETAMINOPHEN 650 MG RE SUPP: 650.0000 mg | RECTAL | Status: DC | PRN

## 2017-04-23 MED ORDER — SODIUM CHLORIDE 0.9 % IV SOLN
INTRAVENOUS | Status: DC
  Administered 2017-04-23: 20:00:00 via INTRAVENOUS

## 2017-04-23 MED ORDER — ONDANSETRON HCL 4 MG PO TABS: 4.0000 mg | ORAL_TABLET | Freq: Four times a day (QID) | ORAL | Status: DC | PRN

## 2017-04-23 MED ORDER — ALBUMIN HUMAN 5 % IV SOLN
INTRAVENOUS | Status: DC | PRN
  Administered 2017-04-23 (×2): via INTRAVENOUS

## 2017-04-23 MED ORDER — DEXAMETHASONE SODIUM PHOSPHATE 10 MG/ML IJ SOLN
INTRAMUSCULAR | Status: DC | PRN
  Administered 2017-04-23: 10 mg via INTRAVENOUS

## 2017-04-23 MED ORDER — METHOCARBAMOL 500 MG PO TABS: 500.0000 mg | ORAL_TABLET | Freq: Four times a day (QID) | ORAL | 1 refills | Status: DC | PRN

## 2017-04-23 MED ORDER — ALUM & MAG HYDROXIDE-SIMETH 200-200-20 MG/5ML PO SUSP
30.0000 mL | Freq: Four times a day (QID) | ORAL | Status: DC | PRN
Start: 2017-04-23 — End: 2017-04-24

## 2017-04-23 MED ORDER — ALPRAZOLAM 0.5 MG PO TABS: 0.5000 mg | ORAL_TABLET | Freq: Every evening | ORAL | Status: DC | PRN

## 2017-04-23 MED ORDER — AMPHETAMINE-DEXTROAMPHETAMINE 20 MG PO TABS: 20.0000 mg | ORAL_TABLET | Freq: Two times a day (BID) | ORAL | Status: DC

## 2017-04-23 MED ORDER — PHENYLEPHRINE HCL 10 MG/ML IJ SOLN
INTRAVENOUS | Status: DC | PRN
  Administered 2017-04-23: 25 ug/min via INTRAVENOUS

## 2017-04-23 MED ORDER — ADULT MULTIVITAMIN W/MINERALS CH
1.0000 | ORAL_TABLET | Freq: Every day | ORAL | Status: DC
  Administered 2017-04-24: 1 via ORAL
  Filled 2017-04-23: qty 1

## 2017-04-23 SURGICAL SUPPLY — 56 items
BUR RND FLUTED 2.5 (BURR) IMPLANT
BUR SABER RD CUTTING 3.0 (BURR) ×2 IMPLANT
CANISTER SUCT 3000ML PPV (MISCELLANEOUS) ×2 IMPLANT
COVER SURGICAL LIGHT HANDLE (MISCELLANEOUS) ×2 IMPLANT
DERMABOND ADVANCED (GAUZE/BANDAGES/DRESSINGS) ×1
DERMABOND ADVANCED .7 DNX12 (GAUZE/BANDAGES/DRESSINGS) ×1 IMPLANT
DRAPE HALF SHEET 40X57 (DRAPES) ×4 IMPLANT
DRAPE INCISE IOBAN 66X45 STRL (DRAPES) ×2 IMPLANT
DRAPE MICROSCOPE LEICA (MISCELLANEOUS) ×2 IMPLANT
DRAPE SURG 17X23 STRL (DRAPES) ×8 IMPLANT
DRSG MEPILEX BORDER 4X4 (GAUZE/BANDAGES/DRESSINGS) IMPLANT
DRSG MEPILEX BORDER 4X8 (GAUZE/BANDAGES/DRESSINGS) IMPLANT
DRSG OPSITE POSTOP 4X6 (GAUZE/BANDAGES/DRESSINGS) ×2 IMPLANT
DURAPREP 26ML APPLICATOR (WOUND CARE) ×2 IMPLANT
DURASEAL SPINE SEALANT 3ML (MISCELLANEOUS) ×2 IMPLANT
ELECT REM PT RETURN 9FT ADLT (ELECTROSURGICAL) ×2
ELECTRODE REM PT RTRN 9FT ADLT (ELECTROSURGICAL) ×1 IMPLANT
EVACUATOR 1/8 PVC DRAIN (DRAIN) IMPLANT
GLOVE BIOGEL PI IND STRL 8 (GLOVE) IMPLANT
GLOVE BIOGEL PI INDICATOR 8 (GLOVE)
GLOVE ECLIPSE 9.0 STRL (GLOVE) IMPLANT
GLOVE ORTHO TXT STRL SZ7.5 (GLOVE) IMPLANT
GLOVE SURG 8.5 LATEX PF (GLOVE) ×2 IMPLANT
GLOVE SURG SS PI 7.0 STRL IVOR (GLOVE) ×4 IMPLANT
GLOVE SURG SS PI 8.0 STRL IVOR (GLOVE) ×4 IMPLANT
GLOVE SURG SS PI 8.5 STRL IVOR (GLOVE) ×1
GLOVE SURG SS PI 8.5 STRL STRW (GLOVE) ×1 IMPLANT
GOWN STRL REUS W/ TWL LRG LVL3 (GOWN DISPOSABLE) ×3 IMPLANT
GOWN STRL REUS W/TWL 2XL LVL3 (GOWN DISPOSABLE) ×2 IMPLANT
GOWN STRL REUS W/TWL LRG LVL3 (GOWN DISPOSABLE) ×3
KIT BASIN OR (CUSTOM PROCEDURE TRAY) ×2 IMPLANT
KIT ROOM TURNOVER OR (KITS) ×2 IMPLANT
NEEDLE SPNL 18GX3.5 QUINCKE PK (NEEDLE) ×4 IMPLANT
NS IRRIG 1000ML POUR BTL (IV SOLUTION) ×2 IMPLANT
PACK LAMINECTOMY ORTHO (CUSTOM PROCEDURE TRAY) ×2 IMPLANT
PAD ARMBOARD 7.5X6 YLW CONV (MISCELLANEOUS) ×4 IMPLANT
PATTIES SURGICAL .5 X.5 (GAUZE/BANDAGES/DRESSINGS) ×2 IMPLANT
PATTIES SURGICAL .75X.75 (GAUZE/BANDAGES/DRESSINGS) ×2 IMPLANT
PATTIES SURGICAL 1X1 (DISPOSABLE) ×2 IMPLANT
RUBBERBAND STERILE (MISCELLANEOUS) ×4 IMPLANT
SPONGE LAP 4X18 X RAY DECT (DISPOSABLE) ×2 IMPLANT
SPONGE SURGIFOAM ABS GEL 100 (HEMOSTASIS) ×2 IMPLANT
SUT NURALON 4 0 TR CR/8 (SUTURE) ×2 IMPLANT
SUT VIC AB 0 CT1 27 (SUTURE)
SUT VIC AB 0 CT1 27XBRD ANBCTR (SUTURE) IMPLANT
SUT VIC AB 1 CT1 27 (SUTURE) ×1
SUT VIC AB 1 CT1 27XBRD ANBCTR (SUTURE) ×1 IMPLANT
SUT VIC AB 2-0 CT1 27 (SUTURE) ×1
SUT VIC AB 2-0 CT1 TAPERPNT 27 (SUTURE) ×1 IMPLANT
SUT VIC AB 3-0 X1 27 (SUTURE) IMPLANT
SUT VIC AB 4-0 PS2 27 (SUTURE) ×2 IMPLANT
SUT VICRYL 0 UR6 27IN ABS (SUTURE) ×2 IMPLANT
TOWEL OR 17X24 6PK STRL BLUE (TOWEL DISPOSABLE) ×2 IMPLANT
TOWEL OR 17X26 10 PK STRL BLUE (TOWEL DISPOSABLE) ×2 IMPLANT
TRAY FOLEY W/METER SILVER 16FR (SET/KITS/TRAYS/PACK) IMPLANT
WATER STERILE IRR 1000ML POUR (IV SOLUTION) ×2 IMPLANT

## 2017-04-23 NOTE — Anesthesia Procedure Notes (Signed)
Procedure Name: Intubation Date/Time: 04/23/2017 2:01 PM Performed by: Geraldo DockerSOLHEIM, Addysyn Fern SALOMON Pre-anesthesia Checklist: Patient identified, Patient being monitored, Timeout performed, Emergency Drugs available and Suction available Patient Re-evaluated:Patient Re-evaluated prior to inductionOxygen Delivery Method: Circle System Utilized Preoxygenation: Pre-oxygenation with 100% oxygen Intubation Type: IV induction Ventilation: Mask ventilation without difficulty Laryngoscope Size: Miller and 3 Grade View: Grade I Tube type: Oral Tube size: 8.0 mm Number of attempts: 1 Airway Equipment and Method: Stylet Placement Confirmation: ETT inserted through vocal cords under direct vision,  positive ETCO2 and breath sounds checked- equal and bilateral Secured at: 23 cm Tube secured with: Tape Dental Injury: Teeth and Oropharynx as per pre-operative assessment

## 2017-04-23 NOTE — Addendum Note (Signed)
Addendum  created 04/23/17 1816 by Dorris SinghGreen, Vaniya Augspurger, MD   Order list changed, Order sets accessed

## 2017-04-23 NOTE — Anesthesia Preprocedure Evaluation (Signed)
Anesthesia Evaluation  Patient identified by MRN, date of birth, ID band Patient awake    Reviewed: Allergy & Precautions, NPO status , Patient's Chart, lab work & pertinent test results  History of Anesthesia Complications (+) AWARENESS UNDER ANESTHESIA and history of anesthetic complications  Airway Mallampati: II  TM Distance: >3 FB Neck ROM: Full    Dental  (+) Teeth Intact, Dental Advisory Given   Pulmonary asthma , sleep apnea and Continuous Positive Airway Pressure Ventilation , Current Smoker,    breath sounds clear to auscultation       Cardiovascular hypertension, Pt. on medications  Rhythm:Regular Rate:Normal     Neuro/Psych  Headaches, PSYCHIATRIC DISORDERS Anxiety Depression    GI/Hepatic GERD  Medicated and Controlled,(+)     substance abuse  alcohol use,   Endo/Other    Renal/GU      Musculoskeletal  (+) Arthritis ,   Abdominal (+) - obese,  Abdomen: soft. Bowel sounds: normal.  Peds  Hematology  (+) Blood dyscrasia, anemia ,   Anesthesia Other Findings   Reproductive/Obstetrics                             Lab Results  Component Value Date   WBC 9.5 04/16/2017   HGB 11.3 (L) 04/16/2017   HCT 33.9 (L) 04/16/2017   MCV 81.9 04/16/2017   PLT 251 04/16/2017     Chemistry      Component Value Date/Time   NA 132 (L) 04/16/2017 1412   K 4.5 04/16/2017 1412   CL 104 04/16/2017 1412   CO2 20 (L) 04/16/2017 1412   BUN 12 04/16/2017 1412   CREATININE 0.89 04/16/2017 1412   CREATININE 1.73 (H) 10/20/2016 1549      Component Value Date/Time   CALCIUM 9.8 04/16/2017 1412   ALKPHOS 93 04/16/2017 1412   AST 38 04/16/2017 1412   ALT 20 04/16/2017 1412   BILITOT 0.9 04/16/2017 1412      Anesthesia Physical Anesthesia Plan  ASA: III  Anesthesia Plan: General   Post-op Pain Management:    Induction:   Airway Management Planned: Oral ETT  Additional  Equipment:   Intra-op Plan:   Post-operative Plan: Extubation in OR  Informed Consent: I have reviewed the patients History and Physical, chart, labs and discussed the procedure including the risks, benefits and alternatives for the proposed anesthesia with the patient or authorized representative who has indicated his/her understanding and acceptance.   Dental advisory given  Plan Discussed with: CRNA, Anesthesiologist and Surgeon  Anesthesia Plan Comments:         Anesthesia Quick Evaluation

## 2017-04-23 NOTE — Brief Op Note (Signed)
04/23/2017  5:11 PM  PATIENT:  Michael [name]  51 y.o. male  PRE-OPERATIVE DIAGNOSIS:  L4-5 lumbar herniated disc  POST-OPERATIVE DIAGNOSIS:  Lumbar four-five lumbar herniated disc  PROCEDURE:  Procedure(s): BILATERAL LUMBAR DECOMPRESSION FOUR-FIVE WITH POSSIBLE DISCECTOMY (N/A)  SURGEON:  Surgeon(s) and Role:    * Kerrin ChampagneNitka, James E, MD - Primary  ASSISTANTS: [name] CRNFA   ANESTHESIA:   local and general, Jennet MaduroJoshua Solheim CRNA.  COMPLICATION: Dural tear central left posterior without neural element injury, repaired with 4-0 Neurolon and Duraseal.  EBL:  Total I/O In: 2100 [I.V.:1600; IV Piggyback:500] Out: 525 [Blood:525]  BLOOD ADMINISTERED:none  DRAINS: none   LOCAL MEDICATIONS USED:  MARCAINE 0.5%  1:1 EXPAREL 1.3%   and Amount: 20 ml  SPECIMEN:  No Specimen  DISPOSITION OF SPECIMEN:  N/A  COUNTS:  YES  TOURNIQUET:  * No tourniquets in log *  DICTATION: .Dragon Dictation  PLAN OF CARE: Admit to inpatient   PATIENT DISPOSITION:  PACU - hemodynamically stable.   Delay start of Pharmacological VTE agent (>24hrs) due to surgical blood loss or risk of bleeding: yes

## 2017-04-23 NOTE — Addendum Note (Signed)
Addendum  created 04/23/17 1817 by Daiva Evesavenel, Virginia Curl W, CRNA   Anesthesia Intra Meds edited

## 2017-04-23 NOTE — Anesthesia Postprocedure Evaluation (Signed)
Anesthesia Post Note  Patient: Michael [name]  Procedure(s) Performed: Procedure(s) (LRB): BILATERAL LUMBAR DECOMPRESSION FOUR-FIVE WITH POSSIBLE DISCECTOMY (N/A)  Patient location during evaluation: PACU Anesthesia Type: General Level of consciousness: awake Pain management: pain level controlled Respiratory status: spontaneous breathing Cardiovascular status: stable Anesthetic complications: no       Last Vitals:  Vitals:   04/23/17 1730 04/23/17 1745  BP: 98/61 101/61  Pulse: 68 68  Resp: 15 15  Temp:      Last Pain:  Vitals:   04/23/17 1730  TempSrc:   PainSc: 0-No pain                 Jedediah Noda

## 2017-04-23 NOTE — Transfer of Care (Signed)
Immediate Anesthesia Transfer of Care Note  Patient: Michael [name]  Procedure(s) Performed: Procedure(s): BILATERAL LUMBAR DECOMPRESSION FOUR-FIVE WITH POSSIBLE DISCECTOMY (N/A)  Patient Location: PACU  Anesthesia Type:General  Level of Consciousness: drowsy  Airway & Oxygen Therapy: Patient Spontanous Breathing and Patient connected to face mask oxygen  Post-op Assessment: Report given to RN and Post -op Vital signs reviewed and stable  Post vital signs: Reviewed and stable  Last Vitals:  Vitals:   04/23/17 1132  BP: 104/63  Pulse: 72  Resp: 18  Temp: 36.8 C    Last Pain:  Vitals:   04/23/17 1238  TempSrc:   PainSc: 2          Complications: No apparent anesthesia complications

## 2017-04-23 NOTE — Op Note (Addendum)
04/23/2017  5:29 PM  PATIENT:  Michael [name]  51 y.o. male  MRN: [id number]  OPERATIVE REPORT  PRE-OPERATIVE DIAGNOSIS:  L4-5 lumbar herniated disc  POST-OPERATIVE DIAGNOSIS:  Lumbar four-five lumbar herniated disc  PROCEDURE:  Procedure(s): BILATERAL LUMBAR DECOMPRESSION FOUR-FIVE WITH POSSIBLE DISCECTOMY    SURGEON:  Jessy Oto, MD     ASSISTANT:  [name], CRNFA  (Present throughout the entire procedure and necessary for completion of procedure in a timely manner)     ANESTHESIA:  General,supplemented with local marcaine 0.5% 1:1 exparel 1.3% total 20 cc, [name]    COMPLICATIONS:  CSF Leak due to dural tear no neural element, midline to the right L4-5 at the superior margin of L5 lamina posterior and central.     EBL:125 cc  DRAINS:None  PROCEDURE: The patient was met in the holding area, and the appropriate L4-5 levels identified not marked with an "X" and my initials since this was a bilateral procedure. The patient was then transported to OR and was placed under general anesthesia without difficulty. Then placed on the operative table in a prone position. The Wilson frame and sliding OR table was used. The patient received appropriate preoperative antibiotic prophylaxis Vancomycin 1 gram for PCN allergy. Standard prep with DuraPrep solution from the mid dorsal spine to the mid sacral level.Time-out procedure was called and correct .  Patient was draped in the usual manner. Iodine Vi-Drape was used. 2 x 18-gauge spinal needles were placed at the expected and of the expected levels. Intraoperative lateral xray demonstrated the needles placed posterior to the  L4 and L5 levels. Initial incisions were made at palpable process representing L4and at the lowest aspect of the incision at the expected level of L5 approximately 2 1/2inches in length. The incision were infiltrated with marcaine 1/2% plain:exparel 1.3% 1:1. Total of 20 cc were used. Skin subcutaneous layers  divide down to the lumbodorsal fascia this was incised in bothL4-5 spinous processes, Penfield #4 place in the right L4-5 facet and cross table lateral radiograph of the lumbar spine showed the instrument at the L4-5 facet. Boss McCollough retractor was inserted at the incision site. Leksell rongeur used to remove a portion of the inferior aspect of the spinous process of L4  40% and superior 30% of L5. Leksell rongeur was then used to further remove bone down to the ligamentum flavum and the central portions of the lamina and base of the spinous process were thinned. 4 mm burr was also used to thin the lamina of L4 centrally and the inferior 50% of the lamina of L4. The facets were then exposed out laterally and were hypertrophic. Ella Jubilee were then used to resect central portions of the lamina of L4 and the inferior 50% of L4 lamina. Ligamentum flavum at theL4-5 levels was then carefully resected centrally preserving at least 7-8 mm with at the pars level L4 and L5. The central laminectomy was further widened performing more resection of the medial aspect of facet at both sides L4-5. Note that Sterilely draped OR microscopewas used for this portion procedure.the central portions of the ligamentum flavum at the L3-4 level were resected and the medial aspect of the L3-4 facet resected over about 5-10%. Hypertrophic flava was found to be present. The operating room microscope sterilely draped brought into the field and then carefully the right side decompressed at each level beginning at the right L5 neuroforamen resect bone over the superior lamina of L5 and decompressing the right L5 foramen.  A CSF leak encountered with resection of the ligamentum flavum off the ventral aspect of the right and central superior lamina of L5. Cottonoids used to temporarily decrease CSF leakage and to protect the neural elements.  Hockey-stick nerve probe could be passed out both the L4 and L5 neuroforamen.Changing sides of the OR  table in decompression was carried out on the left side L4 and L5 nerve root at the L4-5 level.  The left medial aspect of facet at the L4-5 level was then carefully evaluated and hypertrophic ligamentum flavum resected using Kerrisons decompressing the lateral recess on this left side. This was done such that hockey-stick nerve probe could be used to pass the outer recess demonstrating patency and decompression of the left L4 nerve root and L5 nerve roots. Attention then turned to the right L4-5 were further debridement of the lateral recess and hypertrophic ligamentum flavum and reflected portions of ligamentum flavum were carried out.Following this then a hockey-stick nerve probe could be passed out easily bilateral L4 and L5 neuroforamen. Returning to the right side with the OR microscope the edges of the dural tear were identified, the tear was about 82m in length. The tear was repaired placing a cranial stitch of 4-0 neurolon at the superior margin of the tear and using this for traction on the dura. The dura mater then repaired with 4 additional 4-0 neurolon sutures in simple fashion. The suture repair tested water tight with valsalva to 40 mm HG, following decompression bilaterally thrombin-soaked Gelfoam was applied to the laminotomy defects and these areas packed with small sponges. Adequate hemostasis was obtained at all levels there were significant epidural veins that were cauterized with bipolar electrocautery The thrombin-soaked Gelfoam was then removed and a layer of duraseal then applied to the thecal sac over the area of the dural repair.The incision showed no active bleeding present.  Boss McCollough retractor was then removed. At the end of the case, an approximately 15 mm central laminotomy was present with normal pulsation of the thecal sac present. Following additional irrigation and with no active bleeding present at any level level, the incision was then closed by first approximating the  lumbar muscles the midline with interrupted #1 Vicryl sutures loosely the lumbodorsal fascia then approximated with interrupted #1 Vicryl sutures.  Additional MARCAINE 0.5% 1:1 EXPAREL 1.3%, Amount: 10 ml infiltrated the fascia.  Deep subcutaneous layers approximated with interrupted #0 Vicryl suture more superficial layers with interrupted 2-0 Vicryl suture the skin closed with a running subcutaneous stitch of 4-0 Vicryl. Dermabond was applied to the skin margins.  A honeycomb bandage was applied to the midline incision site. All instrument and sponge counts were correct. Patient was then reactivated returned to supine position and extubated. He was then returned to recovery room in satisfactory condition.   [name], CRNFA, performed the duties of assistant surgeon throughout this case she assisted using loupe magnification and OR microscope retracting delicate neural structures suctioning over the neural structures throughout the case bilaterally . She was present from the beginning of the case to the end of the case. Assisted in positioning the patient and  the arm and legs. She also participated in removal the patient from the operating table.           JJessy Oto 04/23/2017, 5:29 PM
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
## 2017-04-23 NOTE — Interval H&P Note (Signed)
History and Physical Interval Note:  04/23/2017 1:12 PM  Michael [name]  has presented today for surgery, with the diagnosis of L4-5 lumbar herniated disc  The various methods of treatment have been discussed with the patient and family. After consideration of risks, benefits and other options for treatment, the patient has consented to  Procedure(s): BILATERAL LUMBAR DECOMPRESSION L4-5 WITH POSSIBLE DISCECTOMY (N/A) as a surgical intervention .  The patient's history has been reviewed, patient examined, no change in status, stable for surgery.  I have reviewed the patient's chart and labs.  Questions were answered to the patient's satisfaction.     Kerrin ChampagneJames E Nitka

## 2017-04-24 ENCOUNTER — Encounter (HOSPITAL_COMMUNITY): Payer: Self-pay | Admitting: Specialist

## 2017-04-24 DIAGNOSIS — D62 Acute posthemorrhagic anemia: Secondary | ICD-10-CM | POA: Diagnosis not present

## 2017-04-24 DIAGNOSIS — G9782 Other postprocedural complications and disorders of nervous system: Secondary | ICD-10-CM | POA: Diagnosis not present

## 2017-04-24 DIAGNOSIS — G9600 Cerebrospinal fluid leak, unspecified: Secondary | ICD-10-CM | POA: Diagnosis not present

## 2017-04-24 LAB — CBC
HEMATOCRIT: 25.7 % — AB (ref 39.0–52.0)
HEMOGLOBIN: 8.7 g/dL — AB (ref 13.0–17.0)
MCH: 27.7 pg (ref 26.0–34.0)
MCHC: 33.9 g/dL (ref 30.0–36.0)
MCV: 81.8 fL (ref 78.0–100.0)
Platelets: 129 10*3/uL — ABNORMAL LOW (ref 150–400)
RBC: 3.14 MIL/uL — AB (ref 4.22–5.81)
RDW: 17.7 % — ABNORMAL HIGH (ref 11.5–15.5)
WBC: 6.7 10*3/uL (ref 4.0–10.5)

## 2017-04-24 LAB — BASIC METABOLIC PANEL
Anion gap: 6 (ref 5–15)
BUN: 15 mg/dL (ref 6–20)
CHLORIDE: 106 mmol/L (ref 101–111)
CO2: 20 mmol/L — AB (ref 22–32)
CREATININE: 1 mg/dL (ref 0.61–1.24)
Calcium: 8.9 mg/dL (ref 8.9–10.3)
GFR calc Af Amer: >60 mL/min (ref >60)
GFR calc non Af Amer: >60 mL/min (ref >60)
GLUCOSE: 151 mg/dL — AB (ref 65–99)
POTASSIUM: 4.7 mmol/L (ref 3.5–5.1)
SODIUM: 132 mmol/L — AB (ref 135–145)

## 2017-04-24 MED ORDER — OXYCODONE HCL 5 MG PO TABS: 5.0000 mg | ORAL_TABLET | ORAL | 0 refills | Status: DC | PRN

## 2017-04-24 MED ORDER — METHOCARBAMOL 500 MG PO TABS: 500.0000 mg | ORAL_TABLET | Freq: Four times a day (QID) | ORAL | 1 refills | Status: DC | PRN

## 2017-04-24 MED ORDER — ZOLPIDEM TARTRATE 5 MG PO TABS: 15.0000 mg | ORAL_TABLET | Freq: Every day | ORAL | 0 refills | Status: DC

## 2017-04-24 MED ORDER — NICOTINE 21 MG/24HR TD PT24
21.0000 mg | MEDICATED_PATCH | Freq: Every day | TRANSDERMAL | Status: DC
  Administered 2017-04-24: 21 mg via TRANSDERMAL
  Filled 2017-04-24 (×2): qty 1

## 2017-04-24 MED ORDER — DOXYCYCLINE HYCLATE 100 MG PO TABS: 100.0000 mg | ORAL_TABLET | Freq: Two times a day (BID) | ORAL | 0 refills | Status: DC

## 2017-04-24 MED ORDER — FERROUS GLUCONATE 324 (38 FE) MG PO TABS: 324.0000 mg | ORAL_TABLET | Freq: Two times a day (BID) | ORAL | 3 refills | Status: DC

## 2017-04-24 MED ORDER — OXYCODONE HCL ER 10 MG PO T12A: 10.0000 mg | EXTENDED_RELEASE_TABLET | Freq: Two times a day (BID) | ORAL | 0 refills | Status: DC

## 2017-04-24 NOTE — Discharge Instructions (Addendum)
° ° °  No lifting greater than 10 lbs. Avoid bending, stooping and twisting. Walk in house for first 2 weeks then may start to get out slowly increasing distance up to one mile by 8 weeks post op. Keep incision dry for 3 days, may use tegaderm or similar water impervious dressing.

## 2017-04-24 NOTE — Evaluation (Signed)
Occupational Therapy Evaluation and Discharge Patient Details Name: Michael [name] MRN: [id number] DOB: 1966-05-02 Today's Date: 04/24/2017    History of Present Illness Pt is 51 yo male s/p L4-5 lumbar decompression and discectomy. Prior to surgery pt had L4-5 hernaited disc and decreased LE sensation and weakness.    Clinical Impression   PTA Pt independent in ADL and mobility. Pt currently min A for ADL and min guard for mobility. Back handout provided and reviewed adls in detail. Pt educated on: set an alarm at night for medication, avoid sitting for long periods of time, correct bed positioning for sleeping, correct sequence for bed mobility, avoiding lifting more than 5 pounds and never wash directly over incision. All education is complete and patient indicates understanding. OT to sign off, thank you for this referral.     Follow Up Recommendations  No OT follow up;Supervision - Intermittent    Equipment Recommendations  None recommended by OT;Other (comment) (Pt has appropriate DME he can get from mom)    Recommendations for Other Services       Precautions / Restrictions Precautions Precautions: Back;Fall Precaution Booklet Issued: Yes (comment) Precaution Comments: Back [place] reviewed in full Restrictions Weight Bearing Restrictions: No      Mobility Bed Mobility Overal bed mobility: Needs Assistance Bed Mobility: Sidelying to Sit;Sit to Sidelying   Sidelying to sit: Supervision     Sit to sidelying: Supervision General bed mobility comments: VCs for log rolling to maintain spinal precautions.  Transfers Overall transfer level: Needs assistance Equipment used: Rolling walker (2 wheeled) Transfers: Sit to/from Stand Sit to Stand: Min guard         General transfer comment: min guard for safety    Balance Overall balance assessment: Needs assistance Sitting-balance support: No upper extremity supported;Feet supported Sitting balance-Leahy Scale: Good      Standing balance support: No upper extremity supported;During functional activity Standing balance-Leahy Scale: Fair Standing balance comment: Pt able to perform sink level grooming with no UE support                           ADL either performed or assessed with clinical judgement   ADL Overall ADL's : Needs assistance/impaired Eating/Feeding: Modified independent;Sitting   Grooming: Wash/dry hands;Wash/dry face;Min guard;Standing Grooming Details (indicate cue type and reason): vc for precautions, cup method and other compensatory strategies education complete. Upper Body Bathing: Min guard;Sitting;With adaptive equipment Upper Body Bathing Details (indicate cue type and reason): educated on longhandle sponge Lower Body Bathing: Supervison/ safety;Sitting/lateral leans Lower Body Bathing Details (indicate cue type and reason): able to cross legs and bring feet to opposite knees Upper Body Dressing : Supervision/safety;Sitting Upper Body Dressing Details (indicate cue type and reason): able to don shirt Lower Body Dressing: Supervision/safety;Sit to/from stand Lower Body Dressing Details (indicate cue type and reason): able to don shorts Toilet Transfer: Min guard;Ambulation Toilet Transfer Details (indicate cue type and reason): able to ambulate to bathroom twice for standing urination Toileting- Clothing Manipulation and Hygiene: Min guard (standing) Toileting - Clothing Manipulation Details (indicate cue type and reason): managed shorts for urination Tub/ Shower Transfer: Min guard;With caregiver independent assisting   Functional mobility during ADLs: Min guard;Rolling walker (started with RW, second time to bathroom, no DME) General ADL Comments: Pt anxious about going home, stated he felt better about going home after session     Vision Baseline Vision/History: Wears glasses Wears Glasses: Reading only Patient Visual Report: No change from  baseline Vision  Assessment?: No apparent visual deficits     Perception     Praxis      Pertinent Vitals/Pain Pain Assessment: 0-10 Pain Score: 5  Pain Location: Low back  Pain Descriptors / Indicators: Aching;Discomfort;Sore Pain Intervention(s): Monitored during session;Repositioned     Hand Dominance Right   Extremity/Trunk Assessment Upper Extremity Assessment Upper Extremity Assessment: Generalized weakness   Lower Extremity Assessment Lower Extremity Assessment: Defer to PT evaluation   Cervical / Trunk Assessment Cervical / Trunk Assessment: Other exceptions Cervical / Trunk Exceptions: s/p lumbar surgery   Communication Communication Communication: No difficulties   Cognition Arousal/Alertness: Awake/alert Behavior During Therapy: WFL for tasks assessed/performed Overall Cognitive Status: Within Functional Limits for tasks assessed                                     General Comments       Exercises     Shoulder Instructions      Home Living Family/patient expects to be discharged to:: Private residence Living Arrangements: Spouse/significant other Available Help at Discharge: Family Type of Home: House Home Access: Stairs to enter Secretary/administrator of Steps: 2 Entrance Stairs-Rails: Can reach both Home Layout: One level     Bathroom Shower/Tub: Tub/shower unit;Walk-in shower   Bathroom Toilet: Standard     Home Equipment: None   Additional Comments: pt states his mother has a walker and 3 in 1      Prior Functioning/Environment Level of Independence: Independent                 OT Problem List: Decreased range of motion;Decreased activity tolerance;Decreased safety awareness;Decreased knowledge of use of DME or AE;Decreased knowledge of precautions;Pain      OT Treatment/Interventions:      OT Goals(Current goals can be found in the care plan section) Acute Rehab OT Goals Patient Stated Goal: to go home OT Goal Formulation:  With patient Time For Goal Achievement: 05/08/17 Potential to Achieve Goals: Good  OT Frequency:     Barriers to D/C:            Co-evaluation              AM-PAC PT "6 Clicks" Daily Activity     Outcome Measure Help from another person eating meals?: None Help from another person taking care of personal grooming?: None Help from another person toileting, which includes using toliet, bedpan, or urinal?: A Little Help from another person bathing (including washing, rinsing, drying)?: A Little Help from another person to put on and taking off regular upper body clothing?: None Help from another person to put on and taking off regular lower body clothing?: None 6 Click Score: 22   End of Session Equipment Utilized During Treatment: Gait belt;Rolling walker Nurse Communication: Mobility status;Other (comment) (aware no chair alarm)  Activity Tolerance: Patient tolerated treatment well Patient left: in chair;with call bell/phone within reach;with family/visitor present  OT Visit Diagnosis: Unsteadiness on feet (R26.81);Pain;Muscle weakness (generalized) (M62.81) Pain - Right/Left: Right Pain - part of body: Leg (back)                Time: 1610-9604 OT Time Calculation (min): 60 min Charges:  OT General Charges $OT Visit: 1 Procedure OT Evaluation $OT Eval Moderate Complexity: 1 Procedure OT Treatments $Self Care/Home Management : 23-37 mins $Therapeutic Activity: 8-22 mins G-Codes: OT G-codes **NOT FOR INPATIENT CLASS** Functional Assessment  Tool Used: AM-PAC 6 Clicks Daily Activity Functional Limitation: Self care Self Care Current Status (Z6109(G8987): At least 20 percent but less than 40 percent impaired, limited or restricted Self Care Goal Status (U0454(G8988): At least 1 percent but less than 20 percent impaired, limited or restricted Self Care Discharge Status [phone]): At least 20 percent but less than 40 percent impaired, limited or restricted   Sherryl MangesLaura Kaylee Wombles  OTR/L [phone]  Evern BioLaura J Ingri Diemer 04/24/2017, 4:50 PM

## 2017-04-24 NOTE — Progress Notes (Signed)
     Subjective: 1 Day Post-Op Procedure(s) (LRB): BILATERAL LUMBAR DECOMPRESSION FOUR-FIVE WITH POSSIBLE DISCECTOMY (N/A) Awake, alert and oriented x 4. Did well with PT/OT and is ready for discharge. In bad need of a cigarette.Tolerating po nourishment and meds. Oxycontin 10 mg and oxyIR 5 mg 1-2 every 3-4  For breakthrough is hold the pain. Desires a muscle relaxer. No HAs, no nausea, no photophobia.  PT notes that he should be sent home with a walker as he is walking and more steady with this.  Patient reports pain as moderate.    Objective:   VITALS:  Temp:  [97.5 F (36.4 C)-98.1 F (36.7 C)] 97.8 F (36.6 C) (05/30 1000) Pulse Rate:  [68-85] 76 (05/30 1000) Resp:  [12-18] 17 (05/30 1000) BP: (96-113)/(47-66) 106/62 (05/30 1000) SpO2:  [99 %-100 %] 100 % (05/30 1000)  ABD soft Sensation intact distally Intact pulses distally Dorsiflexion/Plantar flexion intact Incision: no drainage No cellulitis present Dressing with same less than cm2 area of dried blood. Notes decreased leg pain with standing and walking.    LABS  Recent Labs  04/24/17 0308  HGB 8.7*  WBC 6.7  PLT 129*    Recent Labs  04/24/17 0308  NA 132*  K 4.7  CL 106  CO2 20*  BUN 15  CREATININE 1.00  GLUCOSE 151*    Recent Labs  04/23/17 1122  INR 1.40     Assessment/Plan: 1 Day Post-Op Procedure(s) (LRB): BILATERAL LUMBAR DECOMPRESSION FOUR-FIVE WITH POSSIBLE DISCECTOMY (N/A)  Discharge home with walker.   Michael [name] 04/24/2017, 4:42 PM Patient ID: Michael [name], male   DOB: 05/26/1966, 51 y.o.   MRN: [id number]

## 2017-04-24 NOTE — Progress Notes (Signed)
     Subjective: 1 Day Post-Op Procedure(s) (LRB): BILATERAL LUMBAR DECOMPRESSION FOUR-FIVE WITH POSSIBLE DISCECTOMY (N/A)Awake, alert and oriented x 4. I would like to have a cigarette. My breakfast fell on the floor. No numbness or paresthesias, pain in my back.No HAs,  No nausea, no photophobia, no nuchal signs.  Patient reports pain as moderate.    Objective:   VITALS:  Temp:  [97.5 F (36.4 C)-98.2 F (36.8 C)] 97.5 F (36.4 C) (05/30 0505) Pulse Rate:  [68-85] 84 (05/30 0505) Resp:  [12-18] 18 (05/30 0505) BP: (96-113)/(47-66) 100/57 (05/30 0505) SpO2:  [99 %-100 %] 100 % (05/30 0505) Weight:  [182 lb (82.6 kg)] 182 lb (82.6 kg) (05/29 1132)  ABD soft Sensation intact distally Intact pulses distally Dorsiflexion/Plantar flexion intact Incision: scant drainage and dressing intact No fluctuance. Weakness right foot DF 4/5  LABS  Recent Labs  04/24/17 0308  HGB 8.7*  WBC 6.7  PLT 129*    Recent Labs  04/24/17 0308  NA 132*  K 4.7  CL 106  CO2 20*  BUN 15  CREATININE 1.00  GLUCOSE 151*    Recent Labs  04/23/17 1122  INR 1.40     Assessment/Plan: 1 Day Post-Op Procedure(s) (LRB): BILATERAL LUMBAR DECOMPRESSION FOUR-FIVE WITH POSSIBLE DISCECTOMY (N/A)  CSF leak post repair, good closure.  Advance diet Up with therapy D/C IV fluids  Start PT/OT today and recheck this PM if he is stable I will discharge at that time.  Kerrin ChampagneJames E Nitka 04/24/2017, 8:35 AM Patient ID: Michael Rhymesonald Civello, male   DOB: 09/22/1966, 51 y.o.   MRN: [id number]

## 2017-04-24 NOTE — Evaluation (Signed)
Physical Therapy Evaluation Patient Details Name: Michael [name] MRN: [id number] DOB: 10/13/1966 Today's Date: 04/24/2017   History of Present Illness  Pt is 51 yo male s/p L4-5 lumbar decompression and discectomy. Prior to surgery pt had L4-5 hernaited disc and decreased LE sensation and weakness.   Clinical Impression  Pt presents with decreased mobility due to BLE weakness and instability. Pt presented with loss of balance upon standing with reports of "swimmy headedness." BP revealed 123/75 in sitting and 112/70 in standing. Symptoms resolved with static standing and pt able to ambulate with RW for improved support. Progressed to gait with single UE with pt demonstrated unsteadiness. Pt would benefit from continued mobilization during acute stay to ensure safety with d/c home.     Follow Up Recommendations No PT follow up    Equipment Recommendations  None recommended by PT    Recommendations for Other Services       Precautions / Restrictions Precautions Precautions: Back;Fall Precaution Booklet Issued: Yes (comment) Precaution Comments: spinal precautions handout  Restrictions Weight Bearing Restrictions: No      Mobility  Bed Mobility Overal bed mobility: Needs Assistance Bed Mobility: Sidelying to Sit;Sit to Sidelying   Sidelying to sit: Supervision     Sit to sidelying: Supervision General bed mobility comments: VCs for log rolling to maintain spinal precautions.  Transfers Overall transfer level: Needs assistance Equipment used: Rolling walker (2 wheeled) Transfers: Sit to/from Stand Sit to Stand: Min assist         General transfer comment: Pt had LOB with initial standing requiring min A and HHA to maintain. Pt reported feeling "swimmy headed". BP revealed 123/75 in sitting and 112/70 upon standing.   Ambulation/Gait Ambulation/Gait assistance: Min assist Ambulation Distance (Feet): 250 Feet Assistive device: Rolling walker (2 wheeled) Gait  Pattern/deviations: Step-through pattern;Decreased stride length;Trunk flexed Gait velocity: decreased Gait velocity interpretation: Below normal speed for age/gender General Gait Details: Pt ambulated 22720ft with RW for stability. PT provided VCs to decrease WB through BUE and increase cadence. Pt ambulated additional 330ft with IV pole and min A and presented with increased instability but no symptoms.   Stairs            Wheelchair Mobility    Modified Rankin (Stroke Patients Only)       Balance Overall balance assessment: Needs assistance Sitting-balance support: No upper extremity supported;Feet supported Sitting balance-Leahy Scale: Good     Standing balance support: No upper extremity supported;During functional activity Standing balance-Leahy Scale: Poor Standing balance comment: Pt unsteady upon standing required min A to and UE support to maintain balance. Pts stability improved with mobility, but cont to demonstrate balance deficits.                              Pertinent Vitals/Pain Pain Assessment: 0-10 Pain Score: 7  Pain Location: Low back  Pain Descriptors / Indicators: Aching;Discomfort;Sore Pain Intervention(s): Monitored during session    Home Living Family/patient expects to be discharged to:: Private residence Living Arrangements: Spouse/significant other Available Help at Discharge: Family Type of Home: House Home Access: Stairs to enter Entrance Stairs-Rails: Can reach both Entrance Stairs-Number of Steps: 2 Home Layout: One level Home Equipment: None Additional Comments: pt states his mother has a walker     Prior Function Level of Independence: Independent               Hand Dominance   Dominant Hand: Right  Extremity/Trunk Assessment   Upper Extremity Assessment Upper Extremity Assessment: Defer to OT evaluation    Lower Extremity Assessment Lower Extremity Assessment: Generalized weakness;RLE  deficits/detail;LLE deficits/detail RLE Deficits / Details: RLE weakness grossly 4/5. R dorsiflexion weakness noted during ambulation LLE Deficits / Details: WFL for LLE        Communication   Communication: No difficulties  Cognition Arousal/Alertness: Awake/alert Behavior During Therapy: WFL for tasks assessed/performed Overall Cognitive Status: Within Functional Limits for tasks assessed                                        General Comments General comments (skin integrity, edema, etc.): PT provided education for spinal precautions, activity expectations, safety with mobilization, and safety with car transfers    Exercises     Assessment/Plan    PT Assessment Patient needs continued PT services  PT Problem List Decreased strength;Decreased activity tolerance;Decreased balance;Decreased mobility;Decreased coordination;Decreased knowledge of use of DME;Decreased safety awareness;Decreased knowledge of precautions       PT Treatment Interventions DME instruction;Gait training;Stair training;Functional mobility training;Therapeutic activities;Therapeutic exercise;Balance training;Patient/family education    PT Goals (Current goals can be found in the Care Plan section)  Acute Rehab PT Goals Patient Stated Goal: to go home PT Goal Formulation: With patient Time For Goal Achievement: 05/08/17 Potential to Achieve Goals: Good    Frequency Min 5X/week   Barriers to discharge        Co-evaluation               AM-PAC PT "6 Clicks" Daily Activity  Outcome Measure Difficulty turning over in bed (including adjusting bedclothes, sheets and blankets)?: A Little Difficulty moving from lying on back to sitting on the side of the bed? : A Little Difficulty sitting down on and standing up from a chair with arms (e.g., wheelchair, bedside commode, etc,.)?: A Lot Help needed moving to and from a bed to chair (including a wheelchair)?: A Lot Help needed walking  in hospital room?: A Lot Help needed climbing 3-5 steps with a railing? : A Lot 6 Click Score: 14    End of Session Equipment Utilized During Treatment: Gait belt Activity Tolerance: Patient limited by pain Patient left: in chair;with call bell/phone within reach;with chair alarm set Nurse Communication: Mobility status PT Visit Diagnosis: Unsteadiness on feet (R26.81);Difficulty in walking, not elsewhere classified (R26.2)    Time: 1610-9604 PT Time Calculation (min) (ACUTE ONLY): 27 min   Charges:   PT Evaluation $PT Eval Moderate Complexity: 1 Procedure PT Treatments $Gait Training: 8-22 mins   PT G Codes:   PT G-Codes **NOT FOR INPATIENT CLASS** Functional Assessment Tool Used: Clinical judgement Functional Limitation: Mobility: Walking and moving around Mobility: Walking and Moving Around Current Status (V4098): At least 40 percent but less than 60 percent impaired, limited or restricted Mobility: Walking and Moving Around Goal Status [phone]): At least 1 percent but less than 20 percent impaired, limited or restricted    Corlis Leak, SPT  [phone]  Corlis Leak 04/24/2017, 10:12 AM

## 2017-04-24 NOTE — Care Management Note (Signed)
Case Management Note  Patient Details  Name: Michael [name] MRN: [id number] Date of Birth: 03/27/1966  Subjective/Objective:                    Action/Plan: Pt discharging home with self care. No f/u and no DME needs per PT. Pt has insurance and PCP. No further needs per CM.   Expected Discharge Date:  04/23/17               Expected Discharge Plan:  Home/Self Care  In-House Referral:     Discharge planning Services     Post Acute Care Choice:    Choice offered to:     DME Arranged:    DME Agency:     HH Arranged:    HH Agency:     Status of Service:  Completed, signed off  If discussed at [employer] Length of Stay Meetings, dates discussed:    Additional Comments:  Kermit BaloKelli F Micheline Markes, RN 04/24/2017, 4:39 PM

## 2017-04-24 NOTE — Discharge Summary (Addendum)
Physician Discharge Summary      Patient ID: Michael [name] MRN: [id number] DOB/AGE: 1966/03/23 51 y.o.  Admit date: 04/23/2017 Discharge date: 04/24/2017  Admission Diagnoses:  Active Problems:   CSF leak   Anemia associated with acute blood loss   Spinal stenosis of lumbar region with neurogenic claudication   Discharge Diagnoses:  Same  Past Medical History:  Diagnosis Date  . Allergy   . Anemia   . Anxiety   . Arthritis   . Asthma   . Complication of anesthesia    woke up during surgery for the past 3 surgeries  . Depression   . Gastritis   . GERD (gastroesophageal reflux disease)   . Headache    hx. migraines  none in a long time  . Hypertension   . Insomnia   . MRSA (methicillin resistant Staphylococcus aureus) infection    left hand  . Skin abscess    Recurrent  . Sleep apnea    uses CPAP  on and off    Surgeries: Procedure(s): BILATERAL LUMBAR DECOMPRESSION FOUR-FIVE WITH POSSIBLE DISCECTOMY on 04/23/2017   Consultants:   Discharged Condition: Improved  Hospital Course: Imanuel Pruiett is an 51 y.o. male who was admitted 04/23/2017 with a chief complaint of No chief complaint on file. , and found to have a diagnosis of <principal problem not specified>.  He was brought to the operating room on 04/23/2017 and underwent the above named procedures.     He was given perioperative antibiotics:  Anti-infectives    Start     Dose/Rate Route Frequency Ordered Stop   04/24/17 0000  doxycycline (VIBRA-TABS) 100 MG tablet     100 mg Oral 2 times daily 04/24/17 1653     04/23/17 2300  vancomycin (VANCOCIN) IVPB 1000 mg/200 mL premix     1,000 mg 200 mL/hr over 60 Minutes Intravenous  Once 04/23/17 1906 04/24/17 0100   04/23/17 2200  doxycycline (VIBRA-TABS) tablet 100 mg     100 mg Oral 2 times daily 04/23/17 1800     04/23/17 1121  vancomycin (VANCOCIN) IVPB 1000 mg/200 mL premix     1,000 mg 200 mL/hr over 60 Minutes Intravenous On call to O.R. 04/23/17 1121  04/23/17 1420    Post operative his HOB maintained at 15 degrees elevation maximum for 14 hours. Did well in recovery showing normal VSS. He did have weakness in the right foot DF post op likely due to nerve retraction as the dural tear was in the area of the mid canal and both the L5 and L4 nerve roots not at risk. Dressing remained dry with a minimal scant drainage in the AM At rounds. POD#1 restrictions of bedrest discontinued, no nuchal symptoms or signs. Dressing stable. Did well with PT and OT and was felt to be ready for discharge. A nicotine patch started but he was not able to quell need for smoking and preferred to go home. His exam unchanged in the afternoon of POD#1. VSS. Dressing unchanged. Weakness right foot DF 5- to 4+ / 5. Discussed precautions. Prescriptions for narcotics, oxycontin # 28 and OxyIR 5 #50 given to patient. Also methocarbamol, and ferrous gluconate.    He was given sequential compression devices, early ambulation, and chemoprophylaxis for DVT prophylaxis.   He benefited maximally from their hospital stay and there were no complications.    Recent vital signs:  Vitals:   04/24/17 0505 04/24/17 1000  BP: (!) 100/57 106/62  Pulse: 84 76  Resp: 18 17  Temp: 97.5 F (36.4 C) 97.8 F (36.6 C)    Recent laboratory studies:  Results for orders placed or performed during the hospital encounter of 04/23/17  Protime-INR  Result Value Ref Range   Prothrombin Time 17.3 (H) 11.4 - 15.2 seconds   INR 1.40   APTT  Result Value Ref Range   aPTT 41 (H) 24 - 36 seconds  CBC  Result Value Ref Range   WBC 6.7 4.0 - 10.5 K/uL   RBC 3.14 (L) 4.22 - 5.81 MIL/uL   Hemoglobin 8.7 (L) 13.0 - 17.0 g/dL   HCT 40.9 (L) 81.1 - 91.4 %   MCV 81.8 78.0 - 100.0 fL   MCH 27.7 26.0 - 34.0 pg   MCHC 33.9 30.0 - 36.0 g/dL   RDW 78.2 (H) 95.6 - 21.3 %   Platelets 129 (L) 150 - 400 K/uL  Basic Metabolic Panel  Result Value Ref Range   Sodium 132 (L) 135 - 145 mmol/L   Potassium 4.7  3.5 - 5.1 mmol/L   Chloride 106 101 - 111 mmol/L   CO2 20 (L) 22 - 32 mmol/L   Glucose, Bld 151 (H) 65 - 99 mg/dL   BUN 15 6 - 20 mg/dL   Creatinine, Ser 0.86 0.61 - 1.24 mg/dL   Calcium 8.9 8.9 - 57.8 mg/dL   GFR calc non Af Amer >60 >60 mL/min   GFR calc Af Amer >60 >60 mL/min   Anion gap 6 5 - 15    Discharge Medications:   Allergies as of 04/24/2017      Reactions   Penicillins Rash   Has patient had a PCN reaction causing immediate rash, facial/tongue/throat swelling, SOB or lightheadedness with hypotension: yes Has patient had a PCN reaction causing severe rash involving mucus membranes or skin necrosis: no Has patient had a PCN reaction that required hospitalization: no Has patient had a PCN reaction occurring within the last 10 years: no If all of the above answers are "NO", then may proceed with Cephalosporin use.   Latex Rash      Medication List    STOP taking these medications   oxyCODONE 5 MG immediate release tablet Commonly known as:  Oxy IR/ROXICODONE Replaced by:  oxyCODONE 10 mg 12 hr tablet     TAKE these medications   ALPRAZolam 1 MG tablet Commonly known as:  XANAX Take 0.5-1 mg by mouth at bedtime as needed for anxiety or sleep.   amphetamine-dextroamphetamine 20 MG tablet Commonly known as:  ADDERALL Take 20 mg by mouth 2 (two) times daily.   BIOTRUE Soln Place 1 drop into both eyes 3 (three) times daily as needed (for rewetting contact lenses.).   CIALIS 5 MG tablet Generic drug:  tadalafil Take 5 mg by mouth daily as needed for erectile dysfunction.   Clindamycin Phosphate foam Apply 1 application topically daily. Applied head to toe once daily   doxycycline 100 MG tablet Commonly known as:  VIBRA-TABS Take 1 tablet (100 mg total) by mouth 2 (two) times daily.   ferrous gluconate 324 MG tablet Commonly known as:  FERGON Take 1 tablet (324 mg total) by mouth 2 (two) times daily with a meal.   HUMIRA 40 MG/0.8ML Pskt Generic drug:   Adalimumab Inject 40 mg into the skin every Friday.   hydrOXYzine 25 MG tablet Commonly known as:  ATARAX/VISTARIL Take 25 mg by mouth 3 (three) times daily.   levocetirizine 5 MG tablet Commonly known as:  XYZAL Take 5  mg by mouth at bedtime.   lisinopril-hydrochlorothiazide 20-12.5 MG tablet Commonly known as:  PRINZIDE,ZESTORETIC Take 2 tablets by mouth daily.   methocarbamol 500 MG tablet Commonly known as:  ROBAXIN Take 1 tablet (500 mg total) by mouth every 6 (six) hours as needed for muscle spasms.   multivitamin with minerals Tabs tablet Take 1 tablet by mouth daily. What changed:  when to take this  additional instructions   omeprazole 20 MG capsule Commonly known as:  PRILOSEC Take 1 capsule (20 mg total) by mouth 2 (two) times daily.   oxyCODONE 10 mg 12 hr tablet Commonly known as:  OXYCONTIN Take 1 tablet (10 mg total) by mouth every 12 (twelve) hours. Replaces:  oxyCODONE 5 MG immediate release tablet   oxyCODONE 5 MG immediate release tablet Commonly known as:  Oxy IR/ROXICODONE Take 1 tablet (5 mg total) by mouth every 4 (four) hours as needed for severe pain (may take one to two every 4 hours max). Take for break through pain   potassium chloride 10 MEQ CR capsule Commonly known as:  MICRO-K Take 10 mEq by mouth daily.   PROAIR HFA 108 (90 Base) MCG/ACT inhaler Generic drug:  albuterol Inhale 2 puffs into the lungs every 4 (four) hours as needed for wheezing or shortness of breath.   propranolol 10 MG tablet Commonly known as:  INDERAL Take 10 mg by mouth 2 (two) times daily.   SSD 1 % cream Generic drug:  silver sulfADIAZINE Apply 1 application topically daily.   SYSTANE ULTRA 0.4-0.3 % Soln Generic drug:  Polyethyl Glycol-Propyl Glycol Place 1-2 drops into both eyes 3 (three) times daily as needed (for dry eyes).   traZODone 100 MG tablet Commonly known as:  DESYREL Take 200 mg by mouth at bedtime.   zolpidem 10 MG tablet Commonly  known as:  AMBIEN Take 15 mg by mouth at bedtime. What changed:  Another medication with the same name was added. Make sure you understand how and when to take each.   zolpidem 5 MG tablet Commonly known as:  AMBIEN Take 3 tablets (15 mg total) by mouth at bedtime. What changed:  You were already taking a medication with the same name, and this prescription was added. Make sure you understand how and when to take each.            Durable Medical Equipment        Start     Ordered   04/24/17 0000  For home use only DME 4 wheeled rolling walker with seat    Question Answer Comment  Patient needs a walker to treat with the following condition S/P lumbar laminectomy   Patient needs a walker to treat with the following condition Transient right leg weakness      04/24/17 1653      Diagnostic Studies: Dg Lumbar Spine 2-3 Views  Result Date: 04/23/2017 CLINICAL DATA:  Intraoperative localization film for patient undergoing L4-5 decompression possible discectomy. EXAM: LUMBAR SPINE - 2-3 VIEW COMPARISON:  MRI lumbar spine 02/18/2017. FINDINGS: Two intraoperative views of the lumbar spine in the lateral projection are provided. On the first image, probes are directed toward the L4-5 and L5-S1 disc interspaces. On the second image, a probe is seen at the level of the inferior aspect of the L5 pedicles. IMPRESSION: Localization as above. Electronically Signed   By: Drusilla Kannerhomas  Dalessio M.D.   On: 04/23/2017 15:05    Disposition: 01-Home or Self Care  Discharge Instructions  Call MD / Call 911    Complete by:  As directed    If you experience chest pain or shortness of breath, CALL 911 and be transported to the hospital emergency room.  If you develope a fever above 101 F, pus (white drainage) or increased drainage or redness at the wound, or calf pain, call your surgeon's office.   Constipation Prevention    Complete by:  As directed    Drink plenty of fluids.  Prune juice may be helpful.   You may use a stool softener, such as Colace (over the counter) 100 mg twice a day.  Use MiraLax (over the counter) for constipation as needed.   Diet - low sodium heart healthy    Complete by:  As directed    Driving restrictions    Complete by:  As directed    No driving for 8 weeks   For home use only DME 4 wheeled rolling walker with seat    Complete by:  As directed    Patient needs a walker to treat with the following condition:   S/P lumbar laminectomy Transient right leg weakness     Increase activity slowly as tolerated    Complete by:  As directed    Lifting restrictions    Complete by:  As directed    No lifting for 8 weeks      Follow-up Information    Kerrin Champagne, MD In 2 weeks.   Specialty:  Orthopedic Surgery Why:  For wound re-check Contact information: [address] [place] [address] [phone]            Signed: Kerrin Champagne 04/24/2017, 5:13 PM

## 2017-04-26 ENCOUNTER — Encounter (INDEPENDENT_AMBULATORY_CARE_PROVIDER_SITE_OTHER): Payer: Self-pay | Admitting: Specialist

## 2017-04-26 ENCOUNTER — Encounter (HOSPITAL_COMMUNITY): Payer: Self-pay | Admitting: Specialist

## 2017-04-26 ENCOUNTER — Telehealth (INDEPENDENT_AMBULATORY_CARE_PROVIDER_SITE_OTHER): Payer: Self-pay | Admitting: Specialist

## 2017-04-26 NOTE — OR Nursing (Signed)
Late entry due to delay code documentation. 

## 2017-04-26 NOTE — Telephone Encounter (Signed)
I spoke with Dr. Havery MorosNikta about this patient, he states that patient needs to keep a dry dressing on the incision, and once it is changed they may be pulling off scabs and making it bleed some each time causing minor bleeding.  But if it like a tablespoon at a time that this would not be normal to go to ER.  Also if he has any fevers or chills he needs to call MD on call ASAP to advise them.-----Pt has been advised and he understands.

## 2017-04-26 NOTE — Telephone Encounter (Signed)
Patient called advised he sent Dr Otelia SergeantNitka a picture of his sutures on My Chart. Patient said he has had a little bleeding or drainage everyday. Patient asked for a call back as soon as possible. The number to contact patient is [phone]

## 2017-04-29 ENCOUNTER — Other Ambulatory Visit (INDEPENDENT_AMBULATORY_CARE_PROVIDER_SITE_OTHER): Payer: Self-pay | Admitting: Specialist

## 2017-04-29 ENCOUNTER — Encounter (INDEPENDENT_AMBULATORY_CARE_PROVIDER_SITE_OTHER): Payer: Self-pay | Admitting: Specialist

## 2017-04-29 MED ORDER — OXYCODONE HCL 5 MG PO TABS: 10.0000 mg | ORAL_TABLET | ORAL | 0 refills | Status: DC | PRN

## 2017-04-29 NOTE — Progress Notes (Signed)
I called and advised that he could double up on his meds and that Dr. Otelia SergeantNitka did write another Rx for him and that it is at the front desk for pick up

## 2017-05-06 ENCOUNTER — Other Ambulatory Visit (INDEPENDENT_AMBULATORY_CARE_PROVIDER_SITE_OTHER): Payer: Self-pay | Admitting: Specialist

## 2017-05-06 MED ORDER — OXYCODONE HCL ER 10 MG PO T12A: 10.0000 mg | EXTENDED_RELEASE_TABLET | Freq: Two times a day (BID) | ORAL | 0 refills | Status: DC

## 2017-05-06 MED ORDER — OXYCODONE HCL 5 MG PO TABS: 10.0000 mg | ORAL_TABLET | ORAL | 0 refills | Status: DC | PRN

## 2017-05-06 NOTE — Addendum Note (Signed)
Addended by: Penne LashSHUE WILLS, Neysa BonitoHRISTY N on: 05/06/2017 02:12 PM   Modules accepted: Orders

## 2017-05-06 NOTE — Telephone Encounter (Signed)
Patient called needing a refill on oxycodone and oxycotton. CB # [phone]

## 2017-05-07 NOTE — Telephone Encounter (Signed)
patient is aware rx is ready for pick up

## 2017-05-08 ENCOUNTER — Inpatient Hospital Stay (INDEPENDENT_AMBULATORY_CARE_PROVIDER_SITE_OTHER): Payer: BLUE CROSS/BLUE SHIELD | Admitting: Specialist

## 2017-05-09 ENCOUNTER — Encounter (INDEPENDENT_AMBULATORY_CARE_PROVIDER_SITE_OTHER): Payer: Self-pay | Admitting: Specialist

## 2017-05-09 ENCOUNTER — Ambulatory Visit (INDEPENDENT_AMBULATORY_CARE_PROVIDER_SITE_OTHER): Payer: BLUE CROSS/BLUE SHIELD | Admitting: Specialist

## 2017-05-09 VITALS — BP 107/66 | HR 92 | Ht 71.0 in | Wt 190.0 lb

## 2017-05-09 DIAGNOSIS — L732 Hidradenitis suppurativa: Secondary | ICD-10-CM

## 2017-05-09 DIAGNOSIS — Z9889 Other specified postprocedural states: Secondary | ICD-10-CM

## 2017-05-09 MED ORDER — DOXYCYCLINE HYCLATE 100 MG PO TABS: 100.0000 mg | ORAL_TABLET | Freq: Two times a day (BID) | ORAL | 0 refills | Status: DC

## 2017-05-09 MED ORDER — OXYCODONE HCL 5 MG PO TABS: 10.0000 mg | ORAL_TABLET | ORAL | 0 refills | Status: DC | PRN

## 2017-05-09 MED ORDER — GABAPENTIN 100 MG PO CAPS: 100.0000 mg | ORAL_CAPSULE | Freq: Every day | ORAL | 3 refills | Status: DC
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
## 2017-05-09 NOTE — Patient Instructions (Addendum)
Avoid frequent bending and stooping  No lifting greater than 10 lbs. May use ice or moist heat for pain. Weight loss is of benefit. Handicap license is approved. Continue the doxycycline 100 mg BID. Oxycodone 5-10 mg every 4-6 hours for pain.  Gabapentin 100mg  po at night.

## 2017-05-16 ENCOUNTER — Other Ambulatory Visit (INDEPENDENT_AMBULATORY_CARE_PROVIDER_SITE_OTHER): Payer: Self-pay | Admitting: Specialist

## 2017-05-16 DIAGNOSIS — Z9889 Other specified postprocedural states: Secondary | ICD-10-CM

## 2017-05-16 MED ORDER — OXYCODONE HCL 5 MG PO TABS: 10.0000 mg | ORAL_TABLET | ORAL | 0 refills | Status: DC | PRN

## 2017-05-16 NOTE — Addendum Note (Signed)
Addended by: Penne LashSHUE WILLS, Otis DialsHRISTY N on: 05/16/2017 01:36 PM   Modules accepted: Orders

## 2017-05-16 NOTE — Telephone Encounter (Signed)
Patient request Rx refill on Oxycodone, he states that the instructions said 1-2 tablets but he was taking 2-4 tablets instead. He said he did not realize the instructions said 1-2 until he was on his last few pills.

## 2017-05-17 NOTE — Telephone Encounter (Signed)
lmom advising rx was ready for pick up and that he needs to decrease the amount he is taking

## 2017-05-22 ENCOUNTER — Telehealth (INDEPENDENT_AMBULATORY_CARE_PROVIDER_SITE_OTHER): Payer: Self-pay | Admitting: Radiology

## 2017-05-22 NOTE — Telephone Encounter (Signed)
I called and advised patient of Dr. Nitka's message 

## 2017-05-22 NOTE — Telephone Encounter (Signed)
Patient is calling wanting to know if he can start [facility] and the Accutane from the Dermatologist standpoint.  They are requesting that he start them back.  Please advise and I will call him back.

## 2017-05-22 NOTE — Telephone Encounter (Signed)
He is nearly a month following his surgery so that healing is nealy completed or at least has been given a good start. He may resume the humera and other meds he was taking. jen

## 2017-05-23 ENCOUNTER — Other Ambulatory Visit (INDEPENDENT_AMBULATORY_CARE_PROVIDER_SITE_OTHER): Payer: Self-pay | Admitting: Specialist

## 2017-05-23 NOTE — Telephone Encounter (Signed)
Patient called needing Rx refilled (Oxycodone) Patient asked if he can get less than 60 tabs. Patient request 40 or 50 tabs. The number to contact patient is [phone]

## 2017-05-24 ENCOUNTER — Telehealth (INDEPENDENT_AMBULATORY_CARE_PROVIDER_SITE_OTHER): Payer: Self-pay

## 2017-05-24 NOTE — Telephone Encounter (Signed)
[name] needing verbal orders for patient for 1 x week for 1 week and 2 x week for 3 weeks.  Cb# is [phone].  Please advise.  Thank you.

## 2017-05-27 ENCOUNTER — Telehealth (INDEPENDENT_AMBULATORY_CARE_PROVIDER_SITE_OTHER): Payer: Self-pay | Admitting: Radiology

## 2017-05-27 ENCOUNTER — Other Ambulatory Visit (INDEPENDENT_AMBULATORY_CARE_PROVIDER_SITE_OTHER): Payer: Self-pay | Admitting: Specialist

## 2017-05-27 MED ORDER — METHOCARBAMOL 500 MG PO TABS: 500.0000 mg | ORAL_TABLET | Freq: Four times a day (QID) | ORAL | 1 refills | Status: DC | PRN

## 2017-05-27 MED ORDER — OXYCODONE HCL 5 MG PO CAPS: 5.0000 mg | ORAL_CAPSULE | ORAL | 0 refills | Status: DC | PRN

## 2017-05-27 NOTE — Telephone Encounter (Signed)
I called and gave verbal ok for this. 

## 2017-05-27 NOTE — Telephone Encounter (Signed)
[name] called on checking on status of patients Rx of oxycodone and methocarbamol.  Patient is having PT session at 2pm today. Wants to to pick up Rx today while in town.  CB # [phone] when ready. He is aware Dr. Otelia SergeantNitka will not be in office until 1pm today.

## 2017-05-27 NOTE — Telephone Encounter (Signed)
This has been done.

## 2017-05-30 ENCOUNTER — Ambulatory Visit (INDEPENDENT_AMBULATORY_CARE_PROVIDER_SITE_OTHER): Payer: Self-pay

## 2017-05-30 ENCOUNTER — Ambulatory Visit (INDEPENDENT_AMBULATORY_CARE_PROVIDER_SITE_OTHER): Payer: BLUE CROSS/BLUE SHIELD | Admitting: Specialist

## 2017-05-30 ENCOUNTER — Encounter (INDEPENDENT_AMBULATORY_CARE_PROVIDER_SITE_OTHER): Payer: Self-pay | Admitting: Specialist

## 2017-05-30 VITALS — BP 105/63 | HR 85 | Ht 71.0 in | Wt 190.0 lb

## 2017-05-30 DIAGNOSIS — Z9889 Other specified postprocedural states: Secondary | ICD-10-CM
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
## 2017-05-30 NOTE — Telephone Encounter (Signed)
error 

## 2017-06-03 ENCOUNTER — Telehealth (INDEPENDENT_AMBULATORY_CARE_PROVIDER_SITE_OTHER): Payer: Self-pay | Admitting: Specialist

## 2017-06-03 NOTE — Telephone Encounter (Signed)
Please advise---I called and advised patient that he just an rx on 05/27/17 that should last 10 days, patient states that he thought it was for only 7 days.  Rx is due on 06/06/2017----

## 2017-06-03 NOTE — Telephone Encounter (Signed)
PT REQUESTS REFILL OF PAIN MEDS PLEASE.  [phone]

## 2017-06-05 ENCOUNTER — Other Ambulatory Visit (INDEPENDENT_AMBULATORY_CARE_PROVIDER_SITE_OTHER): Payer: Self-pay | Admitting: Radiology

## 2017-06-05 MED ORDER — HYDROCODONE-ACETAMINOPHEN 7.5-325 MG PO TABS: 1.0000 | ORAL_TABLET | Freq: Four times a day (QID) | ORAL | 0 refills | Status: DC | PRN

## 2017-06-05 NOTE — Telephone Encounter (Signed)
Stop oxy.  Time to wean.  Ok to give norco 7.5/325 po Q6hrs prn pain #50 tablets.

## 2017-06-06 NOTE — Telephone Encounter (Signed)
lmom that rx was changed and that we put it at the front desk

## 2017-06-10 ENCOUNTER — Other Ambulatory Visit (INDEPENDENT_AMBULATORY_CARE_PROVIDER_SITE_OTHER): Payer: Self-pay | Admitting: Specialist

## 2017-06-15 ENCOUNTER — Encounter (INDEPENDENT_AMBULATORY_CARE_PROVIDER_SITE_OTHER): Payer: Self-pay | Admitting: Specialist

## 2017-06-17 ENCOUNTER — Other Ambulatory Visit (INDEPENDENT_AMBULATORY_CARE_PROVIDER_SITE_OTHER): Payer: Self-pay | Admitting: Specialist

## 2017-06-17 MED ORDER — HYDROCODONE-IBUPROFEN 7.5-200 MG PO TABS: 1.0000 | ORAL_TABLET | Freq: Three times a day (TID) | ORAL | 0 refills | Status: DC | PRN

## 2017-06-27 ENCOUNTER — Encounter (INDEPENDENT_AMBULATORY_CARE_PROVIDER_SITE_OTHER): Payer: Self-pay | Admitting: Specialist

## 2017-06-27 ENCOUNTER — Ambulatory Visit (INDEPENDENT_AMBULATORY_CARE_PROVIDER_SITE_OTHER): Payer: BLUE CROSS/BLUE SHIELD | Admitting: Specialist

## 2017-06-27 VITALS — BP 112/71 | HR 93

## 2017-06-27 DIAGNOSIS — Z9889 Other specified postprocedural states: Secondary | ICD-10-CM

## 2017-06-27 MED ORDER — METHOCARBAMOL 500 MG PO TABS: 500.0000 mg | ORAL_TABLET | Freq: Three times a day (TID) | ORAL | 0 refills | Status: DC | PRN
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
## 2017-06-27 NOTE — Addendum Note (Signed)
Addended by: Albertina ParrGARCIA, Delayla Hoffmaster on: 06/27/2017 03:47 PM   Modules accepted: Orders

## 2017-07-10 ENCOUNTER — Ambulatory Visit (INDEPENDENT_AMBULATORY_CARE_PROVIDER_SITE_OTHER): Payer: BLUE CROSS/BLUE SHIELD | Admitting: Surgery

## 2017-07-19 ENCOUNTER — Encounter (INDEPENDENT_AMBULATORY_CARE_PROVIDER_SITE_OTHER): Payer: Self-pay | Admitting: Specialist

## 2017-08-01 ENCOUNTER — Encounter (INDEPENDENT_AMBULATORY_CARE_PROVIDER_SITE_OTHER): Payer: Self-pay | Admitting: Surgery

## 2017-08-01 ENCOUNTER — Ambulatory Visit (INDEPENDENT_AMBULATORY_CARE_PROVIDER_SITE_OTHER): Payer: BLUE CROSS/BLUE SHIELD | Admitting: Specialist

## 2017-08-01 ENCOUNTER — Ambulatory Visit (INDEPENDENT_AMBULATORY_CARE_PROVIDER_SITE_OTHER): Payer: BLUE CROSS/BLUE SHIELD | Admitting: Surgery

## 2017-08-01 VITALS — BP 117/65 | HR 74 | Ht 70.0 in | Wt 190.0 lb

## 2017-08-01 DIAGNOSIS — Z9889 Other specified postprocedural states: Secondary | ICD-10-CM | POA: Diagnosis not present

## 2017-08-01 MED ORDER — METHOCARBAMOL 500 MG PO TABS: 500.0000 mg | ORAL_TABLET | Freq: Three times a day (TID) | ORAL | 0 refills | Status: DC | PRN

## 2017-08-01 MED ORDER — METHOCARBAMOL 500 MG PO TABS: 500.0000 mg | ORAL_TABLET | Freq: Four times a day (QID) | ORAL | 0 refills | Status: DC
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
## 2017-09-28 ENCOUNTER — Encounter (HOSPITAL_COMMUNITY)
Admission: EM | Disposition: A | Discharge status: Home Health | Payer: Self-pay | Source: Home / Self Care | Attending: Internal Medicine

## 2017-09-28 ENCOUNTER — Inpatient Hospital Stay (HOSPITAL_COMMUNITY)
Admission: EM | Admit: 2017-09-28 | Discharge: 2017-10-04 | DRG: 987 | Disposition: A | Discharge status: Home Health | Payer: BLUE CROSS/BLUE SHIELD | Attending: Internal Medicine | Admitting: Internal Medicine

## 2017-09-28 ENCOUNTER — Encounter (HOSPITAL_COMMUNITY): Payer: Self-pay | Admitting: *Deleted

## 2017-09-28 DIAGNOSIS — G4700 Insomnia, unspecified: Secondary | ICD-10-CM | POA: Diagnosis present

## 2017-09-28 DIAGNOSIS — Z880 Allergy status to penicillin: Secondary | ICD-10-CM

## 2017-09-28 DIAGNOSIS — F1010 Alcohol abuse, uncomplicated: Secondary | ICD-10-CM | POA: Diagnosis present

## 2017-09-28 DIAGNOSIS — K922 Gastrointestinal hemorrhage, unspecified: Secondary | ICD-10-CM | POA: Diagnosis present

## 2017-09-28 DIAGNOSIS — D684 Acquired coagulation factor deficiency: Secondary | ICD-10-CM | POA: Diagnosis present

## 2017-09-28 DIAGNOSIS — L02212 Cutaneous abscess of back [any part, except buttock]: Secondary | ICD-10-CM | POA: Diagnosis present

## 2017-09-28 DIAGNOSIS — L0231 Cutaneous abscess of buttock: Secondary | ICD-10-CM | POA: Diagnosis present

## 2017-09-28 DIAGNOSIS — F17210 Nicotine dependence, cigarettes, uncomplicated: Secondary | ICD-10-CM | POA: Diagnosis present

## 2017-09-28 DIAGNOSIS — G4733 Obstructive sleep apnea (adult) (pediatric): Secondary | ICD-10-CM | POA: Diagnosis present

## 2017-09-28 DIAGNOSIS — K3182 Dieulafoy lesion (hemorrhagic) of stomach and duodenum: Secondary | ICD-10-CM | POA: Diagnosis present

## 2017-09-28 DIAGNOSIS — L8990 Pressure ulcer of unspecified site, unspecified stage: Secondary | ICD-10-CM | POA: Diagnosis present

## 2017-09-28 DIAGNOSIS — F329 Major depressive disorder, single episode, unspecified: Secondary | ICD-10-CM | POA: Diagnosis present

## 2017-09-28 DIAGNOSIS — K219 Gastro-esophageal reflux disease without esophagitis: Secondary | ICD-10-CM | POA: Diagnosis present

## 2017-09-28 DIAGNOSIS — E872 Acidosis: Secondary | ICD-10-CM | POA: Diagnosis present

## 2017-09-28 DIAGNOSIS — K7031 Alcoholic cirrhosis of liver with ascites: Secondary | ICD-10-CM | POA: Diagnosis not present

## 2017-09-28 DIAGNOSIS — J45909 Unspecified asthma, uncomplicated: Secondary | ICD-10-CM | POA: Diagnosis present

## 2017-09-28 DIAGNOSIS — K766 Portal hypertension: Secondary | ICD-10-CM | POA: Diagnosis present

## 2017-09-28 DIAGNOSIS — R0902 Hypoxemia: Secondary | ICD-10-CM | POA: Diagnosis not present

## 2017-09-28 DIAGNOSIS — K264 Chronic or unspecified duodenal ulcer with hemorrhage: Secondary | ICD-10-CM | POA: Diagnosis not present

## 2017-09-28 DIAGNOSIS — E875 Hyperkalemia: Secondary | ICD-10-CM | POA: Diagnosis present

## 2017-09-28 DIAGNOSIS — L732 Hidradenitis suppurativa: Secondary | ICD-10-CM | POA: Diagnosis present

## 2017-09-28 DIAGNOSIS — Z79899 Other long term (current) drug therapy: Secondary | ICD-10-CM

## 2017-09-28 DIAGNOSIS — K7200 Acute and subacute hepatic failure without coma: Secondary | ICD-10-CM | POA: Diagnosis not present

## 2017-09-28 DIAGNOSIS — D689 Coagulation defect, unspecified: Secondary | ICD-10-CM | POA: Diagnosis not present

## 2017-09-28 DIAGNOSIS — L0291 Cutaneous abscess, unspecified: Secondary | ICD-10-CM | POA: Diagnosis not present

## 2017-09-28 DIAGNOSIS — I8511 Secondary esophageal varices with bleeding: Secondary | ICD-10-CM | POA: Diagnosis present

## 2017-09-28 DIAGNOSIS — F419 Anxiety disorder, unspecified: Secondary | ICD-10-CM | POA: Diagnosis present

## 2017-09-28 DIAGNOSIS — I10 Essential (primary) hypertension: Secondary | ICD-10-CM | POA: Diagnosis present

## 2017-09-28 DIAGNOSIS — K7030 Alcoholic cirrhosis of liver without ascites: Principal | ICD-10-CM | POA: Diagnosis present

## 2017-09-28 DIAGNOSIS — K7290 Hepatic failure, unspecified without coma: Secondary | ICD-10-CM | POA: Diagnosis present

## 2017-09-28 DIAGNOSIS — L02414 Cutaneous abscess of left upper limb: Secondary | ICD-10-CM | POA: Diagnosis present

## 2017-09-28 DIAGNOSIS — D62 Acute posthemorrhagic anemia: Secondary | ICD-10-CM | POA: Diagnosis present

## 2017-09-28 DIAGNOSIS — L02413 Cutaneous abscess of right upper limb: Secondary | ICD-10-CM | POA: Diagnosis present

## 2017-09-28 DIAGNOSIS — R571 Hypovolemic shock: Secondary | ICD-10-CM

## 2017-09-28 DIAGNOSIS — Z8614 Personal history of Methicillin resistant Staphylococcus aureus infection: Secondary | ICD-10-CM | POA: Diagnosis not present

## 2017-09-28 DIAGNOSIS — Z9049 Acquired absence of other specified parts of digestive tract: Secondary | ICD-10-CM

## 2017-09-28 HISTORY — PX: ESOPHAGOGASTRODUODENOSCOPY: SHX5428

## 2017-09-28 LAB — CBC
HEMATOCRIT: 20.6 % — AB (ref 39.0–52.0)
HEMATOCRIT: 20.1 % — AB (ref 39.0–52.0)
HEMOGLOBIN: 7.4 g/dL — AB (ref 13.0–17.0)
HEMOGLOBIN: 7.1 g/dL — AB (ref 13.0–17.0)
MCH: 31 pg (ref 26.0–34.0)
MCH: 30.6 pg (ref 26.0–34.0)
MCHC: 35.9 g/dL (ref 30.0–36.0)
MCHC: 35.3 g/dL (ref 30.0–36.0)
MCV: 86.2 fL (ref 78.0–100.0)
MCV: 86.6 fL (ref 78.0–100.0)
Platelets: 110 10*3/uL — ABNORMAL LOW (ref 150–400)
Platelets: 126 10*3/uL — ABNORMAL LOW (ref 150–400)
RBC: 2.39 MIL/uL — ABNORMAL LOW (ref 4.22–5.81)
RBC: 2.32 MIL/uL — AB (ref 4.22–5.81)
RDW: 15.1 % (ref 11.5–15.5)
RDW: 15.5 % (ref 11.5–15.5)
WBC: 18 10*3/uL — AB (ref 4.0–10.5)
WBC: 14.3 10*3/uL — ABNORMAL HIGH (ref 4.0–10.5)

## 2017-09-28 LAB — COMPREHENSIVE METABOLIC PANEL
ALK PHOS: 78 U/L (ref 38–126)
ALT: 12 U/L — ABNORMAL LOW (ref 17–63)
ANION GAP: 9 (ref 5–15)
AST: 29 U/L (ref 15–41)
Albumin: 2.5 g/dL — ABNORMAL LOW (ref 3.5–5.0)
BILIRUBIN TOTAL: 1.1 mg/dL (ref 0.3–1.2)
BUN: 47 mg/dL — ABNORMAL HIGH (ref 6–20)
CALCIUM: 8.2 mg/dL — AB (ref 8.9–10.3)
CO2: 13 mmol/L — ABNORMAL LOW (ref 22–32)
Chloride: 110 mmol/L (ref 101–111)
Creatinine, Ser: 3 mg/dL — ABNORMAL HIGH (ref 0.61–1.24)
GFR calc non Af Amer: 23 mL/min — ABNORMAL LOW (ref >60)
GFR, EST AFRICAN AMERICAN: 26 mL/min — AB (ref >60)
Glucose, Bld: 147 mg/dL — ABNORMAL HIGH (ref 65–99)
Potassium: 7 mmol/L (ref 3.5–5.1)
Sodium: 132 mmol/L — ABNORMAL LOW (ref 135–145)
TOTAL PROTEIN: 5.7 g/dL — AB (ref 6.5–8.1)

## 2017-09-28 LAB — ABO/RH: ABO/RH(D): O POS

## 2017-09-28 LAB — CBC WITH DIFFERENTIAL/PLATELET
BASOS ABS: 0 10*3/uL (ref 0.0–0.1)
BASOS PCT: 0 %
Basophils Absolute: 0 10*3/uL (ref 0.0–0.1)
Basophils Relative: 0 %
Eosinophils Absolute: 0.1 10*3/uL (ref 0.0–0.7)
Eosinophils Absolute: 0.1 10*3/uL (ref 0.0–0.7)
Eosinophils Relative: 0 %
Eosinophils Relative: 0 %
HEMATOCRIT: 20.4 % — AB (ref 39.0–52.0)
HEMATOCRIT: 27.7 % — AB (ref 39.0–52.0)
HEMOGLOBIN: 6.9 g/dL — AB (ref 13.0–17.0)
Hemoglobin: 9.6 g/dL — ABNORMAL LOW (ref 13.0–17.0)
LYMPHS ABS: 4.1 10*3/uL — AB (ref 0.7–4.0)
LYMPHS PCT: 19 %
Lymphocytes Relative: 19 %
Lymphs Abs: 2.6 10*3/uL (ref 0.7–4.0)
MCH: 31.4 pg (ref 26.0–34.0)
MCH: 31.1 pg (ref 26.0–34.0)
MCHC: 33.8 g/dL (ref 30.0–36.0)
MCHC: 34.7 g/dL (ref 30.0–36.0)
MCV: 92.7 fL (ref 78.0–100.0)
MCV: 89.6 fL (ref 78.0–100.0)
MONO ABS: 1.8 10*3/uL — AB (ref 0.1–1.0)
MONOS PCT: 6 %
Monocytes Absolute: 1.3 10*3/uL — ABNORMAL HIGH (ref 0.1–1.0)
Monocytes Relative: 13 %
NEUTROS ABS: 15.5 10*3/uL — AB (ref 1.7–7.7)
NEUTROS PCT: 74 %
Neutro Abs: 9.4 10*3/uL — ABNORMAL HIGH (ref 1.7–7.7)
Neutrophils Relative %: 68 %
Platelets: 238 10*3/uL (ref 150–400)
Platelets: 121 10*3/uL — ABNORMAL LOW (ref 150–400)
RBC: 2.2 MIL/uL — ABNORMAL LOW (ref 4.22–5.81)
RBC: 3.09 MIL/uL — ABNORMAL LOW (ref 4.22–5.81)
RDW: 15.3 % (ref 11.5–15.5)
RDW: 14.6 % (ref 11.5–15.5)
WBC: 20.9 10*3/uL — ABNORMAL HIGH (ref 4.0–10.5)
WBC: 13.9 10*3/uL — ABNORMAL HIGH (ref 4.0–10.5)

## 2017-09-28 LAB — LACTIC ACID, PLASMA: LACTIC ACID, VENOUS: 3.2 mmol/L — AB (ref 0.5–1.9)

## 2017-09-28 LAB — BASIC METABOLIC PANEL
ANION GAP: 9 (ref 5–15)
BUN: 41 mg/dL — ABNORMAL HIGH (ref 6–20)
CALCIUM: 8.4 mg/dL — AB (ref 8.9–10.3)
CO2: 13 mmol/L — AB (ref 22–32)
CREATININE: 2.39 mg/dL — AB (ref 0.61–1.24)
Chloride: 115 mmol/L — ABNORMAL HIGH (ref 101–111)
GFR calc Af Amer: 35 mL/min — ABNORMAL LOW (ref >60)
GFR, EST NON AFRICAN AMERICAN: 30 mL/min — AB (ref >60)
GLUCOSE: 96 mg/dL (ref 65–99)
Potassium: 5.5 mmol/L — ABNORMAL HIGH (ref 3.5–5.1)
Sodium: 137 mmol/L (ref 135–145)

## 2017-09-28 LAB — PROTIME-INR
INR: 1.89
INR: 1.84
PROTHROMBIN TIME: 21.1 s — AB (ref 11.4–15.2)
Prothrombin Time: 21.6 seconds — ABNORMAL HIGH (ref 11.4–15.2)

## 2017-09-28 LAB — I-STAT CG4 LACTIC ACID, ED: LACTIC ACID, VENOUS: 5.07 mmol/L — AB (ref 0.5–1.9)

## 2017-09-28 LAB — MRSA PCR SCREENING: MRSA BY PCR: NEGATIVE

## 2017-09-28 LAB — I-STAT TROPONIN, ED: TROPONIN I, POC: 0.01 ng/mL (ref 0.00–0.08)

## 2017-09-28 LAB — CBG MONITORING, ED: Glucose-Capillary: 126 mg/dL — ABNORMAL HIGH (ref 65–99)

## 2017-09-28 LAB — GLUCOSE, CAPILLARY
GLUCOSE-CAPILLARY: 132 mg/dL — AB (ref 65–99)
GLUCOSE-CAPILLARY: 99 mg/dL (ref 65–99)

## 2017-09-28 LAB — PREPARE RBC (CROSSMATCH)

## 2017-09-28 LAB — AMMONIA: AMMONIA: 216 umol/L — AB (ref 9–35)

## 2017-09-28 SURGERY — EGD (ESOPHAGOGASTRODUODENOSCOPY)
Anesthesia: Moderate Sedation

## 2017-09-28 MED ORDER — SODIUM CHLORIDE 0.9 % IV SOLN
Freq: Once | INTRAVENOUS | Status: AC
  Administered 2017-09-28: 09:00:00 via INTRAVENOUS

## 2017-09-28 MED ORDER — DEXTROSE 5 % IV SOLN
0.0000 ug/min | INTRAVENOUS | Status: DC
  Administered 2017-09-28: 10 ug/min via INTRAVENOUS
  Filled 2017-09-28: qty 4

## 2017-09-28 MED ORDER — SODIUM CHLORIDE 0.9 % IV SOLN: INTRAVENOUS | Status: DC

## 2017-09-28 MED ORDER — CALCIUM CHLORIDE 10 % IV SOLN
1.0000 g | Freq: Once | INTRAVENOUS | Status: AC
  Administered 2017-09-28: 1 g via INTRAVENOUS

## 2017-09-28 MED ORDER — EPINEPHRINE PF 1 MG/10ML IJ SOSY
PREFILLED_SYRINGE | INTRAMUSCULAR | Status: DC | PRN
  Administered 2017-09-28: 2 mL

## 2017-09-28 MED ORDER — DEXTROSE 5 % IV SOLN
500.0000 mg | INTRAVENOUS | Status: DC
  Administered 2017-09-28 – 2017-10-01 (×4): 500 mg via INTRAVENOUS
  Filled 2017-09-28 (×4): qty 500

## 2017-09-28 MED ORDER — SODIUM CHLORIDE 0.9 % IV SOLN: 10.0000 mL/h | Freq: Once | INTRAVENOUS | Status: DC

## 2017-09-28 MED ORDER — MIDAZOLAM HCL 10 MG/2ML IJ SOLN
INTRAMUSCULAR | Status: DC | PRN
  Administered 2017-09-28 (×4): 1 mg via INTRAVENOUS

## 2017-09-28 MED ORDER — LORAZEPAM 2 MG/ML IJ SOLN
0.5000 mg | INTRAMUSCULAR | Status: DC | PRN
  Administered 2017-09-28: 0.5 mg via INTRAVENOUS
  Filled 2017-09-28: qty 1

## 2017-09-28 MED ORDER — SODIUM CHLORIDE 0.9 % IV SOLN
8.0000 mg/h | INTRAVENOUS | Status: DC
  Administered 2017-09-28 – 2017-09-30 (×6): 8 mg/h via INTRAVENOUS
  Filled 2017-09-28 (×12): qty 80

## 2017-09-28 MED ORDER — SODIUM CHLORIDE 0.9 % IV SOLN
80.0000 mg | Freq: Once | INTRAVENOUS | Status: AC
  Administered 2017-09-28: 80 mg via INTRAVENOUS
  Filled 2017-09-28: qty 80

## 2017-09-28 MED ORDER — SODIUM CHLORIDE 0.9 % IV SOLN
50.0000 ug/h | INTRAVENOUS | Status: DC
  Administered 2017-09-28 – 2017-10-02 (×7): 50 ug/h via INTRAVENOUS
  Filled 2017-09-28 (×15): qty 1

## 2017-09-28 MED ORDER — IPRATROPIUM-ALBUTEROL 0.5-2.5 (3) MG/3ML IN SOLN
3.0000 mL | Freq: Four times a day (QID) | RESPIRATORY_TRACT | Status: DC
  Administered 2017-09-28: 3 mL via RESPIRATORY_TRACT

## 2017-09-28 MED ORDER — SODIUM CHLORIDE 0.9 % IV BOLUS (SEPSIS)
1000.0000 mL | Freq: Once | INTRAVENOUS | Status: AC
  Administered 2017-09-28: 1000 mL via INTRAVENOUS

## 2017-09-28 MED ORDER — OCTREOTIDE LOAD VIA INFUSION
50.0000 ug | Freq: Once | INTRAVENOUS | Status: AC
  Administered 2017-09-28: 50 ug via INTRAVENOUS
  Filled 2017-09-28: qty 25

## 2017-09-28 MED ORDER — SODIUM BICARBONATE 8.4 % IV SOLN
100.0000 meq | Freq: Once | INTRAVENOUS | Status: AC
  Administered 2017-09-28: 100 meq via INTRAVENOUS

## 2017-09-28 MED ORDER — ONDANSETRON HCL 4 MG/2ML IJ SOLN: 4.0000 mg | Freq: Once | INTRAMUSCULAR | Status: DC

## 2017-09-28 MED ORDER — SODIUM CHLORIDE 0.9 % IV SOLN: 250.0000 mL | INTRAVENOUS | Status: DC | PRN

## 2017-09-28 MED ORDER — FENTANYL CITRATE (PF) 100 MCG/2ML IJ SOLN
INTRAMUSCULAR | Status: AC
  Filled 2017-09-28: qty 2

## 2017-09-28 MED ORDER — CHLORHEXIDINE GLUCONATE 0.12 % MT SOLN
15.0000 mL | Freq: Two times a day (BID) | OROMUCOSAL | Status: DC
  Administered 2017-09-28 – 2017-10-04 (×11): 15 mL via OROMUCOSAL
  Filled 2017-09-28 (×7): qty 15

## 2017-09-28 MED ORDER — MORPHINE SULFATE (PF) 4 MG/ML IV SOLN: 4.0000 mg | Freq: Once | INTRAVENOUS | Status: DC

## 2017-09-28 MED ORDER — FENTANYL CITRATE (PF) 100 MCG/2ML IJ SOLN
50.0000 ug | INTRAMUSCULAR | Status: DC | PRN
  Administered 2017-09-28 – 2017-09-30 (×10): 50 ug via INTRAVENOUS
  Filled 2017-09-28 (×10): qty 2

## 2017-09-28 MED ORDER — FENTANYL CITRATE (PF) 100 MCG/2ML IJ SOLN
INTRAMUSCULAR | Status: DC | PRN
  Administered 2017-09-28: 25 ug via INTRAVENOUS

## 2017-09-28 MED ORDER — LIDOCAINE HCL 2 % IJ SOLN: 20.0000 mL | Freq: Once | INTRAMUSCULAR | Status: DC

## 2017-09-28 MED ORDER — THIAMINE HCL 100 MG/ML IJ SOLN
100.0000 mg | Freq: Every day | INTRAMUSCULAR | Status: DC
  Administered 2017-09-28 – 2017-10-01 (×4): 100 mg via INTRAVENOUS
  Filled 2017-09-28 (×3): qty 2
  Filled 2017-09-28: qty 1

## 2017-09-28 MED ORDER — HYDROCODONE-ACETAMINOPHEN 7.5-325 MG PO TABS: 1.0000 | ORAL_TABLET | ORAL | Status: DC | PRN

## 2017-09-28 MED ORDER — BUTAMBEN-TETRACAINE-BENZOCAINE 2-2-14 % EX AERO
INHALATION_SPRAY | CUTANEOUS | Status: DC | PRN
  Administered 2017-09-28: 2 via TOPICAL

## 2017-09-28 MED ORDER — LIDOCAINE HCL (PF) 1 % IJ SOLN
INTRAMUSCULAR | Status: AC
  Administered 2017-09-28: 07:00:00
  Filled 2017-09-28: qty 5

## 2017-09-28 MED ORDER — VITAMIN K1 10 MG/ML IJ SOLN
10.0000 mg | Freq: Once | INTRAVENOUS | Status: AC
  Administered 2017-09-28: 10 mg via INTRAVENOUS
  Filled 2017-09-28: qty 1

## 2017-09-28 MED ORDER — PANTOPRAZOLE SODIUM 40 MG IV SOLR
40.0000 mg | Freq: Two times a day (BID) | INTRAVENOUS | Status: DC
  Administered 2017-10-01 – 2017-10-03 (×3): 40 mg via INTRAVENOUS
  Filled 2017-09-28 (×3): qty 40

## 2017-09-28 MED ORDER — EPINEPHRINE PF 1 MG/10ML IJ SOSY
PREFILLED_SYRINGE | INTRAMUSCULAR | Status: AC
  Filled 2017-09-28: qty 10

## 2017-09-28 MED ORDER — DIPHENHYDRAMINE HCL 50 MG/ML IJ SOLN
INTRAMUSCULAR | Status: AC
  Filled 2017-09-28: qty 1

## 2017-09-28 MED ORDER — ALBUTEROL SULFATE (2.5 MG/3ML) 0.083% IN NEBU: 2.5000 mg | INHALATION_SOLUTION | RESPIRATORY_TRACT | Status: DC | PRN

## 2017-09-28 MED ORDER — LACTATED RINGERS IV SOLN
INTRAVENOUS | Status: DC
  Administered 2017-09-28: 20:00:00 via INTRAVENOUS

## 2017-09-28 MED ORDER — MIDAZOLAM HCL 2 MG/2ML IJ SOLN: 1.0000 mg | Freq: Once | INTRAMUSCULAR | Status: DC

## 2017-09-28 MED ORDER — ORAL CARE MOUTH RINSE
15.0000 mL | Freq: Two times a day (BID) | OROMUCOSAL | Status: DC
  Administered 2017-09-28 – 2017-10-04 (×8): 15 mL via OROMUCOSAL

## 2017-09-28 MED ORDER — MIDAZOLAM HCL 5 MG/ML IJ SOLN
INTRAMUSCULAR | Status: AC
  Filled 2017-09-28: qty 2

## 2017-09-28 NOTE — ED Notes (Signed)
Pt given versed prior to initiation of levephed, BP down into 70's

## 2017-09-28 NOTE — ED Notes (Signed)
Bolus of octreotide administered, patient noted to become SB @ 48, asymptomatic, infusion paused and MD Fayrene FearingJames notified. Within 2-3 minutes patient HR back up to 70, NSR. Advised to lower infusion hourly rate to 12.5/hr, will continue to monitor.
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
## 2017-09-28 NOTE — ED Notes (Signed)
Dr.James placing femoral line; three unsuccessful IV attempts

## 2017-09-28 NOTE — Interval H&P Note (Signed)
History and Physical Interval Note:  09/28/2017 10:02 AM  Michael [name]  has presented today for surgery, with the diagnosis of GI bleed; Hematemesis  The various methods of treatment have been discussed with the patient and family. After consideration of risks, benefits and other options for treatment, the patient has consented to  Procedure(s): ESOPHAGOGASTRODUODENOSCOPY (EGD) (N/A) as a surgical intervention .  The patient's history has been reviewed, patient examined, no change in status, stable for surgery.  I have reviewed the patient's chart and labs.  Questions were answered to the patient's satisfaction.     Mikylah Ackroyd C.

## 2017-09-28 NOTE — ED Triage Notes (Signed)
Pt was at home tonight, a friend heard pt fall. Pt found lying in large amounts of dark red emesis and stool. Hx of tear in esophagus. EMS reports sitting pt up and he became unresponsive. No radials present on arrival and is lethargic but answering questions appropriately

## 2017-09-28 NOTE — ED Notes (Signed)
Pt gave verbal consent to Dr.James for emergent blood transfusion
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
## 2017-09-28 NOTE — ED Notes (Addendum)
MD james notified of BP 88/58, 500 cc bolus NS ordered. Endo at bedside preparing for endoscopy.

## 2017-09-28 NOTE — Op Note (Signed)
[facility] Patient Name: Michael RhymesDonald Sauve Procedure Date : 09/28/2017 MRN: [id number] Attending MD: Shirley FriarVincent C Leoni Goodness , MD Date of Birth: 07/15/1966 CSN: [id number] Age: [age] Admit Type: Outpatient Procedure:                Upper GI endoscopy Indications:              Active gastrointestinal bleeding, Hematemesis,                            Melena, Esophageal varices Providers:                Shirley FriarVincent C. Azrael Huss, MD, Dwain SarnaPatricia Ford, RN,                            Kandice RobinsonsGuillaume Awaka, Technician Referring MD:              Medicines:                Fentanyl 25 micrograms IV, Midazolam 4 mg IV,                            Cetacaine spray, HYQ:MVHQIOEpi:saline 1:10,000 U (2 cc                            injected submucosally); Levophed drip per CCM Complications:            No immediate complications. Estimated Blood Loss:     Large amount of blood noted in stomach as stated                            above. No active bleeding seen from arterial                            bleeding spot after hemoclip placement and no                            active bleeding from esophageal varices after                            banding. Unable to visualize gastric mucosa for                            other potential sources of bleeding. Procedure:                Pre-Anesthesia Assessment:                           - Prior to the procedure, a History and Physical                            was performed, and patient medications and                            allergies were reviewed. The patient's tolerance of  previous anesthesia was also reviewed. The risks                            and benefits of the procedure and the sedation                            options and risks were discussed with the patient.                            All questions were answered, and informed consent                            was obtained. Prior Anticoagulants: The patient has                             taken no previous anticoagulant or antiplatelet                            agents. ASA Grade Assessment: IV - A patient with                            severe systemic disease that is a constant threat                            to life. After reviewing the risks and benefits,                            the patient was deemed in satisfactory condition to                            undergo the procedure.                           After obtaining informed consent, the endoscope was                            passed under direct vision. Throughout the                            procedure, the patient's blood pressure, pulse, and                            oxygen saturations were monitored continuously. The                            [id number] ([id number]) scope was introduced through the                            mouth, and advanced to the second part of duodenum.                            The upper GI endoscopy was performed with  difficulty due to excessive bleeding, the patient's                            discomfort during the procedure and the patient's                            cardiovascular instability (hypotension).                            Successful completion of the procedure was aided by                            straightening and shortening the scope to obtain                            bowel loop reduction, controlling the bleeding and                            managing the patient's medical instability. The                            patient tolerated the procedure. Scope In: Scope Out: Findings:      Grade III, large (> 5 mm) varices were found in the lower third of the       esophagus and at the gastroesophageal junction. Two bands were       successfully placed with complete eradication, resulting in deflation of       varices. Bleeding from GEJ had stopped at the end of the procedure.       Bleeding stigmata noted on the distal  varices.      A Dieulafoy lesion with spurting bleeding and stigmata of recent       bleeding was found at the gastroesophageal junction. To stop active       bleeding, one hemostatic clip was successfully placed. Bleeding had       stopped at the end of the procedure. Area was unsuccessfully injected       with 2 mL of a 1:10,000 solution of epinephrine for hemostasis.       Estimated blood loss was minimal.      Massive amount of red blood and red clots was found in the entire       examined stomach preventing visualizaiton of most of the stomach.      Red blood was found in the second portion of the duodenum.      The exam of the duodenum was otherwise normal.      Retroflexion in stomach not done due to large amount of blood present       and active bleeding at GEJ.      The Z-line was found 40 cm from the incisors. Impression:               - Grade III and large (> 5 mm) esophageal varices.                            Completely eradicated. Banded.                           -  Dieulafoy lesion of stomach.                           - Red blood in the entire stomach.                           - Blood in the second portion of the duodenum.                           - Z-line, 40 cm from the incisors.                           - No specimens collected. Moderate Sedation:      Moderate (conscious) sedation was administered by the endoscopy nurse       and supervised by the endoscopist. The following parameters were       monitored: oxygen saturation, heart rate, blood pressure, and response       to care. Recommendation:           - Observe patient's clinical course.                           - Recommend intubation for airway protection due to                            large amount of blood noted in stomach. D/W CCM                            doctor. Continue Octreotide and Protonix drips.                            Pressors started prior to the EGD due to                             hypotension and stopped during the procedure due to                            elevated BP.                           - NPO.                           - Post procedure medication orders were given. Procedure Code(s):        --- Professional ---                           [phone], Esophagogastroduodenoscopy, flexible,                            transoral; with band ligation of esophageal/gastric                            varices Diagnosis Code(s):        --- Professional ---  K92.0, Hematemesis                           K31.82, Dieulafoy lesion (hemorrhagic) of stomach                            and duodenum                           I85.00, Esophageal varices without bleeding                           K92.2, Gastrointestinal hemorrhage, unspecified                           K92.1, Melena (includes Hematochezia) CPT copyright 2016 American Medical Association. All rights reserved. The codes documented in this report are preliminary and upon coder review may  be revised to meet current compliance requirements. Shirley Friar, MD 09/28/2017 11:34:34 AM This report has been signed electronically. Number of Addenda: 0

## 2017-09-28 NOTE — ED Notes (Signed)
Spoke with MD Wallace CullensGray regarding hypotension. Advised to give a full liter instead of 500 cc.
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
## 2017-09-29 DIAGNOSIS — D689 Coagulation defect, unspecified: Secondary | ICD-10-CM

## 2017-09-29 DIAGNOSIS — F1010 Alcohol abuse, uncomplicated: Secondary | ICD-10-CM

## 2017-09-29 LAB — PREPARE FRESH FROZEN PLASMA
UNIT DIVISION: 0
Unit division: 0
Unit division: 0
Unit division: 0

## 2017-09-29 LAB — BPAM FFP
BLOOD PRODUCT EXPIRATION DATE: 201811072359
Blood Product Expiration Date: 201811042359
Blood Product Expiration Date: 201811082359
Blood Product Expiration Date: 201811142359
ISSUE DATE / TIME: 201811030659
ISSUE DATE / TIME: 201811030659
ISSUE DATE / TIME: 201811030727
ISSUE DATE / TIME: 201811030727
UNIT TYPE AND RH: 6200
UNIT TYPE AND RH: 6200
Unit Type and Rh: 600
Unit Type and Rh: 6200

## 2017-09-29 LAB — BASIC METABOLIC PANEL
ANION GAP: 7 (ref 5–15)
BUN: 33 mg/dL — ABNORMAL HIGH (ref 6–20)
CALCIUM: 8 mg/dL — AB (ref 8.9–10.3)
CO2: 16 mmol/L — ABNORMAL LOW (ref 22–32)
Chloride: 117 mmol/L — ABNORMAL HIGH (ref 101–111)
Creatinine, Ser: 1.5 mg/dL — ABNORMAL HIGH (ref 0.61–1.24)
GFR, EST NON AFRICAN AMERICAN: 53 mL/min — AB (ref >60)
Glucose, Bld: 112 mg/dL — ABNORMAL HIGH (ref 65–99)
Potassium: 4.9 mmol/L (ref 3.5–5.1)
SODIUM: 140 mmol/L (ref 135–145)

## 2017-09-29 LAB — CBC
HCT: 18 % — ABNORMAL LOW (ref 39.0–52.0)
HCT: 20 % — ABNORMAL LOW (ref 39.0–52.0)
HCT: 22.7 % — ABNORMAL LOW (ref 39.0–52.0)
Hemoglobin: 6.3 g/dL — CL (ref 13.0–17.0)
Hemoglobin: 7 g/dL — ABNORMAL LOW (ref 13.0–17.0)
Hemoglobin: 7.8 g/dL — ABNORMAL LOW (ref 13.0–17.0)
MCH: 30.9 pg (ref 26.0–34.0)
MCH: 30.5 pg (ref 26.0–34.0)
MCH: 30.4 pg (ref 26.0–34.0)
MCHC: 35 g/dL (ref 30.0–36.0)
MCHC: 34.4 g/dL (ref 30.0–36.0)
MCHC: 35 g/dL (ref 30.0–36.0)
MCV: 88.2 fL (ref 78.0–100.0)
MCV: 88.7 fL (ref 78.0–100.0)
MCV: 87 fL (ref 78.0–100.0)
PLATELETS: 108 10*3/uL — AB (ref 150–400)
PLATELETS: 140 10*3/uL — AB (ref 150–400)
PLATELETS: 112 10*3/uL — AB (ref 150–400)
RBC: 2.04 MIL/uL — ABNORMAL LOW (ref 4.22–5.81)
RBC: 2.3 MIL/uL — ABNORMAL LOW (ref 4.22–5.81)
RBC: 2.56 MIL/uL — ABNORMAL LOW (ref 4.22–5.81)
RDW: 15.7 % — AB (ref 11.5–15.5)
RDW: 15.8 % — AB (ref 11.5–15.5)
RDW: 16.2 % — AB (ref 11.5–15.5)
WBC: 14.6 10*3/uL — ABNORMAL HIGH (ref 4.0–10.5)
WBC: 8.1 10*3/uL (ref 4.0–10.5)
WBC: 9.6 10*3/uL (ref 4.0–10.5)

## 2017-09-29 LAB — GLUCOSE, CAPILLARY
GLUCOSE-CAPILLARY: 108 mg/dL — AB (ref 65–99)
Glucose-Capillary: 85 mg/dL (ref 65–99)

## 2017-09-29 LAB — MAGNESIUM: Magnesium: 1.3 mg/dL — ABNORMAL LOW (ref 1.7–2.4)

## 2017-09-29 LAB — PREPARE RBC (CROSSMATCH)

## 2017-09-29 LAB — PHOSPHORUS: PHOSPHORUS: 2.8 mg/dL (ref 2.5–4.6)

## 2017-09-29 MED ORDER — THIAMINE HCL 100 MG/ML IJ SOLN
Freq: Once | INTRAVENOUS | Status: AC
  Administered 2017-09-29: 17:00:00 via INTRAVENOUS
  Filled 2017-09-29 (×2): qty 1000

## 2017-09-29 MED ORDER — SODIUM CHLORIDE 0.9 % IV SOLN
Freq: Once | INTRAVENOUS | Status: AC
  Administered 2017-09-29: 250 mL via INTRAVENOUS

## 2017-09-29 MED ORDER — ONDANSETRON HCL 4 MG/2ML IJ SOLN
4.0000 mg | Freq: Four times a day (QID) | INTRAMUSCULAR | Status: DC | PRN
  Administered 2017-09-29 – 2017-09-30 (×3): 4 mg via INTRAVENOUS
  Filled 2017-09-29 (×3): qty 2

## 2017-09-29 MED ORDER — SODIUM CHLORIDE 0.9 % IV SOLN
6.0000 g | Freq: Once | INTRAVENOUS | Status: AC
  Administered 2017-09-29: 6 g via INTRAVENOUS
  Filled 2017-09-29 (×2): qty 12

## 2017-09-29 MED ORDER — SODIUM CHLORIDE 0.9 % IV SOLN
INTRAVENOUS | Status: DC
  Administered 2017-09-29: 125 mL/h via INTRAVENOUS
  Administered 2017-10-02 (×2): via INTRAVENOUS

## 2017-09-29 MED ORDER — SODIUM CHLORIDE 0.9 % IV BOLUS (SEPSIS)
1000.0000 mL | Freq: Once | INTRAVENOUS | Status: AC
  Administered 2017-09-29: 250 mL via INTRAVENOUS

## 2017-09-29 MED ORDER — LORAZEPAM 2 MG/ML IJ SOLN
1.0000 mg | INTRAMUSCULAR | Status: DC | PRN
  Administered 2017-09-29 – 2017-10-01 (×6): 1 mg via INTRAVENOUS
  Filled 2017-09-29 (×6): qty 1

## 2017-09-29 MED FILL — Medication: Qty: 1 | Status: AC

## 2017-09-29 NOTE — Progress Notes (Signed)
eLink Physician-Brief Progress Note Patient Name: Michael RhymesDonald Leckey DOB: 03/20/1966 MRN: [id number]   Date of Service  09/29/2017  HPI/Events of Note  Multiple issues: 1. BP = 89/69 and 2. K+ = 5.5 earlier today.   eICU Interventions  Will order: 1. Bolus with 0.9 NaCl 1 liter IV over 1 hour now.  2. Change LR at 125 mL/hour to 0.9 NaCl at 125 mL/hour.      Intervention Category Major Interventions: Hypotension - evaluation and management  Sommer,Steven Eugene 09/29/2017, 1:37 AM

## 2017-09-29 NOTE — Progress Notes (Signed)
[facility] FOR [facility] LAB REPLACEMENT ONLY  The patient does apply for the [facility] Protocol based on the criteria listed below:   1. Is GFR >/= 40 ml/min? Yes.    Patient's GFR today is >60 2. Is urine output >/= 0.5 ml/kg/hr for the last 6 hours? Yes.   Patient's UOP is 0.5 ml/kg/hr 3. Is BUN < 60 mg/dL? Yes.    Patient's BUN today is 33 4. Abnormal electrolyte(s): Mg 1.3 5. Ordered repletion with: protocol 6. If a panic level lab has been reported, has the CCM MD in charge been notified? No..   Physician:    Markus DaftWHELAN, Michael Villafranca A 09/29/2017 6:42 AM
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
## 2017-09-29 NOTE — Progress Notes (Signed)
eLink Physician-Brief Progress Note Patient Name: Michael RhymesDonald [name] DOB: 10/03/1966 MRN: [id number]   Date of Service  09/29/2017  HPI/Events of Note  Hgb = 6.3.  eICU Interventions  Will order: 1. Transfuse 1 unit PRBC.     Intervention Category Major Interventions: Other:  Lenell AntuSommer,Steven Eugene 09/29/2017, 6:56 AM

## 2017-09-29 NOTE — Progress Notes (Addendum)
[facility] Progress Note  Michael RhymesDonald Leandro 51 y.o. 05/31/1966   Subjective: Lying in bed complaining of epigastric pain. No BMs, rectal bleeding or hematemesis overnight.   Objective: Vital signs: Vitals:   09/29/17 0900 09/29/17 0915  BP: 118/64 118/64  Pulse: 100 93  Resp: (!) 27 19  Temp:  98 F (36.7 C)  SpO2: 100% 100%    Physical Exam: Gen: lethargic, obese, no acute distress  HEENT: anicteric sclera CV: RRR Chest: CTA B Abd: epigastric tenderness with minimal guarding, soft, nondistended, +BS Ext: no edema  Lab Results: Recent Labs    09/28/17 1249 09/29/17 0545  NA 137 140  K 5.5* 4.9  CL 115* 117*  CO2 13* 16*  GLUCOSE 96 112*  BUN 41* 33*  CREATININE 2.39* 1.50*  CALCIUM 8.4* 8.0*  MG  --  1.3*  PHOS  --  2.8   Recent Labs    09/28/17 0710  AST 29  ALT 12*  ALKPHOS 78  BILITOT 1.1  PROT 5.7*  ALBUMIN 2.5*   Recent Labs    09/28/17 0710 09/28/17 1249  09/29/17 0015 09/29/17 0545  WBC 20.9* 13.9*   < > 14.6* 9.6  NEUTROABS 15.5* 9.4*  --   --   --   HGB 6.9* 9.6*   < > 7.0* 6.3*  HCT 20.4* 27.7*   < > 20.0* 18.0*  MCV 92.7 89.6   < > 87.0 88.2  PLT 238 121*   < > 140* 112*   < > = values in this interval not displayed.      Assessment/Plan: Alcoholic cirrhosis - S/P upper GI bleed with bleeding Dieulafoy lesion (s/p ZOX:WRUEAVepi:saline injection and hemoclipping X 1) and large esophageal varices with bleeding stigmata (s/p banding X 2). Epigastric pain likely due to banding. Hgb 6.3. Agree with blood transfusion. Continue Octreotide and Protonix drip. Keep NPO today and if stable then change to clear liquids tomorrow. [facility] (Dr. Dulce Sellarutlaw) will f/u tomorrow.   Dagan Heinz C. 09/29/2017, 10:08 AM  Pager [phone]  AFTER 5 PM or on weekends please call [phone] ID: Michael Rhymesonald Weed, male   DOB: 12/02/1965, 51 y.o.   MRN: [id number]

## 2017-09-30 ENCOUNTER — Encounter (HOSPITAL_COMMUNITY): Payer: Self-pay | Admitting: Gastroenterology

## 2017-09-30 DIAGNOSIS — R571 Hypovolemic shock: Secondary | ICD-10-CM

## 2017-09-30 DIAGNOSIS — K7031 Alcoholic cirrhosis of liver with ascites: Secondary | ICD-10-CM

## 2017-09-30 LAB — BASIC METABOLIC PANEL
Anion gap: 6 (ref 5–15)
BUN: 23 mg/dL — AB (ref 6–20)
CALCIUM: 8.1 mg/dL — AB (ref 8.9–10.3)
CO2: 18 mmol/L — ABNORMAL LOW (ref 22–32)
CREATININE: 1.14 mg/dL (ref 0.61–1.24)
Chloride: 115 mmol/L — ABNORMAL HIGH (ref 101–111)
GFR calc Af Amer: >60 mL/min (ref >60)
GLUCOSE: 98 mg/dL (ref 65–99)
Potassium: 4.2 mmol/L (ref 3.5–5.1)
Sodium: 139 mmol/L (ref 135–145)

## 2017-09-30 LAB — TYPE AND SCREEN
ABO/RH(D): O POS
ANTIBODY SCREEN: NEGATIVE
UNIT DIVISION: 0
UNIT DIVISION: 0
UNIT DIVISION: 0
Unit division: 0
Unit division: 0
Unit division: 0

## 2017-09-30 LAB — CBC
HCT: 22.2 % — ABNORMAL LOW (ref 39.0–52.0)
HCT: 23.9 % — ABNORMAL LOW (ref 39.0–52.0)
HEMATOCRIT: 24 % — AB (ref 39.0–52.0)
HEMATOCRIT: 23.9 % — AB (ref 39.0–52.0)
Hemoglobin: 8.2 g/dL — ABNORMAL LOW (ref 13.0–17.0)
Hemoglobin: 7.8 g/dL — ABNORMAL LOW (ref 13.0–17.0)
Hemoglobin: 8.2 g/dL — ABNORMAL LOW (ref 13.0–17.0)
Hemoglobin: 8.2 g/dL — ABNORMAL LOW (ref 13.0–17.0)
MCH: 30.4 pg (ref 26.0–34.0)
MCH: 30.7 pg (ref 26.0–34.0)
MCH: 31.2 pg (ref 26.0–34.0)
MCH: 30.7 pg (ref 26.0–34.0)
MCHC: 35.1 g/dL (ref 30.0–36.0)
MCHC: 34.3 g/dL (ref 30.0–36.0)
MCHC: 34.2 g/dL (ref 30.0–36.0)
MCHC: 34.3 g/dL (ref 30.0–36.0)
MCV: 88.9 fL (ref 78.0–100.0)
MCV: 89.5 fL (ref 78.0–100.0)
MCV: 88.8 fL (ref 78.0–100.0)
MCV: 89.5 fL (ref 78.0–100.0)
PLATELETS: 122 10*3/uL — AB (ref 150–400)
PLATELETS: 123 10*3/uL — AB (ref 150–400)
PLATELETS: 115 10*3/uL — AB (ref 150–400)
Platelets: 117 10*3/uL — ABNORMAL LOW (ref 150–400)
RBC: 2.7 MIL/uL — ABNORMAL LOW (ref 4.22–5.81)
RBC: 2.5 MIL/uL — ABNORMAL LOW (ref 4.22–5.81)
RBC: 2.67 MIL/uL — ABNORMAL LOW (ref 4.22–5.81)
RBC: 2.67 MIL/uL — ABNORMAL LOW (ref 4.22–5.81)
RDW: 15.8 % — AB (ref 11.5–15.5)
RDW: 15.7 % — AB (ref 11.5–15.5)
RDW: 16.1 % — AB (ref 11.5–15.5)
RDW: 16 % — AB (ref 11.5–15.5)
WBC: 9.5 10*3/uL (ref 4.0–10.5)
WBC: 7.4 10*3/uL (ref 4.0–10.5)
WBC: 6.7 10*3/uL (ref 4.0–10.5)
WBC: 7.6 10*3/uL (ref 4.0–10.5)

## 2017-09-30 LAB — BPAM RBC
BLOOD PRODUCT EXPIRATION DATE: 201811052359
BLOOD PRODUCT EXPIRATION DATE: 201811072359
BLOOD PRODUCT EXPIRATION DATE: 201811262359
BLOOD PRODUCT EXPIRATION DATE: 201811272359
Blood Product Expiration Date: 201811072359
Blood Product Expiration Date: 201811142359
ISSUE DATE / TIME: 201811030723
ISSUE DATE / TIME: 201811040830
ISSUE DATE / TIME: 201811030658
ISSUE DATE / TIME: 201811030658
ISSUE DATE / TIME: 201811030723
ISSUE DATE / TIME: 201811040830
UNIT TYPE AND RH: 5100
UNIT TYPE AND RH: 9500
UNIT TYPE AND RH: 9500
Unit Type and Rh: 5100
Unit Type and Rh: 9500
Unit Type and Rh: 9500

## 2017-09-30 LAB — PROTIME-INR
INR: 1.45
Prothrombin Time: 17.6 seconds — ABNORMAL HIGH (ref 11.4–15.2)

## 2017-09-30 LAB — MAGNESIUM
MAGNESIUM: 2.2 mg/dL (ref 1.7–2.4)
Magnesium: 2 mg/dL (ref 1.7–2.4)

## 2017-09-30 LAB — PHOSPHORUS: Phosphorus: 3 mg/dL (ref 2.5–4.6)

## 2017-09-30 LAB — BLOOD PRODUCT ORDER (VERBAL) VERIFICATION

## 2017-09-30 MED ORDER — FOLIC ACID 5 MG/ML IJ SOLN
1.0000 mg | Freq: Every day | INTRAMUSCULAR | Status: DC
  Administered 2017-09-30 – 2017-10-01 (×2): 1 mg via INTRAVENOUS
  Filled 2017-09-30 (×3): qty 0.2

## 2017-09-30 MED ORDER — FENTANYL CITRATE (PF) 100 MCG/2ML IJ SOLN
50.0000 ug | INTRAMUSCULAR | Status: DC | PRN
  Administered 2017-09-30 – 2017-10-01 (×8): 50 ug via INTRAVENOUS
  Filled 2017-09-30 (×8): qty 2

## 2017-09-30 MED ORDER — THIAMINE HCL 100 MG/ML IJ SOLN: Freq: Once | INTRAVENOUS | Status: DC

## 2017-09-30 NOTE — Progress Notes (Signed)
[place] TEAM 1 - Stepdown/ICU TEAM  Michael [name]  UEA:[id number] DOB: Nov 27, 1965 DOA: 09/28/2017 PCP: Patient, No Pcp Per    Brief Narrative:  51 year old drinker with a prior history of transaminitis and a Mallory-Weiss tear who's neighbor called EMS after hearing him fall. Patient had no recollection of the event. He was found in a puddle of dark blood and when EMS set him up he became unresponsive. Blood pressure in the field was extremely low.  Significant Events: 11/3 admit  11/3 EGD w/ banding and lesion injection   Subjective: The pt is sleeping soundly at the time of my visit.  He awakens to voice to deny any complaints, but then goes back to sleep.  He is in no resp distress and there is no evidence of uncontrolled pain.    Assessment & Plan:  GI Bleed bleeding Dieulafoy lesion s/p epi injection and hemoclipping X 1 and large esophageal varices with bleeding stigmata s/p banding X 2 - GI following - octreotide and protonix gtt continue - appears to have corrected the issue for now   Acute blood loss anemia Due to above - Hgb holding steady for now   Recent Labs  Lab 09/29/17 0015 09/29/17 0545 09/29/17 1542 09/30/17 0001 09/30/17 0513  HGB 7.0* 6.3* 7.8* 8.2* 7.8*    Alcoholic cirrhosis   Coagulopathy due to liver disease  Hypomag Corrected w/ replacement   Metabolic acidosis Improving - suspect due to hypoperfusion and acute renal injury - follow trend     Alcohol abuse Pt to be counseled at length on the absolute need to abstain from EtOH when more alert   DVT prophylaxis: SCDs Code Status: FULL CODE Family Communication: no family present at time of exam  Disposition Plan: SDU  Consultants:  [facility]   Antimicrobials:  Azithromycin 11/3 >  Objective: Blood pressure (!) 143/71, pulse 85, temperature 98.6 F (37 C), temperature source Oral, resp. rate 16, height 5\' 11"  (1.803 m), weight 92.5 kg (203 lb 14.8 oz), SpO2 96 %.  Intake/Output  Summary (Last 24 hours) at 09/30/2017 0940 Last data filed at 09/30/2017 0700 Gross per 24 hour  Intake 2348.61 ml  Output 1575 ml  Net 773.61 ml   Filed Weights   09/28/17 1100  Weight: 92.5 kg (203 lb 14.8 oz)    Examination: General: No acute respiratory distress Lungs: Clear to auscultation bilaterally without wheezes or crackles Cardiovascular: Regular rate and rhythm without murmur gallop or rub normal S1 and S2 Abdomen: Nontender, nondistended, soft, bowel sounds positive, no rebound, no ascites, no appreciable mass Extremities: No significant cyanosis, clubbing, or edema bilateral lower extremities  CBC: Recent Labs  Lab 09/28/17 0710 09/28/17 1249  09/29/17 0015 09/29/17 0545 09/29/17 1542 09/30/17 0001 09/30/17 0513  WBC 20.9* 13.9*   < > 14.6* 9.6 8.1 9.5 7.4  NEUTROABS 15.5* 9.4*  --   --   --   --   --   --   HGB 6.9* 9.6*   < > 7.0* 6.3* 7.8* 8.2* 7.8*  HCT 20.4* 27.7*   < > 20.0* 18.0* 22.7* 24.0* 22.2*  MCV 92.7 89.6   < > 87.0 88.2 88.7 88.9 88.8  PLT 238 121*   < > 140* 112* 108* 123* 115*   < > = values in this interval not displayed.   Basic Metabolic Panel: Recent Labs  Lab 09/28/17 0710 09/28/17 1249 09/29/17 0545 09/30/17 0001 09/30/17 0513  NA 132* 137 140  --  139  K 7.0* 5.5* 4.9  --  4.2  CL 110 115* 117*  --  115*  CO2 13* 13* 16*  --  18*  GLUCOSE 147* 96 112*  --  98  BUN 47* 41* 33*  --  23*  CREATININE 3.00* 2.39* 1.50*  --  1.14  CALCIUM 8.2* 8.4* 8.0*  --  8.1*  MG  --   --  1.3* 2.2 2.0  PHOS  --   --  2.8  --  3.0   GFR: Estimated Creatinine Clearance: 90.1 mL/min (by C-G formula based on SCr of 1.14 mg/dL).  Liver Function Tests: Recent Labs  Lab 09/28/17 0710  AST 29  ALT 12*  ALKPHOS 78  BILITOT 1.1  PROT 5.7*  ALBUMIN 2.5*    Recent Labs  Lab 09/28/17 0710  AMMONIA 216*    Coagulation Profile: Recent Labs  Lab 09/28/17 0710 09/28/17 1338 09/30/17 0513  INR 1.89 1.84 1.45    HbA1C: Hgb A1c MFr  Bld  Date/Time Value Ref Range Status  12/05/2016 03:20 PM 5.2 4.8 - 5.6 % Final    Comment:             Pre-diabetes: 5.7 - 6.4          Diabetes: >6.4          Glycemic control for adults with diabetes: <7.0     CBG: Recent Labs  Lab 09/28/17 0737 09/28/17 1233 09/28/17 1937 09/29/17 0024 09/29/17 0352  GLUCAP 126* 132* 99 85 108*    Recent Results (from the past 240 hour(s))  MRSA PCR Screening     Status: None   Collection Time: 09/28/17 10:56 AM  Result Value Ref Range Status   MRSA by PCR NEGATIVE NEGATIVE Final    Comment:        The GeneXpert MRSA Assay (FDA approved for NASAL specimens only), is one component of a comprehensive MRSA colonization surveillance program. It is not intended to diagnose MRSA infection nor to guide or monitor treatment for MRSA infections.      Scheduled Meds: . chlorhexidine  15 mL Mouth Rinse BID  . mouth rinse  15 mL Mouth Rinse q12n4p  . ondansetron (ZOFRAN) IV  4 mg Intravenous Once  . [START ON 10/01/2017] pantoprazole  40 mg Intravenous Q12H  . thiamine injection  100 mg Intravenous Daily     LOS: 2 days   Lonia BloodJeffrey T. Malay Fantroy, MD [facility] Office  [phone] Pager - Text Page per Loretha StaplerAmion as per below:  On-Call/Text Page:      [email or web address]      password [email or web address]  If 7PM-7AM, please contact night-coverage [email or web address] Password [email or web address] 09/30/2017, 9:40 AM

## 2017-09-30 NOTE — Progress Notes (Signed)
No signs of active bleeding. Hemoglobin stable, BUN coming down. Patient on clear liquids.  Chest clear, heart normal, abdomen without mass or tenderness.  Remains on pantoprazole infusion and octreotide infusion, now 48 hours status post acute, exsanguinating upper GI bleed from varices.  Recommendation: No change in therapy at present. May gradually advance diet if continues to do well. Patient will need outpatient follow-up for attention to his liver disease, portal hypertension, consideration of variceal ablation, etc. We will continue to follow with you while he is in-house.  Florencia Reasonsobert V. Juniel Groene, M.D. Pager [phone] If no answer or after 5 PM call [phone]

## 2017-10-01 ENCOUNTER — Other Ambulatory Visit: Payer: Self-pay

## 2017-10-01 DIAGNOSIS — L8990 Pressure ulcer of unspecified site, unspecified stage: Secondary | ICD-10-CM | POA: Diagnosis present

## 2017-10-01 LAB — CBC
HCT: 23.7 % — ABNORMAL LOW (ref 39.0–52.0)
HEMOGLOBIN: 8.1 g/dL — AB (ref 13.0–17.0)
MCH: 30.7 pg (ref 26.0–34.0)
MCHC: 34.2 g/dL (ref 30.0–36.0)
MCV: 89.8 fL (ref 78.0–100.0)
PLATELETS: 122 10*3/uL — AB (ref 150–400)
RBC: 2.64 MIL/uL — AB (ref 4.22–5.81)
RDW: 15.5 % (ref 11.5–15.5)
WBC: 7 10*3/uL (ref 4.0–10.5)

## 2017-10-01 LAB — COMPREHENSIVE METABOLIC PANEL
ALK PHOS: 81 U/L (ref 38–126)
ALT: 29 U/L (ref 17–63)
AST: 42 U/L — AB (ref 15–41)
Albumin: 2.6 g/dL — ABNORMAL LOW (ref 3.5–5.0)
Anion gap: 4 — ABNORMAL LOW (ref 5–15)
BUN: 13 mg/dL (ref 6–20)
CALCIUM: 8.1 mg/dL — AB (ref 8.9–10.3)
CHLORIDE: 115 mmol/L — AB (ref 101–111)
CO2: 18 mmol/L — AB (ref 22–32)
CREATININE: 1.02 mg/dL (ref 0.61–1.24)
GFR calc Af Amer: >60 mL/min (ref >60)
GFR calc non Af Amer: >60 mL/min (ref >60)
Glucose, Bld: 89 mg/dL (ref 65–99)
Potassium: 3.9 mmol/L (ref 3.5–5.1)
SODIUM: 137 mmol/L (ref 135–145)
Total Bilirubin: 1.6 mg/dL — ABNORMAL HIGH (ref 0.3–1.2)
Total Protein: 5.3 g/dL — ABNORMAL LOW (ref 6.5–8.1)

## 2017-10-01 LAB — PROTIME-INR
INR: 1.47
Prothrombin Time: 17.7 seconds — ABNORMAL HIGH (ref 11.4–15.2)

## 2017-10-01 LAB — AMMONIA: Ammonia: 80 umol/L — ABNORMAL HIGH (ref 9–35)

## 2017-10-01 MED ORDER — ADALIMUMAB 40 MG/0.8ML ~~LOC~~ PSKT
40.0000 mg | PREFILLED_SYRINGE | SUBCUTANEOUS | Status: DC
  Filled 2017-10-01: qty 0.8

## 2017-10-01 MED ORDER — POLYVINYL ALCOHOL 1.4 % OP SOLN
1.0000 [drp] | Freq: Three times a day (TID) | OPHTHALMIC | Status: DC | PRN
  Filled 2017-10-01: qty 15

## 2017-10-01 MED ORDER — DOXYCYCLINE HYCLATE 100 MG PO TABS
100.0000 mg | ORAL_TABLET | Freq: Two times a day (BID) | ORAL | Status: DC
  Administered 2017-10-01 – 2017-10-04 (×6): 100 mg via ORAL
  Filled 2017-10-01 (×6): qty 1

## 2017-10-01 MED ORDER — FOLIC ACID 1 MG PO TABS
1.0000 mg | ORAL_TABLET | Freq: Every day | ORAL | Status: DC
  Administered 2017-10-03 – 2017-10-04 (×2): 1 mg via ORAL
  Filled 2017-10-01 (×2): qty 1

## 2017-10-01 MED ORDER — METHOCARBAMOL 500 MG PO TABS: 500.0000 mg | ORAL_TABLET | Freq: Three times a day (TID) | ORAL | Status: DC | PRN

## 2017-10-01 MED ORDER — FENTANYL CITRATE (PF) 100 MCG/2ML IJ SOLN: 25.0000 ug | INTRAMUSCULAR | Status: DC | PRN

## 2017-10-01 MED ORDER — PROPRANOLOL HCL 10 MG PO TABS
10.0000 mg | ORAL_TABLET | Freq: Two times a day (BID) | ORAL | Status: DC
  Administered 2017-10-01 – 2017-10-04 (×6): 10 mg via ORAL
  Filled 2017-10-01 (×9): qty 1

## 2017-10-01 MED ORDER — TRAZODONE HCL 50 MG PO TABS
100.0000 mg | ORAL_TABLET | Freq: Every day | ORAL | Status: DC
  Administered 2017-10-01 – 2017-10-03 (×3): 100 mg via ORAL
  Filled 2017-10-01 (×3): qty 2

## 2017-10-01 MED ORDER — TRAMADOL HCL 50 MG PO TABS
50.0000 mg | ORAL_TABLET | Freq: Four times a day (QID) | ORAL | Status: DC | PRN
  Administered 2017-10-01 – 2017-10-02 (×3): 50 mg via ORAL
  Filled 2017-10-01 (×3): qty 1

## 2017-10-01 MED ORDER — ISOTRETINOIN 40 MG PO CAPS
40.0000 mg | ORAL_CAPSULE | Freq: Two times a day (BID) | ORAL | Status: DC
  Administered 2017-10-02 – 2017-10-04 (×4): 40 mg via ORAL
  Filled 2017-10-01 (×6): qty 1

## 2017-10-01 MED ORDER — ACETAMINOPHEN 500 MG PO TABS
500.0000 mg | ORAL_TABLET | Freq: Four times a day (QID) | ORAL | Status: DC | PRN
  Administered 2017-10-02 – 2017-10-03 (×2): 500 mg via ORAL
  Filled 2017-10-01 (×2): qty 1

## 2017-10-01 MED ORDER — LORAZEPAM 0.5 MG PO TABS: 0.5000 mg | ORAL_TABLET | Freq: Four times a day (QID) | ORAL | Status: DC | PRN

## 2017-10-01 MED ORDER — HYDROXYZINE HCL 25 MG PO TABS: 25.0000 mg | ORAL_TABLET | Freq: Three times a day (TID) | ORAL | Status: DC | PRN

## 2017-10-01 MED ORDER — HYPROMELLOSE (GONIOSCOPIC) 2.5 % OP SOLN
1.0000 [drp] | Freq: Three times a day (TID) | OPHTHALMIC | Status: DC | PRN
  Filled 2017-10-01: qty 15

## 2017-10-01 MED ORDER — NICOTINE 21 MG/24HR TD PT24
21.0000 mg | MEDICATED_PATCH | Freq: Every day | TRANSDERMAL | Status: DC
  Administered 2017-10-01 – 2017-10-04 (×3): 21 mg via TRANSDERMAL
  Filled 2017-10-01 (×3): qty 1

## 2017-10-01 MED ORDER — POLYETHYL GLYCOL-PROPYL GLYCOL 0.4-0.3 % OP SOLN: 1.0000 [drp] | Freq: Three times a day (TID) | OPHTHALMIC | Status: DC | PRN

## 2017-10-01 MED ORDER — OXYCODONE HCL 5 MG PO TABS
5.0000 mg | ORAL_TABLET | ORAL | Status: DC | PRN
  Administered 2017-10-02 – 2017-10-04 (×6): 10 mg via ORAL
  Filled 2017-10-01 (×6): qty 2

## 2017-10-01 MED ORDER — ZOLPIDEM TARTRATE 5 MG PO TABS
10.0000 mg | ORAL_TABLET | Freq: Once | ORAL | Status: AC
  Administered 2017-10-01: 10 mg via ORAL
  Filled 2017-10-01: qty 2

## 2017-10-01 MED ORDER — LACTULOSE 10 GM/15ML PO SOLN
10.0000 g | Freq: Three times a day (TID) | ORAL | Status: DC
  Administered 2017-10-01 – 2017-10-04 (×9): 10 g via ORAL
  Filled 2017-10-01 (×9): qty 15

## 2017-10-01 MED ORDER — VITAMIN B-1 100 MG PO TABS
100.0000 mg | ORAL_TABLET | Freq: Every day | ORAL | Status: DC
  Administered 2017-10-03 – 2017-10-04 (×2): 100 mg via ORAL
  Filled 2017-10-01 (×2): qty 1

## 2017-10-01 NOTE — Progress Notes (Addendum)
[place] TEAM 1 - Stepdown/ICU TEAM  Michael RhymesDonald [name]  ZOX:[id number] DOB: 11/23/1966 DOA: 09/28/2017 PCP: Michael [name], No Pcp Per    Brief Narrative:  51 year old drinker with a prior history of transaminitis and a Mallory-Weiss tear who's neighbor called EMS after hearing him fall. Michael [name] had no recollection of the event. He was found in a puddle of dark blood and when EMS set him up he became unresponsive. Blood pressure in the field was extremely low.  Significant Events: 11/3 admit  11/3 EGD w/ banding and lesion injection 11/5 [facility] assumed care    Subjective: Much more alert today.  C/o severe pain due to an abscess on his back.  He denies cp, n/v, or abdom pain.    Assessment & Plan:  GI Bleed bleeding Dieulafoy lesion s/p epi injection and hemoclipping X 1 and large esophageal varices with bleeding stigmata s/p banding X 2 - GI following - octreotide to continue for 4 days of tx (ends 11/7 at 08:00) - protonix has now been weaned to Q12 - no evidence of active bleeding at this time   Acute blood loss anemia Due to above - Hgb holding steady for now   Recent Labs  Lab 09/30/17 0001 09/30/17 0513 09/30/17 0917 09/30/17 1535 10/01/17 0304  HGB 8.2* 7.8* 8.2* 8.2* 8.1*    Alcoholic cirrhosis - hepatic encephalopathy  Ammonia remains quite elevated - begin lactulose - follow mental status   Coagulopathy due to liver disease INR stable at ~1.5   Hypomag Corrected w/ replacement   Metabolic acidosis Improving - suspect due to hypoperfusion and acute renal injury - follow trend     Alcohol abuse Pt to be counseled at length on the absolute need to abstain from EtOH when more alert   Hydradenitis Has a moderate sized tense abscess on L shoulder - Gen Surg consulted for I&D - change to Doxycycline for better coverage   DVT prophylaxis: SCDs Code Status: FULL CODE Family Communication: spoke w/ mother at bedside   Disposition Plan: SDU until off octreotide    Consultants:  [facility]   Antimicrobials:  Azithromycin 11/3 > 11/6 Doxycycline 11/6 >  Objective: Blood pressure 122/79, pulse 91, temperature 98.2 F (36.8 C), temperature source Oral, resp. rate 19, height 5\' 11"  (1.803 m), weight 92.1 kg (203 lb 0.7 oz), SpO2 94 %.  Intake/Output Summary (Last 24 hours) at 10/01/2017 1036 Last data filed at 10/01/2017 0800 Gross per 24 hour  Intake -  Output 900 ml  Net -900 ml   Filed Weights   09/28/17 1100 10/01/17 0403  Weight: 92.5 kg (203 lb 14.8 oz) 92.1 kg (203 lb 0.7 oz)    Examination: General: No acute respiratory distress - alert  Lungs: Clear to auscultation bilaterally - no wheezing  Cardiovascular: Regular rate and rhythm without murmur  Abdomen: NT/ND, soft, bs+, no mass  Extremities: No C/C/E B LE   CBC: Recent Labs  Lab 09/28/17 0710 09/28/17 1249  09/30/17 0001 09/30/17 0513 09/30/17 0917 09/30/17 1535 10/01/17 0304  WBC 20.9* 13.9*   < > 9.5 7.4 6.7 7.6 7.0  NEUTROABS 15.5* 9.4*  --   --   --   --   --   --   HGB 6.9* 9.6*   < > 8.2* 7.8* 8.2* 8.2* 8.1*  HCT 20.4* 27.7*   < > 24.0* 22.2* 23.9* 23.9* 23.7*  MCV 92.7 89.6   < > 88.9 88.8 89.5 89.5 89.8  PLT 238 121*   < >  123* 115* 117* 122* 122*   < > = values in this interval not displayed.   Basic Metabolic Panel: Recent Labs  Lab 09/28/17 0710 09/28/17 1249 09/29/17 0545 09/30/17 0001 09/30/17 0513 10/01/17 0304  NA 132* 137 140  --  139 137  K 7.0* 5.5* 4.9  --  4.2 3.9  CL 110 115* 117*  --  115* 115*  CO2 13* 13* 16*  --  18* 18*  GLUCOSE 147* 96 112*  --  98 89  BUN 47* 41* 33*  --  23* 13  CREATININE 3.00* 2.39* 1.50*  --  1.14 1.02  CALCIUM 8.2* 8.4* 8.0*  --  8.1* 8.1*  MG  --   --  1.3* 2.2 2.0  --   PHOS  --   --  2.8  --  3.0  --    GFR: Estimated Creatinine Clearance: 100.5 mL/min (by C-G formula based on SCr of 1.02 mg/dL).  Liver Function Tests: Recent Labs  Lab 09/28/17 0710 10/01/17 0304  AST 29 42*  ALT 12* 29   ALKPHOS 78 81  BILITOT 1.1 1.6*  PROT 5.7* 5.3*  ALBUMIN 2.5* 2.6*    Recent Labs  Lab 09/28/17 0710 10/01/17 0304  AMMONIA 216* 80*    Coagulation Profile: Recent Labs  Lab 09/28/17 0710 09/28/17 1338 09/30/17 0513 10/01/17 0304  INR 1.89 1.84 1.45 1.47    HbA1C: Hgb A1c MFr Bld  Date/Time Value Ref Range Status  12/05/2016 03:20 PM 5.2 4.8 - 5.6 % Final    Comment:             Pre-diabetes: 5.7 - 6.4          Diabetes: >6.4          Glycemic control for adults with diabetes: <7.0     CBG: Recent Labs  Lab 09/28/17 0737 09/28/17 1233 09/28/17 1937 09/29/17 0024 09/29/17 0352  GLUCAP 126* 132* 99 85 108*    Recent Results (from the past 240 hour(s))  MRSA PCR Screening     Status: None   Collection Time: 09/28/17 10:56 AM  Result Value Ref Range Status   MRSA by PCR NEGATIVE NEGATIVE Final    Comment:        The GeneXpert MRSA Assay (FDA approved for NASAL specimens only), is one component of a comprehensive MRSA colonization surveillance program. It is not intended to diagnose MRSA infection nor to guide or monitor treatment for MRSA infections.      Scheduled Meds: . chlorhexidine  15 mL Mouth Rinse BID  . folic acid  1 mg Intravenous Daily  . mouth rinse  15 mL Mouth Rinse q12n4p  . pantoprazole  40 mg Intravenous Q12H  . thiamine injection  100 mg Intravenous Daily     LOS: 3 days   Lonia BloodJeffrey T. Harace Mccluney, MD [facility] Office  [phone] Pager - Text Page per Loretha StaplerAmion as per below:  On-Call/Text Page:      [email or web address]      password [email or web address]  If 7PM-7AM, please contact night-coverage [email or web address] Password [email or web address] 10/01/2017, 10:36 AM

## 2017-10-01 NOTE — Progress Notes (Signed)
Apparently no active bleeding--hgb stable, BUN shows further improvement.  Remains on octreotide infusion and Protonix IV q12hr.  Pt somulent, no asterixis. Oriented to month and year.  Does serial 2's slowly and with occasional error, on lactulose.  On heart healthy diet.  IMPR:    1. Stable s/p massive destabilizing UGIB due to varices 3 days ago 2. EtOH liver disease 3. Post-hemorrhagic anemia, mild/mod encephalopathy.  PLAN: No chg in mgt at present.  Michael [name], M.D. Pager [phone] If no answer or after 5 PM call [phone]
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
## 2017-10-01 NOTE — Consult Note (Addendum)
WOC Nurse wound consult note Reason for Consult: Consult requested for cyst locations.  Pt states he had several I&Ds performed for this chronic problem in the past, and [facility]. Wound type: Multiple areas of red lesions, raised above the skin level, very painful to touch. Left posterior shoulder: 3X3cm, 2X2cm, 7X4cm, 2X2cm Left buttock has ruptured; 2X2X.1cm and .2X.2X.1cm, pink and moist, small amt tan drainage Right buttock has ruptured; 1X1X.1cm and .2X.2X.1cm, pink and moist, small amt tan drainage Dressing procedure/placement/frequency: Recommend surgical consult as soon as possible to perform I&D for left posterior shoulder.  Bedside nurse has paged the physician to notify him. Please re-consult if further assistance is needed.  Thank-you,  Cammie Mcgeeawn Baruc Tugwell MSN, RN, CWOCN, [place], CNS [phone]

## 2017-10-02 ENCOUNTER — Encounter (HOSPITAL_COMMUNITY): Payer: Self-pay | Admitting: Certified Registered Nurse Anesthetist

## 2017-10-02 ENCOUNTER — Inpatient Hospital Stay (HOSPITAL_COMMUNITY): Payer: BLUE CROSS/BLUE SHIELD | Admitting: Certified Registered Nurse Anesthetist

## 2017-10-02 ENCOUNTER — Encounter (HOSPITAL_COMMUNITY)
Admission: EM | Disposition: A | Discharge status: Home Health | Payer: Self-pay | Source: Home / Self Care | Attending: Internal Medicine

## 2017-10-02 DIAGNOSIS — K7030 Alcoholic cirrhosis of liver without ascites: Principal | ICD-10-CM

## 2017-10-02 DIAGNOSIS — L02414 Cutaneous abscess of left upper limb: Secondary | ICD-10-CM

## 2017-10-02 DIAGNOSIS — D62 Acute posthemorrhagic anemia: Secondary | ICD-10-CM

## 2017-10-02 HISTORY — PX: IRRIGATION AND DEBRIDEMENT ABSCESS: SHX5252

## 2017-10-02 LAB — CBC
HEMATOCRIT: 23.6 % — AB (ref 39.0–52.0)
Hemoglobin: 8.2 g/dL — ABNORMAL LOW (ref 13.0–17.0)
MCH: 31.1 pg (ref 26.0–34.0)
MCHC: 34.7 g/dL (ref 30.0–36.0)
MCV: 89.4 fL (ref 78.0–100.0)
Platelets: 120 10*3/uL — ABNORMAL LOW (ref 150–400)
RBC: 2.64 MIL/uL — AB (ref 4.22–5.81)
RDW: 15.5 % (ref 11.5–15.5)
WBC: 8 10*3/uL (ref 4.0–10.5)

## 2017-10-02 LAB — COMPREHENSIVE METABOLIC PANEL
ALT: 24 U/L (ref 17–63)
AST: 33 U/L (ref 15–41)
Albumin: 2.6 g/dL — ABNORMAL LOW (ref 3.5–5.0)
Alkaline Phosphatase: 83 U/L (ref 38–126)
Anion gap: 5 (ref 5–15)
BUN: 9 mg/dL (ref 6–20)
CHLORIDE: 110 mmol/L (ref 101–111)
CO2: 21 mmol/L — AB (ref 22–32)
Calcium: 8.3 mg/dL — ABNORMAL LOW (ref 8.9–10.3)
Creatinine, Ser: 1.04 mg/dL (ref 0.61–1.24)
GFR calc Af Amer: >60 mL/min (ref >60)
Glucose, Bld: 93 mg/dL (ref 65–99)
POTASSIUM: 3.9 mmol/L (ref 3.5–5.1)
SODIUM: 136 mmol/L (ref 135–145)
Total Bilirubin: 1.5 mg/dL — ABNORMAL HIGH (ref 0.3–1.2)
Total Protein: 5.4 g/dL — ABNORMAL LOW (ref 6.5–8.1)

## 2017-10-02 LAB — AMMONIA: Ammonia: 53 umol/L — ABNORMAL HIGH (ref 9–35)

## 2017-10-02 SURGERY — IRRIGATION AND DEBRIDEMENT ABSCESS
Anesthesia: General | Site: Shoulder | Laterality: Left

## 2017-10-02 MED ORDER — ROCURONIUM BROMIDE 10 MG/ML (PF) SYRINGE
PREFILLED_SYRINGE | INTRAVENOUS | Status: DC | PRN
  Administered 2017-10-02: 20 mg via INTRAVENOUS
  Administered 2017-10-02: 40 mg via INTRAVENOUS

## 2017-10-02 MED ORDER — PROPOFOL 10 MG/ML IV BOLUS
INTRAVENOUS | Status: DC | PRN
  Administered 2017-10-02: 120 mg via INTRAVENOUS

## 2017-10-02 MED ORDER — ZOLPIDEM TARTRATE 5 MG PO TABS
10.0000 mg | ORAL_TABLET | Freq: Every evening | ORAL | Status: DC | PRN
  Administered 2017-10-02: 10 mg via ORAL
  Filled 2017-10-02 (×2): qty 2

## 2017-10-02 MED ORDER — MIDAZOLAM HCL 2 MG/2ML IJ SOLN: 0.5000 mg | Freq: Once | INTRAMUSCULAR | Status: DC | PRN

## 2017-10-02 MED ORDER — SUGAMMADEX SODIUM 200 MG/2ML IV SOLN
INTRAVENOUS | Status: DC | PRN
  Administered 2017-10-02: 200 mg via INTRAVENOUS

## 2017-10-02 MED ORDER — ADALIMUMAB 40 MG/0.8ML ~~LOC~~ PSKT
40.0000 mg | PREFILLED_SYRINGE | SUBCUTANEOUS | Status: DC
  Administered 2017-10-03: 40 mg via SUBCUTANEOUS
  Filled 2017-10-02 (×2): qty 0.8

## 2017-10-02 MED ORDER — BUPIVACAINE-EPINEPHRINE (PF) 0.25% -1:200000 IJ SOLN
INTRAMUSCULAR | Status: AC
  Filled 2017-10-02: qty 30

## 2017-10-02 MED ORDER — MIDAZOLAM HCL 2 MG/2ML IJ SOLN
INTRAMUSCULAR | Status: AC
Start: 2017-10-02 — End: 2017-10-02
  Filled 2017-10-02: qty 2

## 2017-10-02 MED ORDER — BUPIVACAINE-EPINEPHRINE 0.25% -1:200000 IJ SOLN
INTRAMUSCULAR | Status: DC | PRN
  Administered 2017-10-02: 10 mL

## 2017-10-02 MED ORDER — LIDOCAINE 2% (20 MG/ML) 5 ML SYRINGE
INTRAMUSCULAR | Status: DC | PRN
  Administered 2017-10-02: 40 mg via INTRAVENOUS

## 2017-10-02 MED ORDER — FENTANYL CITRATE (PF) 250 MCG/5ML IJ SOLN
INTRAMUSCULAR | Status: AC
  Filled 2017-10-02: qty 5

## 2017-10-02 MED ORDER — FENTANYL CITRATE (PF) 100 MCG/2ML IJ SOLN
INTRAMUSCULAR | Status: DC | PRN
  Administered 2017-10-02: 50 ug via INTRAVENOUS
  Administered 2017-10-02: 100 ug via INTRAVENOUS

## 2017-10-02 MED ORDER — HYDROMORPHONE HCL 1 MG/ML IJ SOLN: 0.2500 mg | INTRAMUSCULAR | Status: DC | PRN

## 2017-10-02 MED ORDER — MIDAZOLAM HCL 5 MG/5ML IJ SOLN
INTRAMUSCULAR | Status: DC | PRN
  Administered 2017-10-02: 2 mg via INTRAVENOUS

## 2017-10-02 MED ORDER — 0.9 % SODIUM CHLORIDE (POUR BTL) OPTIME
TOPICAL | Status: DC | PRN
  Administered 2017-10-02: 1000 mL

## 2017-10-02 MED ORDER — ONDANSETRON HCL 4 MG/2ML IJ SOLN
INTRAMUSCULAR | Status: DC | PRN
  Administered 2017-10-02: 4 mg via INTRAVENOUS

## 2017-10-02 MED ORDER — LACTATED RINGERS IV SOLN: INTRAVENOUS | Status: DC

## 2017-10-02 MED ORDER — DEXAMETHASONE SODIUM PHOSPHATE 10 MG/ML IJ SOLN
INTRAMUSCULAR | Status: DC | PRN
  Administered 2017-10-02: 10 mg via INTRAVENOUS

## 2017-10-02 MED ORDER — MEPERIDINE HCL 25 MG/ML IJ SOLN
6.2500 mg | INTRAMUSCULAR | Status: DC | PRN
Start: 2017-10-02 — End: 2017-10-02

## 2017-10-02 MED ORDER — PROMETHAZINE HCL 25 MG/ML IJ SOLN: 6.2500 mg | INTRAMUSCULAR | Status: DC | PRN

## 2017-10-02 SURGICAL SUPPLY — 31 items
BNDG GAUZE ELAST 4 BULKY (GAUZE/BANDAGES/DRESSINGS) ×2 IMPLANT
CANISTER SUCT 3000ML PPV (MISCELLANEOUS) ×2 IMPLANT
COVER SURGICAL LIGHT HANDLE (MISCELLANEOUS) ×2 IMPLANT
DRAPE LAPAROSCOPIC ABDOMINAL (DRAPES) ×2 IMPLANT
DRAPE UTILITY XL STRL (DRAPES) ×2 IMPLANT
ELECT CAUTERY BLADE 6.4 (BLADE) ×2 IMPLANT
ELECT REM PT RETURN 9FT ADLT (ELECTROSURGICAL) ×2
ELECTRODE REM PT RTRN 9FT ADLT (ELECTROSURGICAL) ×1 IMPLANT
GAUZE SPONGE 2X2 8PLY STRL LF (GAUZE/BANDAGES/DRESSINGS) ×1 IMPLANT
GAUZE SPONGE 4X4 12PLY STRL (GAUZE/BANDAGES/DRESSINGS) ×2 IMPLANT
GLOVE BIO SURGEON STRL SZ7.5 (GLOVE) ×2 IMPLANT
GOWN STRL REUS W/ TWL LRG LVL3 (GOWN DISPOSABLE) ×2 IMPLANT
GOWN STRL REUS W/TWL LRG LVL3 (GOWN DISPOSABLE) ×2
KIT BASIN OR (CUSTOM PROCEDURE TRAY) ×2 IMPLANT
KIT ROOM TURNOVER OR (KITS) ×2 IMPLANT
NEEDLE HYPO 25GX1X1/2 BEV (NEEDLE) ×2 IMPLANT
NS IRRIG 1000ML POUR BTL (IV SOLUTION) ×2 IMPLANT
PACK SURGICAL SETUP 50X90 (CUSTOM PROCEDURE TRAY) ×2 IMPLANT
PAD ABD 8X10 STRL (GAUZE/BANDAGES/DRESSINGS) ×4 IMPLANT
PAD ARMBOARD 7.5X6 YLW CONV (MISCELLANEOUS) ×4 IMPLANT
PENCIL BUTTON HOLSTER BLD 10FT (ELECTRODE) ×2 IMPLANT
SPONGE GAUZE 2X2 STER 10/PKG (GAUZE/BANDAGES/DRESSINGS) ×1
SPONGE LAP 18X18 X RAY DECT (DISPOSABLE) ×2 IMPLANT
SWAB COLLECTION DEVICE MRSA (MISCELLANEOUS) ×2 IMPLANT
SWAB CULTURE ESWAB REG 1ML (MISCELLANEOUS) ×2 IMPLANT
SYR BULB 3OZ (MISCELLANEOUS) ×2 IMPLANT
SYR CONTROL 10ML LL (SYRINGE) ×2 IMPLANT
TAPE CLOTH SURG 4X10 WHT LF (GAUZE/BANDAGES/DRESSINGS) ×2 IMPLANT
TOWEL OR 17X24 6PK STRL BLUE (TOWEL DISPOSABLE) ×2 IMPLANT
TUBE CONNECTING 12X1/4 (SUCTIONS) ×2 IMPLANT
YANKAUER SUCT BULB TIP NO VENT (SUCTIONS) ×2 IMPLANT

## 2017-10-02 NOTE — Transfer of Care (Signed)
Immediate Anesthesia Transfer of Care Note  Patient: Michael [name]  Procedure(s) Performed: IRRIGATION AND DEBRIDEMENT SHOULDER ABSCESS (Left Shoulder)  Patient Location: PACU  Anesthesia Type:General  Level of Consciousness: awake, alert , oriented and patient cooperative  Airway & Oxygen Therapy: Patient Spontanous Breathing and Patient connected to nasal cannula oxygen  Post-op Assessment: Report given to RN, Post -op Vital signs reviewed and stable and Patient moving all extremities X 4  Post vital signs: Reviewed and stable  Last Vitals:  Vitals:   10/02/17 0329 10/02/17 0731  BP: 133/82 125/76  Pulse: 73 79  Resp: 14 16  Temp: 36.8 C 37.1 C  SpO2: 97% 100%    Last Pain:  Vitals:   10/02/17 0731  TempSrc: Oral  PainSc:       Patients Stated Pain Goal: 0 (10/01/17 1556)  Complications: No apparent anesthesia complications

## 2017-10-02 NOTE — Progress Notes (Signed)
PROGRESS NOTE    Michael [name]  WGN:[id number] DOB: 10/01/1966 DOA: 09/28/2017 PCP: Patient, No Pcp Per   Brief Narrative:  51 year old WM PMHx Anxiety, Depression, EtOH abuse, transaminitis, Mallory-Weiss tear, Anemia, HTN, MRSA, OSA  Who's neighbor called EMS after hearing him fall. Patient had no recollection of the event. He was found in a puddle of dark blood and when EMS set him up he became unresponsive. Blood pressure in the field was extremely low.     Subjective: 11/7 A/O 4, negative CP, negative SOB, negative N/V, negative abdominal pain. No complaints of left shoulder pain. Patient requesting continue when he will be discharged.   Assessment & Plan:   Active Problems:   GI bleed   Pressure injury of skin  Left shoulder abscess/Hydradenitis -Per patient has had multiple episodes of abscesses requiring I and D +ABx  -11/7 S/P I&D: 2 large abscesses with copious amount of purulent drainage see results below. Continue empiric antibiotics. -Cultures pending -Unknown if patient will require further I&D will await recommendation from surgery.  GI bleed. -Grade III and large (> 5 mm) esophageal varices.  Dieulafoy lesion of stomach. Banded and injected see results below. 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  Coagulopathy due to liver disease -See EtOH cirrhosis       DVT prophylaxis: SCD Code Status: Full Family Communication: None Disposition Plan: TBD   [facility]   Procedures/Significant Events:  11/3 admission 11/3 EGD - Grade III and large (> 5 mm) esophageal varices. - Completely  eradicated. Banded. - Dieulafoy lesion of stomach.  with hemostatic clip  And  injection 11/5 TR[facility] assumed care 11/7S/P I&D LEFT shoulder abscess:2 large abscess cavities in this area that were chronic. These areas were infiltrated with quarter percent Marcaine. The cavities were opened sharply with a 15 blade knife. Cultures were obtained. A large amount of purulence was evacuated.    I have personally reviewed and interpreted all radiology studies and my findings are as above.  VENTILATOR SETTINGS:    Cultures 11/3 MRSA by PCR negative 11/7 LEFT shoulder abscess pending     Antimicrobials: Anti-infectives (From admission, onward)   Start     Stop   10/01/17 1200  doxycycline (VIBRA-TABS) tablet 100 mg         09/28/17 0845  azithromycin (ZITHROMAX) 500 mg in dextrose 5 % 250 mL IVPB  Status:  Discontinued     10/01/17 1106       Devices    LINES / TUBES:      Continuous Infusions: . sodium chloride 10 mL/hr at 10/01/17 1053  . octreotide  (SANDOSTATIN)    IV infusion 50 mcg/hr (10/02/17 0415)     Objective: Vitals:   10/01/17 1609 10/01/17 2012 10/01/17 2333 10/02/17 0329  BP: 136/86 137/85 131/80 133/82  Pulse: 77 77 73 73  Resp: 17 16 19 14   Temp: 98.9 F (37.2 C) 99.4 F (37.4 C) 98.3 F (36.8 C) 98.2 F (36.8 C)  TempSrc: Oral Oral Oral Oral  SpO2: 97% 97% 98% 97%  Weight:      Height:        Intake/Output Summary (Last  24 hours) at 10/02/2017 0718 Last data filed at 10/02/2017 0547 Gross per 24 hour  Intake 440 ml  Output 1026 ml  Net -586 ml   Filed Weights   09/28/17 1100 10/01/17 0403  Weight: 203 lb 14.8 oz (92.5 kg) 203 lb 0.7 oz (92.1 kg)    Examination:  General: A/O 4, No acute respiratory distress Neck:  Negative scars, masses, torticollis, lymphadenopathy, JVD Lungs: Clear to auscultation bilaterally without wheezes or crackles Cardiovascular: Regular rate and rhythm without murmur gallop or rub normal S1 and S2 Abdomen:  negative abdominal pain, nondistended, positive soft, bowel sounds, no rebound, no ascites, no appreciable mass Extremities: No significant cyanosis, clubbing, or edema bilateral lower extremities Skin: Large area over left shoulder blade covered and clean consistent with I&D, did not take down dressing.  Psychiatric:  Negative depression, negative anxiety, negative fatigue, negative mania  Central nervous system:  Cranial nerves II through XII intact, tongue/uvula midline, all extremities muscle strength 5/5, sensation intact throughout, negative dysarthria, negative expressive aphasia, negative receptive aphasia.  .     Data Reviewed: Care during the described time interval was provided by me .  I have reviewed this patient's available data, including medical history, events of note, physical examination, and all test results as part of my evaluation.   CBC: Recent Labs  Lab 09/28/17 0710 09/28/17 1249  09/30/17 0513 09/30/17 0917 09/30/17 1535 10/01/17 0304 10/02/17 0235  WBC 20.9* 13.9*   < > 7.4 6.7 7.6 7.0 8.0  NEUTROABS 15.5* 9.4*  --   --   --   --   --   --   HGB 6.9* 9.6*   < > 7.8* 8.2* 8.2* 8.1* 8.2*  HCT 20.4* 27.7*   < > 22.2* 23.9* 23.9* 23.7* 23.6*  MCV 92.7 89.6   < > 88.8 89.5 89.5 89.8 89.4  PLT 238 121*   < > 115* 117* 122* 122* 120*   < > = values in this interval not displayed.   Basic Metabolic Panel: Recent Labs  Lab 09/28/17 1249 09/29/17 0545 09/30/17 0001 09/30/17 0513 10/01/17 0304 10/02/17 0235  NA 137 140  --  139 137 136  K 5.5* 4.9  --  4.2 3.9 3.9  CL 115* 117*  --  115* 115* 110  CO2 13* 16*  --  18* 18* 21*  GLUCOSE 96 112*  --  98 89 93  BUN 41* 33*  --  23* 13 9  CREATININE 2.39* 1.50*  --  1.14 1.02 1.04  CALCIUM 8.4* 8.0*  --  8.1* 8.1* 8.3*  MG  --  1.3* 2.2 2.0  --   --   PHOS  --  2.8  --  3.0  --   --    GFR: Estimated Creatinine Clearance: 98.6 mL/min (by C-G formula based on SCr of 1.04 mg/dL). Liver Function  Tests: Recent Labs  Lab 09/28/17 0710 10/01/17 0304 10/02/17 0235  AST 29 42* 33  ALT 12* 29 24  ALKPHOS 78 81 83  BILITOT 1.1 1.6* 1.5*  PROT 5.7* 5.3* 5.4*  ALBUMIN 2.5* 2.6* 2.6*   No results for input(s): LIPASE, AMYLASE in the last 168 hours. Recent Labs  Lab 09/28/17 0710 10/01/17 0304 10/02/17 0235  AMMONIA 216* 80* 53*   Coagulation Profile: Recent Labs  Lab 09/28/17 0710 09/28/17 1338 09/30/17 0513 10/01/17 0304  INR 1.89 1.84 1.45 1.47   Cardiac Enzymes: No results for input(s): CKTOTAL, CKMB, CKMBINDEX, TROPONINI in the last  168 hours. BNP (last 3 results) No results for input(s): PROBNP in the last 8760 hours. HbA1C: No results for input(s): HGBA1C in the last 72 hours. CBG: Recent Labs  Lab 09/28/17 0737 09/28/17 1233 09/28/17 1937 09/29/17 0024 09/29/17 0352  GLUCAP 126* 132* 99 85 108*   Lipid Profile: No results for input(s): CHOL, HDL, LDLCALC, TRIG, CHOLHDL, LDLDIRECT in the last 72 hours. Thyroid Function Tests: No results for input(s): TSH, T4TOTAL, FREET4, T3FREE, THYROIDAB in the last 72 hours. Anemia Panel: No results for input(s): VITAMINB12, FOLATE, FERRITIN, TIBC, IRON, RETICCTPCT in the last 72 hours. Urine analysis:    Component Value Date/Time   COLORURINE STRAW (A) 04/16/2017 1412   APPEARANCEUR CLEAR 04/16/2017 1412   LABSPEC 1.003 (L) 04/16/2017 1412   PHURINE 7.0 04/16/2017 1412   GLUCOSEU NEGATIVE 04/16/2017 1412   HGBUR NEGATIVE 04/16/2017 1412   BILIRUBINUR NEGATIVE 04/16/2017 1412   BILIRUBINUR neg 05/17/2014 1736   KETONESUR NEGATIVE 04/16/2017 1412   PROTEINUR NEGATIVE 04/16/2017 1412   UROBILINOGEN 1.0 05/17/2014 1736   NITRITE NEGATIVE 04/16/2017 1412   LEUKOCYTESUR NEGATIVE 04/16/2017 1412   Sepsis Labs: @LABRCNTIP (procalcitonin:4,lacticidven:4)  ) Recent Results (from the past 240 hour(s))  MRSA PCR Screening     Status: None   Collection Time: 09/28/17 10:56 AM  Result Value Ref Range Status    MRSA by PCR NEGATIVE NEGATIVE Final    Comment:        The GeneXpert MRSA Assay (FDA approved for NASAL specimens only), is one component of a comprehensive MRSA colonization surveillance program. It is not intended to diagnose MRSA infection nor to guide or monitor treatment for MRSA infections.          Radiology Studies: No results found.      Scheduled Meds: . [START ON 10/03/2017] Adalimumab  40 mg Subcutaneous Weekly  . chlorhexidine  15 mL Mouth Rinse BID  . doxycycline  100 mg Oral Q12H  . folic acid  1 mg Oral Daily  . ISOtretinoin  40 mg Oral BID  . lactulose  10 g Oral TID  . mouth rinse  15 mL Mouth Rinse q12n4p  . nicotine  21 mg Transdermal Daily  . pantoprazole  40 mg Intravenous Q12H  . propranolol  10 mg Oral BID  . thiamine  100 mg Oral Daily  . traZODone  100 mg Oral QHS   Continuous Infusions: . sodium chloride 10 mL/hr at 10/01/17 1053  . octreotide  (SANDOSTATIN)    IV infusion 50 mcg/hr (10/02/17 0415)     LOS: 4 days    Time spent: 40 minutes    WOODS, Roselind Messier, MD [facility] Pager [phone]   If 7PM-7AM, please contact night-coverage [email or web address] Password [email or web address] 10/02/2017, 7:18 AM

## 2017-10-02 NOTE — Progress Notes (Addendum)
Abscess on left upper back ruptured spontaneously, copious amount of purulent drainage on bedding. Moderate amount of drainage continued while cleansing the area. Wiped back with CHG wipes and reapplied foam dressing. Will continue to monitor.

## 2017-10-02 NOTE — Anesthesia Procedure Notes (Signed)
Procedure Name: Intubation Date/Time: 10/02/2017 10:14 AM Performed by: Waynard EdwardsSmith, Jashaun Penrose A, CRNA Pre-anesthesia Checklist: Patient identified, Emergency Drugs available, Suction available and Patient being monitored Patient Re-evaluated:Patient Re-evaluated prior to induction Oxygen Delivery Method: Circle system utilized Preoxygenation: Pre-oxygenation with 100% oxygen Induction Type: IV induction Ventilation: Mask ventilation without difficulty Laryngoscope Size: Miller and 2 Grade View: Grade I Tube type: Oral Tube size: 7.5 mm Number of attempts: 1 Airway Equipment and Method: Stylet Placement Confirmation: ETT inserted through vocal cords under direct vision,  positive ETCO2 and breath sounds checked- equal and bilateral Secured at: 23 cm Tube secured with: Tape Dental Injury: Teeth and Oropharynx as per pre-operative assessment

## 2017-10-02 NOTE — Progress Notes (Signed)
[name] Progress Note  Michael RhymesDonald [name] 51 y.o. 08/04/1966  CC:  GI bleed   Subjective: Patient was seen in the preop area. Planning for  I&D of shoulder abscess. Denied further GI bleed. Had brown color stool yesterday. Denied abdominal pain.  ROS : Negative for chest pain and shortness of breath. Negative for nausea and vomiting.   Objective: Vital signs in last 24 hours: Vitals:   10/02/17 0329 10/02/17 0731  BP: 133/82 125/76  Pulse: 73 79  Resp: 14 16  Temp: 98.2 F (36.8 C) 98.7 F (37.1 C)  SpO2: 97% 100%    Physical Exam:  General:  Alert, cooperative, no distress, appears stated age  Head:  Normocephalic, without obvious abnormality, atraumatic  Eyes:  , EOM's intact,   Lungs:   Clear to auscultation bilaterally, respirations unlabored  Heart:  Regular rate and rhythm, S1, S2 normal  Abdomen:   Soft, non-tender, bowel sounds active all four quadrants,  no masses,   Extremities: Extremities normal, atraumatic, no  edema  Pulses: 2+ and symmetric    Lab Results: Recent Labs    09/30/17 0001  09/30/17 0513 10/01/17 0304 10/02/17 0235  NA  --    < > 139 137 136  K  --    < > 4.2 3.9 3.9  CL  --    < > 115* 115* 110  CO2  --    < > 18* 18* 21*  GLUCOSE  --    < > 98 89 93  BUN  --    < > 23* 13 9  CREATININE  --    < > 1.14 1.02 1.04  CALCIUM  --    < > 8.1* 8.1* 8.3*  MG 2.2  --  2.0  --   --   PHOS  --   --  3.0  --   --    < > = values in this interval not displayed.   Recent Labs    10/01/17 0304 10/02/17 0235  AST 42* 33  ALT 29 24  ALKPHOS 81 83  BILITOT 1.6* 1.5*  PROT 5.3* 5.4*  ALBUMIN 2.6* 2.6*   Recent Labs    10/01/17 0304 10/02/17 0235  WBC 7.0 8.0  HGB 8.1* 8.2*  HCT 23.7* 23.6*  MCV 89.8 89.4  PLT 122* 120*   Recent Labs    09/30/17 0513 10/01/17 0304  LABPROT 17.6* 17.7*  INR 1.45 1.47      Assessment/Plan: - Upper GI bleed. EGD on 09/28/2017 showed large esophageal varices as well as possible  Dieulafoy lesion which was treated with epinephrine injection and clip placement. He also had band ligation on his varices. - Alcoholic cirrhosis. - Acute blood loss anemia. Resolved. Hemoglobin stable now. - shoulder abscess. Planning for I & D today.  Recommendations ------------------------- - No further bleeding episodes. Hemoglobin stable. BUN is trending down. - Okay to stop octreotide from GI standpoint. - Switch to by mouth twice a day PPI. - Continue low-dose propranolol for now. Absolute alcohol abstinence advised. - Monitor H&H. Avoid over transfusion. - GI will follow   Kathi DerParag Saket Hellstrom MD, FACP 10/02/2017, 9:05 AM  Pager [phone]  If no answer or after 5 PM call [phone]

## 2017-10-02 NOTE — Clinical Social Work Note (Signed)
Clinical Social Work Assessment  Patient Details  Name: Michael [name] MRN: [id number] Date of Birth: 1966-09-21  Date of referral:  10/02/17               Reason for consult:  Substance Use/ETOH Abuse                Permission sought to share information with:  Facility Art therapist granted to share information::  Yes, Verbal Permission Granted  Name::     Michael [name]  Agency::     Relationship::  significant other  Contact Information:  [phone]  Housing/Transportation Living arrangements for the past 2 months:  [place] of Information:  Patient Patient Interpreter Needed:  None Criminal Activity/Legal Involvement Pertinent to Current Situation/Hospitalization:  No - Comment as needed Significant Relationships:  Friend, Significant Other Lives with:  Significant Other Do you feel safe going back to the place where you live?  Yes Need for family participation in patient care:  Yes (Comment)  Care giving concerns:  No family at bedside. Patient lives at home with his partner   Facilities manager / plan:  CSW met patient at bedside to discuss resources for substance abuse. Patient stated he drink 3 beers a day to help control his constant back/leg pain. Patient stated that he only drinks beer and sometimes wine but no hard alcohol. Patient stated he does not use illicit drugs. CSW offered patient resources to assist with his substance abuse but patient stated he does not want it because it does not work for him. Patient stated he would like some resources on pain management to help control his pain. CSW relayed message to MD. [place] signing off as patient no longer has social work needs.  Employment status:    Insurance information:  Other (Comment Required)(BC/BS) PT Recommendations:  Not assessed at this time Information / Referral to community resources:  Outpatient Substance Abuse Treatment Options  Patient/Family's Response to care:  Patient stated he has support from his partner  Patient/Family's Understanding of and Emotional Response to Diagnosis, Current Treatment, and Prognosis:  Patient is declining resources to assist him with his substance use.   Emotional Assessment Appearance:  Appears stated age Attitude/Demeanor/Rapport:  Other Affect (typically observed):  Pleasant Orientation:  Oriented to Place, Oriented to Self, Oriented to Situation, Oriented to  Time Alcohol / Substance use:  Alcohol Use Psych involvement (Current and /or in the community):  No (Comment)  Discharge Needs  Concerns to be addressed:  Substance Abuse Concerns Readmission within the last 30 days:  No Current discharge risk:  Substance Abuse Barriers to Discharge:  Active Substance Use   Wende Neighbors, LCSW 10/02/2017, 4:25 PM

## 2017-10-02 NOTE — Anesthesia Preprocedure Evaluation (Addendum)
Anesthesia Evaluation  Patient identified by MRN, date of birth, ID band Patient awake    Reviewed: Allergy & Precautions, NPO status , Patient's Chart, lab work & pertinent test results  History of Anesthesia Complications Negative for: history of anesthetic complications  Airway Mallampati: II  TM Distance: >3 FB Neck ROM: Full    Dental  (+) Dental Advisory Given, Poor Dentition, Chipped   Pulmonary asthma , sleep apnea and Continuous Positive Airway Pressure Ventilation , COPD,  COPD inhaler, Current Smoker,    breath sounds clear to auscultation       Cardiovascular hypertension, Pt. on medications and Pt. on home beta blockers  Rhythm:Regular Rate:Normal     Neuro/Psych  Headaches, PSYCHIATRIC DISORDERS (ADD) Anxiety Depression    GI/Hepatic GERD  Medicated and Controlled,(+) Cirrhosis   Esophageal Varices  substance abuse  alcohol use,   Endo/Other  negative endocrine ROS  Renal/GU negative Renal ROS     Musculoskeletal  (+) Arthritis ,   Abdominal   Peds  Hematology  (+) Blood dyscrasia (Hb 8.2, plt 120k), anemia ,   Anesthesia Other Findings   Reproductive/Obstetrics                            Anesthesia Physical Anesthesia Plan  ASA: III  Anesthesia Plan: General   Post-op Pain Management:    Induction: Intravenous  PONV Risk Score and Plan: 1 and Ondansetron and Dexamethasone  Airway Management Planned: Oral ETT  Additional Equipment:   Intra-op Plan:   Post-operative Plan: Extubation in OR  Informed Consent: I have reviewed the patients History and Physical, chart, labs and discussed the procedure including the risks, benefits and alternatives for the proposed anesthesia with the patient or authorized representative who has indicated his/her understanding and acceptance.   Dental advisory given  Plan Discussed with: CRNA and Surgeon  Anesthesia Plan  Comments: (Plan routine monitors, GETA)        Anesthesia Quick Evaluation

## 2017-10-02 NOTE — Op Note (Signed)
10/02/2017  11:16 AM  PATIENT:  Neena Rhymesonald Benally  51 y.o. male  PRE-OPERATIVE DIAGNOSIS:  LEFT AND RIGHT SHOULDER ABSCESS  POST-OPERATIVE DIAGNOSIS:  LEFT AND RIGHT SHOULDER ABSCESS  PROCEDURE:  Procedure(s): IRRIGATION AND DEBRIDEMENT SHOULDER ABSCESS (Left)  SURGEON:  Surgeon(s) and Role:    * Griselda Mineroth, Blondina Coderre III, MD - Primary  PHYSICIAN ASSISTANT:   ASSISTANTS: none   ANESTHESIA:   general  EBL:  Minimal   BLOOD ADMINISTERED:none  DRAINS: none   LOCAL MEDICATIONS USED:  MARCAINE     SPECIMEN:  No Specimen  DISPOSITION OF SPECIMEN:  N/A  COUNTS:  YES  TOURNIQUET:  * No tourniquets in log *  DICTATION: .Dragon Dictation   After informed consent was obtained the patient was brought to the operating room and left in the supine position on the stretcher. After adequate induction of general anesthesia the patient was moved into a prone position on the operating room table and all pressure points were padded. The upper back area was prepped with Betadine and draped in the usual sterile manner. An appropriate timeout was performed. There was a small abscess cavity on the right upper back. This area was infiltrated with quarter percent Marcaine. A small incision was made with a 15 blade knife. The incision was carried into the abscess cavity sharply with electrocautery. The granulation tissue was fulgurated with the cautery until the area was completely hemostatic. The wound was then packed with a 2 x 2 gauze. Attention was then turned to the left upper back. There were 2 large abscess cavities in this area that were chronic. These areas were infiltrated with quarter percent Marcaine. The cavities were opened sharply with a 15 blade knife. Cultures were obtained. A large amount of purulence was evacuated. The chronic abscess cavities were excised sharply with the electrocautery so that the tissue that was left was clear. Tissue. Hemostasis was achieved using the Bovie electrocautery. Each  of the wounds was then packed with either 4 x 4 gauze or Kerlix and sterile dressings were applied. The patient tolerated the procedure well. At the end of the case all needle sponge and instrument counts were correct. The patient was then awakened and taken to recovery in stable condition.  PLAN OF CARE: Admit to inpatient   PATIENT DISPOSITION:  PACU - hemodynamically stable.   Delay start of Pharmacological VTE agent (>24hrs) due to surgical blood loss or risk of bleeding: no

## 2017-10-02 NOTE — Care Management Note (Signed)
Case Management Note  Patient Details  Name: Michael [name] MRN: [id number] Date of Birth: 03/16/1966  Subjective/Objective:   Pt  Admitted with GI bleed - s/p EDG with banding of varices - pt remains on ocreotide drip.  Pt also had I&D 11/7 for shoulder abscess  Action/Plan:  PTA independent from home.  Mom at bedside and will serve as support person at discharge.  CM will continue to follow for discharge needs   Expected Discharge Date:                  Expected Discharge Plan:  Home/Self Care  In-House Referral:     Discharge planning Services  CM Consult  Post Acute Care Choice:    Choice offered to:     DME Arranged:    DME Agency:     HH Arranged:    HH Agency:     Status of Service:     If discussed at [employer] Length of Stay Meetings, dates discussed:    Additional Comments:  Cherylann ParrClaxton, Jkai Arwood S, RN 10/02/2017, 3:19 PM

## 2017-10-02 NOTE — Anesthesia Postprocedure Evaluation (Signed)
Anesthesia Post Note  Patient: Neena RhymesDonald Olkowski  Procedure(s) Performed: IRRIGATION AND DEBRIDEMENT SHOULDER ABSCESS (Left Shoulder)     Patient location during evaluation: PACU Anesthesia Type: General Level of consciousness: awake and alert, patient cooperative and oriented Pain management: pain level controlled Vital Signs Assessment: post-procedure vital signs reviewed and stable Respiratory status: spontaneous breathing, nonlabored ventilation, respiratory function stable and patient connected to nasal cannula oxygen Cardiovascular status: blood pressure returned to baseline and stable Postop Assessment: no apparent nausea or vomiting Anesthetic complications: no    Last Vitals:  Vitals:   10/02/17 1155 10/02/17 1314  BP: 122/75 130/73  Pulse: (!) 58 100  Resp: 10   Temp: (!) 36.3 C 36.5 C  SpO2: 93% 98%    Last Pain:  Vitals:   10/02/17 1314  TempSrc: Oral  PainSc:                  Lynton Crescenzo,E. Leisl Spurrier

## 2017-10-03 ENCOUNTER — Encounter (HOSPITAL_COMMUNITY): Payer: Self-pay | Admitting: General Surgery

## 2017-10-03 DIAGNOSIS — K264 Chronic or unspecified duodenal ulcer with hemorrhage: Secondary | ICD-10-CM

## 2017-10-03 DIAGNOSIS — K7200 Acute and subacute hepatic failure without coma: Secondary | ICD-10-CM

## 2017-10-03 LAB — BASIC METABOLIC PANEL
Anion gap: 6 (ref 5–15)
BUN: 10 mg/dL (ref 6–20)
CHLORIDE: 108 mmol/L (ref 101–111)
CO2: 20 mmol/L — AB (ref 22–32)
CREATININE: 0.94 mg/dL (ref 0.61–1.24)
Calcium: 8.2 mg/dL — ABNORMAL LOW (ref 8.9–10.3)
GFR calc non Af Amer: >60 mL/min (ref >60)
Glucose, Bld: 148 mg/dL — ABNORMAL HIGH (ref 65–99)
Potassium: 4.4 mmol/L (ref 3.5–5.1)
Sodium: 134 mmol/L — ABNORMAL LOW (ref 135–145)

## 2017-10-03 LAB — CBC
HEMATOCRIT: 22.7 % — AB (ref 39.0–52.0)
Hemoglobin: 7.9 g/dL — ABNORMAL LOW (ref 13.0–17.0)
MCH: 31.2 pg (ref 26.0–34.0)
MCHC: 34.8 g/dL (ref 30.0–36.0)
MCV: 89.7 fL (ref 78.0–100.0)
Platelets: 136 10*3/uL — ABNORMAL LOW (ref 150–400)
RBC: 2.53 MIL/uL — AB (ref 4.22–5.81)
RDW: 15.5 % (ref 11.5–15.5)
WBC: 10.2 10*3/uL (ref 4.0–10.5)

## 2017-10-03 LAB — PROTIME-INR
INR: 1.65
Prothrombin Time: 19.4 seconds — ABNORMAL HIGH (ref 11.4–15.2)

## 2017-10-03 LAB — MAGNESIUM: Magnesium: 1.5 mg/dL — ABNORMAL LOW (ref 1.7–2.4)

## 2017-10-03 LAB — AMMONIA: AMMONIA: 59 umol/L — AB (ref 9–35)

## 2017-10-03 MED ORDER — MAGNESIUM SULFATE 2 GM/50ML IV SOLN
2.0000 g | Freq: Once | INTRAVENOUS | Status: AC
  Administered 2017-10-03: 2 g via INTRAVENOUS
  Filled 2017-10-03: qty 50

## 2017-10-03 MED ORDER — MORPHINE SULFATE (PF) 2 MG/ML IV SOLN
2.0000 mg | Freq: Once | INTRAVENOUS | Status: AC
  Administered 2017-10-03: 2 mg via INTRAVENOUS
  Filled 2017-10-03: qty 1

## 2017-10-03 MED ORDER — PANTOPRAZOLE SODIUM 40 MG PO TBEC
40.0000 mg | DELAYED_RELEASE_TABLET | Freq: Two times a day (BID) | ORAL | Status: DC
  Administered 2017-10-03 – 2017-10-04 (×3): 40 mg via ORAL
  Filled 2017-10-03 (×3): qty 1

## 2017-10-03 NOTE — Care Management Note (Addendum)
Case Management Note  Patient Details  Name: Michael [name] MRN: [id number] Date of Birth: 07/09/1966  Subjective/Objective:   Pt  Admitted with GI bleed - s/p EDG with banding of varices - pt remains on ocreotide drip.  Pt also had I&D 11/7 for shoulder abscess  Action/Plan:  PTA independent from home.  Mom at bedside and will serve as support person at discharge.  CM will continue to follow for discharge needs   Expected Discharge Date:                  Expected Discharge Plan:  Home/Self Care  In-House Referral:     Discharge planning Services  CM Consult  Post Acute Care Choice:    Choice offered to:     DME Arranged:    DME Agency:     HH Arranged:    HH Agency:     Status of Service:     If discussed at [employer] Length of Stay Meetings, dates discussed:    Additional Comments: Per bedside nurse pt will go home with wet to dry dressing changes to back - partners states that he can perform dressing changes if instructed to do so- per bedside nurse pt may need wound vac in the future.  Pt [name] - pt denied barriers to obtaining and paying for medications as prescribed Michael ParrClaxton, Michael [name] S, RN 10/03/2017, 2:48 PM

## 2017-10-03 NOTE — Progress Notes (Signed)
Pt. Ambulating well. Transferred to tely box .

## 2017-10-03 NOTE — Progress Notes (Signed)
PROGRESS NOTE    Michael RhymesDonald [name]  NGE:[id number] DOB: 05/12/1966 DOA: 09/28/2017 PCP: Michael [name], No Pcp Per   Brief Narrative:  51 year old WM PMHx Anxiety, Depression, EtOH abuse, transaminitis, Mallory-Weiss tear, Anemia, HTN, MRSA, OSA  Who's neighbor called EMS after hearing him fall. Michael [name] had no recollection of the event. He was found in a puddle of dark blood and when EMS set him up he became unresponsive. Blood pressure in the field was extremely low.     Subjective: 11/8 A/O 4, negative CP, negative SOB, negative N/V, negative abdominal pain. Positive left shoulder pain, appropriate for recent surgical debridement    Assessment & Plan:   Active Problems:   GI bleed   Pressure injury of skin  Left shoulder abscesspositive Staphylococcus Lugdunensis /Hydradenitis -Per Michael [name] has had multiple episodes of abscesses requiring I and D +ABx  -11/7 S/P I&D: 2 large abscesses with copious amount of purulent drainage see results below. - -Continued Doxycycline. Additional 7 days per surgery. -Schedule follow-up appointment with Dr. Chevis PrettyPaul Toth  surgery appointment in 1-2 weeks. Prior to discharge in A.m. -Secondary to large debridement area on left shoulder schedule home health, RN  GI bleed. -Grade III and large (> 5 mm) esophageal varices.  Dieulafoy lesion of stomach. Banded and injected see results below. 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 GI may add Rifaximin prior to discharge  EtOH abuse -Counseled at length regarding need for abstinence.  Coagulopathy due to liver disease -See EtOH cirrhosis    Hypomagnesemia -Magnesium IV 2 g      DVT prophylaxis: SCD Code Status:  Full Family Communication: None Disposition Plan: TBD   [facility]   Procedures/Significant Events:  11/3 admission 11/3 EGD - Grade III and large (> 5 mm) esophageal varices. - Completely eradicated. Banded. - Dieulafoy lesion of stomach.  with hemostatic clip  And  injection 11/5 [facility] assumed care 11/7S/P I&D LEFT shoulder abscess:2 large abscess cavities in this area that were chronic. These areas were infiltrated with quarter percent Marcaine. The cavities were opened sharply with a 15 blade knife. Cultures were obtained. A large amount of purulence was evacuated.    I have personally reviewed and interpreted all radiology studies and my findings are as above.  VENTILATOR SETTINGS:    Cultures 11/3 MRSA by PCR negative 11/7 LEFT shoulder abscess positive Staphylococcus Lugdunensis       Antimicrobials: Anti-infectives (From admission, onward)   Start     Stop   10/01/17 1200  doxycycline (VIBRA-TABS) tablet 100 mg         09/28/17 0845  azithromycin (ZITHROMAX) 500 mg in dextrose 5 % 250 mL IVPB  Status:  Discontinued     10/01/17 1106       Devices    LINES / TUBES:      Continuous Infusions: . sodium chloride 10 mL/hr at 10/02/17 2138  . lactated ringers       Objective: Vitals:   10/02/17 1922 10/02/17 2318 10/03/17 0340 10/03/17 0741  BP: 138/88 123/75 122/76   Pulse: 63 63 (!) 57   Resp: 13 11 10    Temp: 97.8 F (36.6 C) (!) 97.3 F (36.3 C) 97.6 F (  36.4 C) 97.7 F (36.5 C)  TempSrc: Oral Oral Oral Axillary  SpO2: 96% 93% 94%   Weight:      Height:        Intake/Output Summary (Last 24 hours) at 10/03/2017 0836 Last data filed at 10/03/2017 0741 Gross per 24 hour  Intake 500 ml  Output 900 ml  Net -400 ml   Filed Weights   09/28/17 1100 10/01/17 0403  Weight: 203 lb 14.8 oz (92.5 kg) 203 lb 0.7 oz (92.1 kg)    Physical Exam:  General: A/O 4, No acute respiratory distress Neck:  Negative scars, masses,  torticollis, lymphadenopathy, JVD Lungs: Clear to auscultation bilaterally without wheezes or crackles Cardiovascular: Regular rate and rhythm without murmur gallop or rub normal S1 and S2 Abdomen: negative abdominal pain, nondistended, positive soft, bowel sounds, no rebound, no ascites, no appreciable mass Extremities: No significant cyanosis, clubbing, or edema bilateral lower extremities Skin: Large area over left shoulder blade covered and clean consistent with I&D. Primary Wound area,  ~10 cm in diameter down to the fascia plane above the shoulder blade. Secondary wound area~6 cm diameter again down to the fascia plane just above the shoulder blade. Psychiatric:  Negative depression, negative anxiety, negative fatigue, negative mania  Central nervous system:  Cranial nerves II through XII intact, tongue/uvula midline, all extremities muscle strength 5/5, sensation intact throughout, negative dysarthria, negative expressive aphasia, negative receptive aphasia. .     Data Reviewed: Care during the described time interval was provided by me .  I have reviewed this Michael [name]'s available data, including medical history, events of note, physical examination, and all test results as part of my evaluation.   CBC: Recent Labs  Lab 09/28/17 0710 09/28/17 1249  09/30/17 0513 09/30/17 0917 09/30/17 1535 10/01/17 0304 10/02/17 0235  WBC 20.9* 13.9*   < > 7.4 6.7 7.6 7.0 8.0  NEUTROABS 15.5* 9.4*  --   --   --   --   --   --   HGB 6.9* 9.6*   < > 7.8* 8.2* 8.2* 8.1* 8.2*  HCT 20.4* 27.7*   < > 22.2* 23.9* 23.9* 23.7* 23.6*  MCV 92.7 89.6   < > 88.8 89.5 89.5 89.8 89.4  PLT 238 121*   < > 115* 117* 122* 122* 120*   < > = values in this interval not displayed.   Basic Metabolic Panel: Recent Labs  Lab 09/29/17 0545 09/30/17 0001 09/30/17 0513 10/01/17 0304 10/02/17 0235 10/03/17 0239  NA 140  --  139 137 136 134*  K 4.9  --  4.2 3.9 3.9 4.4  CL 117*  --  115* 115* 110 108  CO2 16*   --  18* 18* 21* 20*  GLUCOSE 112*  --  98 89 93 148*  BUN 33*  --  23* 13 9 10   CREATININE 1.50*  --  1.14 1.02 1.04 0.94  CALCIUM 8.0*  --  8.1* 8.1* 8.3* 8.2*  MG 1.3* 2.2 2.0  --   --  1.5*  PHOS 2.8  --  3.0  --   --   --    GFR: Estimated Creatinine Clearance: 109 mL/min (by C-G formula based on SCr of 0.94 mg/dL). Liver Function Tests: Recent Labs  Lab 09/28/17 0710 10/01/17 0304 10/02/17 0235  AST 29 42* 33  ALT 12* 29 24  ALKPHOS 78 81 83  BILITOT 1.1 1.6* 1.5*  PROT 5.7* 5.3* 5.4*  ALBUMIN 2.5* 2.6* 2.6*   No results  for input(s): LIPASE, AMYLASE in the last 168 hours. Recent Labs  Lab 09/28/17 0710 10/01/17 0304 10/02/17 0235 10/03/17 0239  AMMONIA 216* 80* 53* 59*   Coagulation Profile: Recent Labs  Lab 09/28/17 0710 09/28/17 1338 09/30/17 0513 10/01/17 0304 10/03/17 0239  INR 1.89 1.84 1.45 1.47 1.65   Cardiac Enzymes: No results for input(s): CKTOTAL, CKMB, CKMBINDEX, TROPONINI in the last 168 hours. BNP (last 3 results) No results for input(s): PROBNP in the last 8760 hours. HbA1C: No results for input(s): HGBA1C in the last 72 hours. CBG: Recent Labs  Lab 09/28/17 0737 09/28/17 1233 09/28/17 1937 09/29/17 0024 09/29/17 0352  GLUCAP 126* 132* 99 85 108*   Lipid Profile: No results for input(s): CHOL, HDL, LDLCALC, TRIG, CHOLHDL, LDLDIRECT in the last 72 hours. Thyroid Function Tests: No results for input(s): TSH, T4TOTAL, FREET4, T3FREE, THYROIDAB in the last 72 hours. Anemia Panel: No results for input(s): VITAMINB12, FOLATE, FERRITIN, TIBC, IRON, RETICCTPCT in the last 72 hours. Urine analysis:    Component Value Date/Time   COLORURINE STRAW (A) 04/16/2017 1412   APPEARANCEUR CLEAR 04/16/2017 1412   LABSPEC 1.003 (L) 04/16/2017 1412   PHURINE 7.0 04/16/2017 1412   GLUCOSEU NEGATIVE 04/16/2017 1412   HGBUR NEGATIVE 04/16/2017 1412   BILIRUBINUR NEGATIVE 04/16/2017 1412   BILIRUBINUR neg 05/17/2014 1736   KETONESUR NEGATIVE  04/16/2017 1412   PROTEINUR NEGATIVE 04/16/2017 1412   UROBILINOGEN 1.0 05/17/2014 1736   NITRITE NEGATIVE 04/16/2017 1412   LEUKOCYTESUR NEGATIVE 04/16/2017 1412   Sepsis Labs: @LABRCNTIP (procalcitonin:4,lacticidven:4)  ) Recent Results (from the past 240 hour(s))  MRSA PCR Screening     Status: None   Collection Time: 09/28/17 10:56 AM  Result Value Ref Range Status   MRSA by PCR NEGATIVE NEGATIVE Final    Comment:        The GeneXpert MRSA Assay (FDA approved for NASAL specimens only), is one component of a comprehensive MRSA colonization surveillance program. It is not intended to diagnose MRSA infection nor to guide or monitor treatment for MRSA infections.   Aerobic/Anaerobic Culture (surgical/deep wound)     Status: None (Preliminary result)   Collection Time: 10/02/17 10:44 AM  Result Value Ref Range Status   Specimen Description ABSCESS LEFT SHOULDER  Final   Special Requests POF DOXICYCLINE  Final   Gram Stain   Final    ABUNDANT WBC PRESENT, PREDOMINANTLY PMN ABUNDANT GRAM POSITIVE COCCI IN PAIRS RARE GRAM NEGATIVE RODS    Culture PENDING  Incomplete   Report Status PENDING  Incomplete         Radiology Studies: No results found.      Scheduled Meds: . Adalimumab  40 mg Subcutaneous Weekly  . chlorhexidine  15 mL Mouth Rinse BID  . doxycycline  100 mg Oral Q12H  . folic acid  1 mg Oral Daily  . ISOtretinoin  40 mg Oral BID  . lactulose  10 g Oral TID  . mouth rinse  15 mL Mouth Rinse q12n4p  . nicotine  21 mg Transdermal Daily  . pantoprazole  40 mg Intravenous Q12H  . propranolol  10 mg Oral BID  . thiamine  100 mg Oral Daily  . traZODone  100 mg Oral QHS   Continuous Infusions: . sodium chloride 10 mL/hr at 10/02/17 2138  . lactated ringers       LOS: 5 days    Time spent: 40 minutes    Jessabelle Markiewicz, Roselind Messier, MD [facility] Pager [phone]   If 7PM-7AM,  please contact night-coverage [email or web address] Password  [facility] 10/03/2017, 8:36 AM

## 2017-10-03 NOTE — Progress Notes (Signed)
[facility] Progress Note  1 Day Post-Op  Subjective: CC:  Pain controlled. Patient anxious for discharge.   Objective: Vital signs in last 24 hours: Temp:  [97.3 F (36.3 C)-97.8 F (36.6 C)] 97.7 F (36.5 C) (11/08 0741) Pulse Rate:  [57-100] 81 (11/08 0953) Resp:  [10-13] 10 (11/08 0340) BP: (121-146)/(68-94) 146/91 (11/08 0953) SpO2:  [93 %-99 %] 94 % (11/08 0340) Last BM Date: 09/29/17  Intake/Output from previous day: 11/07 0701 - 11/08 0700 In: 500 [I.V.:500] Out: 900 [Urine:850; Blood:50] Intake/Output this shift: Total I/O In: 240 [P.O.:240] Out: 0   PE: Gen:  Alert, NAD, pleasant Card:  Regular rate and rhythm, Pulm:  Normal effort Skin: left posterior shoulder wounds examined and re-packed at bedside. No significant bleeding. Erythema improved. No residual fluctuance.   Lab Results:  Recent Labs    10/02/17 0235 10/03/17 0921  WBC 8.0 10.2  HGB 8.2* 7.9*  HCT 23.6* 22.7*  PLT 120* 136*   BMET Recent Labs    10/02/17 0235 10/03/17 0239  NA 136 134*  K 3.9 4.4  CL 110 108  CO2 21* 20*  GLUCOSE 93 148*  BUN 9 10  CREATININE 1.04 0.94  CALCIUM 8.3* 8.2*   PT/INR Recent Labs    10/01/17 0304 10/03/17 0239  LABPROT 17.7* 19.4*  INR 1.47 1.65   CMP     Component Value Date/Time   NA 134 (L) 10/03/2017 0239   K 4.4 10/03/2017 0239   CL 108 10/03/2017 0239   CO2 20 (L) 10/03/2017 0239   GLUCOSE 148 (H) 10/03/2017 0239   BUN 10 10/03/2017 0239   CREATININE 0.94 10/03/2017 0239   CREATININE 1.73 (H) 10/20/2016 1549   CALCIUM 8.2 (L) 10/03/2017 0239   PROT 5.4 (L) 10/02/2017 0235   ALBUMIN 2.6 (L) 10/02/2017 0235   AST 33 10/02/2017 0235   ALT 24 10/02/2017 0235   ALKPHOS 83 10/02/2017 0235   BILITOT 1.5 (H) 10/02/2017 0235   GFRNONAA >60 10/03/2017 0239   GFRAA >60 10/03/2017 0239   Lipase  No results found for: LIPASE     Studies/Results: No results found.  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 Chevis PrettyPaul Toth  - follow cultures; G+ cocci in pairs/GNR  - patient with history of recurrent skin infections for > 20 years; diagnosed with hidradenitis and followed by dermatology and surgery at [facility] for medical management. - continue PO doxycycline for 7 days at discharge.   - plan to follow up with Dr. Carolynne Edouardoth. - wound care instructions provided. Patient is stable for discharge from surgical perscpective.  FEN: HH  ID: azithromycin 11/3-11/6, Doxycycline 11/6 >> VTE: SCD's, chemical VTE held 2/2 anemia     LOS: 5 days    Adam PhenixElizabeth S Simaan , [facility] 10/03/2017, 11:07  AM Pager: [phone] Consults: [phone] Mon-Fri 7:00 am-4:30 pm Sat-Sun 7:00 am-11:30 am

## 2017-10-03 NOTE — Progress Notes (Signed)
No GIB.  BUN nl, Hgb stable x days.  Feels well.  No diarr on low-dose lactulose. Off octreotide.  Alert, does serial 3's w/ occas errors, no asterixis.  No jaundice, abd w/out overt ascites.  Will change to oral protonix, no other suggestions at present.  Will check ammonia tomorrow, if still elev , ? Add rifaximin.  Florencia Reasonsobert V. Marcey Persad, M.D. Pager [phone] If no answer or after 5 PM call [phone]

## 2017-10-04 DIAGNOSIS — L732 Hidradenitis suppurativa: Secondary | ICD-10-CM

## 2017-10-04 DIAGNOSIS — L0291 Cutaneous abscess, unspecified: Secondary | ICD-10-CM

## 2017-10-04 LAB — BASIC METABOLIC PANEL
ANION GAP: 6 (ref 5–15)
BUN: 8 mg/dL (ref 6–20)
CHLORIDE: 107 mmol/L (ref 101–111)
CO2: 23 mmol/L (ref 22–32)
Calcium: 8.4 mg/dL — ABNORMAL LOW (ref 8.9–10.3)
Creatinine, Ser: 0.9 mg/dL (ref 0.61–1.24)
GFR calc non Af Amer: >60 mL/min (ref >60)
Glucose, Bld: 104 mg/dL — ABNORMAL HIGH (ref 65–99)
Potassium: 3.6 mmol/L (ref 3.5–5.1)
Sodium: 136 mmol/L (ref 135–145)

## 2017-10-04 LAB — MAGNESIUM: Magnesium: 1.8 mg/dL (ref 1.7–2.4)

## 2017-10-04 LAB — PROTIME-INR
INR: 1.56
Prothrombin Time: 18.5 seconds — ABNORMAL HIGH (ref 11.4–15.2)

## 2017-10-04 LAB — AMMONIA: Ammonia: 49 umol/L — ABNORMAL HIGH (ref 9–35)

## 2017-10-04 MED ORDER — SULFAMETHOXAZOLE-TRIMETHOPRIM 800-160 MG PO TABS: 1.0000 | ORAL_TABLET | Freq: Two times a day (BID) | ORAL | 0 refills | Status: DC

## 2017-10-04 MED ORDER — TRAMADOL HCL 50 MG PO TABS: 50.0000 mg | ORAL_TABLET | Freq: Four times a day (QID) | ORAL | 0 refills | Status: DC | PRN

## 2017-10-04 MED ORDER — ACETAMINOPHEN 500 MG PO TABS: 500.0000 mg | ORAL_TABLET | Freq: Four times a day (QID) | ORAL | 0 refills | Status: DC | PRN

## 2017-10-04 MED ORDER — DOXYCYCLINE HYCLATE 100 MG PO TABS: 100.0000 mg | ORAL_TABLET | Freq: Two times a day (BID) | ORAL | 0 refills | Status: DC

## 2017-10-04 MED ORDER — LACTULOSE 10 GM/15ML PO SOLN: 10.0000 g | Freq: Three times a day (TID) | ORAL | 0 refills | Status: DC

## 2017-10-04 MED ORDER — SULFAMETHOXAZOLE-TRIMETHOPRIM 800-160 MG PO TABS
1.0000 | ORAL_TABLET | Freq: Two times a day (BID) | ORAL | Status: DC
  Administered 2017-10-04: 1 via ORAL
  Filled 2017-10-04 (×2): qty 1

## 2017-10-04 MED ORDER — OXYCODONE HCL 5 MG PO CAPS: 5.0000 mg | ORAL_CAPSULE | ORAL | 0 refills | Status: DC | PRN

## 2017-10-04 NOTE — Discharge Instructions (Signed)
WOUND CARE: - If for some reason wound VAC is discontinued, change larger shoulder wound twice daily; smaller wound 1-2 times daily. - supplies: sterile saline, kerlix (role of gauze), 4x4 gauze, scissors, ABD pads, tape  - remove dressing and all packing carefully, moistening with normal or sterile saline as needed to avoid packing/internal dressing sticking to the wound. - clean edges of skin around the wound with water/gauze, making sure there is no tape debris or leakage left on skin that could cause skin irritation or breakdown. - dampen a clean kerlix or gauze with normal/sterile saline and pack wound from wound base to skin level, making sure to take note of any possible areas of wound tracking/tunneling and packing appropriately. Wound can be packed loosely. Trim kerlix to size if a whole kerlix is not required. - cover wound with a dry ABD pad or gauze and secure with tape.  - write the date/time on the dry dressing/tape to better track when the last dressing change occurred. - apply any skin protectant/powder recommended by clinician to protect skin/skin folds. - change dressing as needed if leakage occurs, wound gets contaminated, or patient requests to shower. - patient may shower daily with wound open and following the shower the wound should be dried and a clean dressing placed.      Gastrointestinal Bleeding Gastrointestinal bleeding is bleeding somewhere along the path food travels through the body (digestive tract). This path is anywhere between the mouth and the opening of the butt (anus). You may have blood in your poop (stools) or have black poop. If you throw up (vomit), there may be blood in it. This condition can be mild, serious, or even life-threatening. If you have a lot of bleeding, you may need to stay in the hospital. Follow these instructions at home:  Take over-the-counter and prescription medicines only as told by your doctor.  Eat foods that have a lot of fiber  in them. These foods include whole grains, fruits, and vegetables. You can also try eating 1-3 prunes each day.  Drink enough fluid to keep your pee (urine) clear or pale yellow.  Keep all follow-up visits as told by your doctor. This is important. Contact a doctor if:  Your symptoms do not get better. Get help right away if:  Your bleeding gets worse.  You feel dizzy or you pass out (faint).  You feel weak.  You have very bad cramps in your back or belly (abdomen).  You pass large clumps of blood (clots) in your poop.  Your symptoms are getting worse. This information is not intended to replace advice given to you by your health care provider. Make sure you discuss any questions you have with your health care provider. Document Released: 08/21/2008 Document Revised: 04/19/2016 Document Reviewed: 05/02/2015 Elsevier Interactive Patient Education  2018 [employer]  Hepatic Encephalopathy Hepatic encephalopathy is a loss of brain function from advanced liver disease. The effects of the condition depend on the type of liver damage and how severe it is. In some cases, hepatic encephalopathy can be reversed. What are the causes? The exact cause of hepatic encephalopathy is not known. What increases the risk? You have a higher risk of getting this condition if your liver is damaged. When the liver is damaged harmful substances called toxins can build up in the body. Certain toxins, such as ammonia, can harm your brain. Conditions that can cause liver damage include:  An infection.  Dehydration.  Intestinal bleeding.  Drinking too much alcohol.  Taking certain medicines, including tranquilizers, water pills (diuretics), antidepressants, or sleeping pills.  What are the signs or symptoms? Signs and symptoms may develop suddenly. Or, they may develop slowly and get worse gradually. Symptoms can range from mild to severe. Mild Hepatic Encephalopathy  Mild  confusion.  Personality and mood changes.  Anxiety and agitation.  Drowsiness.  Loss of mental abilities.  Musty or sweet-smelling breath. Worsening or Severe Hepatic Encephalopathy  Slowed movement.  Slurred speech.  Extreme personality changes.  Disorientation.  Abnormal shaking or flapping of the hands.  Coma. How is this diagnosed? To make a diagnosis, your health care provider will do a physical exam. To rule out other causes of your signs and symptoms, he or she may order tests. You may have:  Blood tests. These may be done to check your ammonia level, measure how long it takes your blood to clot, and check for infection.  Liver function tests. These may be done to check how well your liver is working.  MRI and CT scans. These may be done to check for a brain disorder.  Electroencephalogram (EEG). This may be done to measure the electrical activity in your brain.  How is this treated? The first step in treatment is identifying and treating possible triggers. The next step is involves taking medicine to lower the level of toxins in the body and to prevent ammonia from building up. You may need to take:  Antibiotics to reduce the ammonia-producing bacteria in your gut.  Lactulose to help flush ammonia from the gut.  Follow these instructions at home: Eating and drinking  Follow a low-protein diet that includes plenty of fruits, vegetables, and whole grains, as directed by your health care provider. Ammonia is produced when you digest high-protein foods.  Work with a Data processing managerdietitian or with your health care provider to make sure you are getting the right balance of protein and minerals.  Drink enough fluids to keep your urine clear or pale yellow. Drinking plenty of water helps prevent constipation.  Do not drink alcohol or use illegal drugs. Medicines  Only take medicine as directed by your health care provider.  If you were prescribed an antibiotic medicine,  finish it all even if you start to feel better.  Do not start any new medicines, including over-the-counter medicines, without first checking with your health care provider. Contact a health care provider if:  You have new symptoms.  Your symptoms change.  Your symptoms get worse.  You have a fever.  You are constipated.  You have persistent nausea, vomiting, or diarrhea. Get help right away if:  You become very confused or drowsy.  You vomit blood or material that looks like coffee grounds.  Your stool is bloody or black or looks like tar. This information is not intended to replace advice given to you by your health care provider. Make sure you discuss any questions you have with your health care provider. Document Released: 01/22/2007 Document Revised: 04/19/2016 Document Reviewed: 06/30/2014 Elsevier Interactive Patient Education  2018 [employer]

## 2017-10-04 NOTE — Discharge Summary (Addendum)
DISCHARGE SUMMARY  Michael [name]  MR#: [id number]  DOB:November 05, 1966  Date of Admission: 09/28/2017 Date of Discharge: 10/04/2017  Attending Physician:Michael [name]  Patient's GEX:[id number], No Pcp Per  Consults: [facility]   Disposition: D/C home   Follow-up Appts: Follow-up Information    Michael Miner, MD Follow up.   Specialty:  General Surgery Why:  call as soon as possible to schedule a follow-up appointment with Dr. Chevis [name]  appointment in 2 weeks. Contact information: [address] [place] [address] [phone]        Michael Minks, NP Follow up in 7 day(s).   Specialty:  Nurse Practitioner Contact information: [address] [place] [address] [phone]        [facility] - Dme Follow up.   Why:  wound vac Contact information: [address] [place] [address] [phone]        [facility], [facility] Follow up.   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 Patient had no recollection of the event. He was found in a puddle of dark blood and when EMS set him up he became unresponsive. Blood pressure in the field was extremely low.  Hospital Course:  11/3 admit  11/3 EGD w/ banding and lesion  injection 11/5 [facility] assumed care   11/7 I&D of R and L shoulder/back abscesses in OR   GI Bleed bleeding Dieulafoy lesion s/p epi injection and hemoclipping X 1 and large esophageal varices with bleeding stigmata s/p banding X 2 - GI following - octreotide continued for 4 days of tx - protonix gtt was utilized for 72hrs of tx - no evidence of active bleeding at time of d/c - to cont PPI    Acute blood loss anemia Due to above - Hgb holding steady at time of d/c w/ no evidence of ongoing bleeding   Alcoholic cirrhosis - hepatic encephalopathy  Ammonia quite elevated during hospital stay - began lactulose w/ significant improvement in mental status  Coagulopathy due to liver disease INR stable at ~1.5   Hypomag Corrected w/ replacement   Metabolic acidosis Resolved prior to d/c home - suspect due to hypoperfusion and acute renal injury   Alcohol abuse Counseled at length on the absolute need to abstain from EtOH   Hydradenitis - Shoulder abscesses - Staph lugdunensis on culture  Gen Surg consulted & performed I&D of multiple areas on R and L posterior shoulder regions - Doxycycline to continue through 10/13 - wound care as prescribed and arranged by Gen Surgery   Allergies as of 10/04/2017      Reactions   Penicillins Rash   Has patient had a PCN reaction causing immediate rash, facial/tongue/throat swelling, SOB or lightheadedness with hypotension: yes Has patient had a PCN reaction causing severe rash involving mucus membranes or skin necrosis: no Has patient  had a PCN reaction that required hospitalization: no Has patient had a PCN reaction occurring within the last 10 years: no If all of the above answers are "NO", then may proceed with Cephalosporin use.   Latex Rash      Medication List    STOP taking these medications   CENTRUM SILVER 50+MEN PO   gabapentin 100 MG capsule Commonly known as:  NEURONTIN   HYDROcodone-acetaminophen 7.5-325 MG tablet Commonly  known as:  NORCO   HYDROcodone-ibuprofen 7.5-200 MG tablet Commonly known as:  VICOPROFEN     TAKE these medications   acetaminophen 500 MG tablet Commonly known as:  TYLENOL Take 1 tablet (500 mg total) every 6 (six) hours as needed by mouth for mild pain, fever or headache.   amphetamine-dextroamphetamine 20 MG tablet Commonly known as:  ADDERALL Take 20 mg by mouth 2 (two) times daily.   BIOTRUE Soln Place 1 drop into both eyes daily.   CIALIS 5 MG tablet Generic drug:  tadalafil Take 5 mg by mouth daily as needed for erectile dysfunction.   dapsone 25 MG tablet Take 50 mg by mouth daily.   EPINEPHrine 0.3 mg/0.3 mL Soaj injection Commonly known as:  EPI-PEN Inject 0.3 mg into the muscle as needed (for anaphylaxis shock).   ferrous gluconate 324 MG tablet Commonly known as:  FERGON Take 1 tablet (324 mg total) by mouth 2 (two) times daily with a meal.   HUMIRA 40 MG/0.8ML Pskt Generic drug:  Adalimumab Inject 40 mg into the skin once a week.   hydrOXYzine 25 MG tablet Commonly known as:  ATARAX/VISTARIL Take 25 mg by mouth 3 (three) times daily as needed for itching.   ibuprofen 200 MG tablet Commonly known as:  ADVIL,MOTRIN Take 200 mg by mouth every 8 (eight) hours as needed for mild pain.   ISOtretinoin 40 MG capsule Commonly known as:  ACCUTANE Take 40 mg by mouth 2 (two) times daily.   lactulose 10 GM/15ML solution Commonly known as:  CHRONULAC Take 15 mLs (10 g total) 3 (three) times daily by mouth.   levocetirizine 5 MG tablet Commonly known as:  XYZAL Take 5 mg by mouth every evening.   lisinopril-hydrochlorothiazide 20-12.5 MG tablet Commonly known as:  PRINZIDE,ZESTORETIC Take 2 tablets by mouth daily.   methocarbamol 500 MG tablet Commonly known as:  ROBAXIN Take 1 tablet (500 mg total) by mouth every 8 (eight) hours as needed for muscle spasms.   multivitamin with minerals Tabs tablet Take 1 tablet by mouth daily.   omeprazole 20 MG  capsule Commonly known as:  PRILOSEC Take 1 capsule (20 mg total) by mouth 2 (two) times daily.   oxycodone 5 MG capsule Commonly known as:  OXY-IR Take 1 capsule (5 mg total) every 4 (four) hours as needed by mouth (as needed only for severe pain). What changed:    reasons to take this  Another medication with the same name was removed. Continue taking this medication, and follow the directions you see here.   potassium chloride 10 MEQ CR capsule Commonly known as:  MICRO-K Take 10 mEq by mouth daily.   PROAIR HFA 108 (90 Base) MCG/ACT inhaler Generic drug:  albuterol Inhale 2 puffs into the lungs every 4 (four) hours as needed for wheezing or shortness of breath.   propranolol 10 MG tablet Commonly known as:  INDERAL Take 10 mg by mouth 2 (two) times daily.   SSD 1 % cream Generic drug:  silver sulfADIAZINE Apply 1  application topically daily as needed (shoulder).   sulfamethoxazole-trimethoprim 800-160 MG tablet Commonly known as:  BACTRIM DS,SEPTRA DS Take 1 tablet every 12 (twelve) hours by mouth.   SYSTANE ULTRA 0.4-0.3 % Soln Generic drug:  Polyethyl Glycol-Propyl Glycol Place 1-2 drops into both eyes 3 (three) times daily as needed (for dry eyes).   traMADol 50 MG tablet Commonly known as:  ULTRAM Take 1 tablet (50 mg total) every 6 (six) hours as needed by mouth for moderate pain.   traZODone 100 MG tablet Commonly known as:  DESYREL Take 200 mg by mouth at bedtime.   zolpidem 10 MG tablet Commonly known as:  AMBIEN Take 15 mg by mouth at bedtime. What changed:  Another medication with the same name was removed. Continue taking this medication, and follow the directions you see here.       Day of Discharge BP 138/89 (BP Location: Left Arm)   Pulse 86   Temp 99.3 F (37.4 C) (Oral)   Resp 17   Ht 5\' 11"  (1.803 m)   Wt 92.1 kg (203 lb 0.7 oz)   SpO2 97%   BMI 28.32 kg/m   Physical Exam: General: No acute respiratory distress Lungs: Clear to  auscultation bilaterally without wheezes or crackles Cardiovascular: Regular rate and rhythm without murmur gallop or rub normal S1 and S2 Abdomen: Nontender, nondistended, soft, bowel sounds positive, no rebound, no ascites, no appreciable mass Extremities: No significant cyanosis, clubbing, or edema bilateral lower extremities  Basic Metabolic Panel: Recent Labs  Lab 09/29/17 0545 09/30/17 0001 09/30/17 0513 10/01/17 0304 10/02/17 0235 10/03/17 0239 10/04/17 0344  NA 140  --  139 137 136 134* 136  K 4.9  --  4.2 3.9 3.9 4.4 3.6  CL 117*  --  115* 115* 110 108 107  CO2 16*  --  18* 18* 21* 20* 23  GLUCOSE 112*  --  98 89 93 148* 104*  BUN 33*  --  23* 13 9 10 8   CREATININE 1.50*  --  1.14 1.02 1.04 0.94 0.90  CALCIUM 8.0*  --  8.1* 8.1* 8.3* 8.2* 8.4*  MG 1.3* 2.2 2.0  --   --  1.5* 1.8  PHOS 2.8  --  3.0  --   --   --   --     Liver Function Tests: Recent Labs  Lab 09/28/17 0710 10/01/17 0304 10/02/17 0235  AST 29 42* 33  ALT 12* 29 24  ALKPHOS 78 81 83  BILITOT 1.1 1.6* 1.5*  PROT 5.7* 5.3* 5.4*  ALBUMIN 2.5* 2.6* 2.6*   No results for input(s): LIPASE, AMYLASE in the last 168 hours. Recent Labs  Lab 09/28/17 0710 10/01/17 0304 10/02/17 0235 10/03/17 0239 10/04/17 0344  AMMONIA 216* 80* 53* 59* 49*    Coags: Recent Labs  Lab 09/28/17 1338 09/30/17 0513 10/01/17 0304 10/03/17 0239 10/04/17 0344  INR 1.84 1.45 1.47 1.65 1.56    CBC: Recent Labs  Lab 09/28/17 0710 09/28/17 1249  09/30/17 0917 09/30/17 1535 10/01/17 0304 10/02/17 0235 10/03/17 0921  WBC 20.9* 13.9*   < > 6.7 7.6 7.0 8.0 10.2  NEUTROABS 15.5* 9.4*  --   --   --   --   --   --   HGB 6.9* 9.6*   < > 8.2* 8.2* 8.1* 8.2* 7.9*  HCT 20.4* 27.7*   < > 23.9* 23.9* 23.7* 23.6* 22.7*  MCV 92.7 89.6   < > 89.5 89.5 89.8 89.4 89.7  PLT 238 121*   < > 117* 122* 122* 120* 136*   < > = values in this interval not displayed.    CBG: Recent Labs  Lab 09/28/17 0737 09/28/17 1233  09/28/17 1937 09/29/17 0024 09/29/17 0352  GLUCAP 126* 132* 99 85 108*    Recent Results (from the past 240 hour(s))  MRSA PCR Screening     Status: None   Collection Time: 09/28/17 10:56 AM  Result Value Ref Range Status   MRSA by PCR NEGATIVE NEGATIVE Final    Comment:        The GeneXpert MRSA Assay (FDA approved for NASAL specimens only), is one component of a comprehensive MRSA colonization surveillance program. It is not intended to diagnose MRSA infection nor to guide or monitor treatment for MRSA infections.   Aerobic/Anaerobic Culture (surgical/deep wound)     Status: None (Preliminary result)   Collection Time: 10/02/17 10:44 AM  Result Value Ref Range Status   Specimen Description ABSCESS LEFT SHOULDER  Final   Special Requests POF DOXICYCLINE  Final   Gram Stain   Final    ABUNDANT WBC PRESENT, PREDOMINANTLY PMN ABUNDANT GRAM POSITIVE COCCI IN PAIRS RARE GRAM NEGATIVE RODS    Culture   Final    ABUNDANT STAPHYLOCOCCUS LUGDUNENSIS NO ANAEROBES ISOLATED; CULTURE IN PROGRESS FOR 5 DAYS    Report Status PENDING  Incomplete   Organism ID, Bacteria STAPHYLOCOCCUS LUGDUNENSIS  Final      Susceptibility   Staphylococcus lugdunensis - MIC*    CIPROFLOXACIN <=0.5 SENSITIVE Sensitive     ERYTHROMYCIN >=8 RESISTANT Resistant     GENTAMICIN <=0.5 SENSITIVE Sensitive     OXACILLIN >=4 RESISTANT Resistant     TETRACYCLINE >=16 RESISTANT Resistant     VANCOMYCIN <=0.5 SENSITIVE Sensitive     TRIMETH/SULFA <=10 SENSITIVE Sensitive     CLINDAMYCIN >=8 RESISTANT Resistant     RIFAMPIN <=0.5 SENSITIVE Sensitive     Inducible Clindamycin NEGATIVE Sensitive     * ABUNDANT STAPHYLOCOCCUS LUGDUNENSIS     Time spent in discharge (includes decision making & examination of pt): 35 minutes  10/04/2017, 2:10 PM   Lonia Blood, MD [facility] Office  [phone] Pager [phone]  On-Call/Text Page:      [email or web address]      password [facility]

## 2017-10-04 NOTE — Consult Note (Addendum)
WOC Nurse wound consult note Reason for Consult: Consult requested to apply Vac to left posterior shoulder.  Surgical team has been following and performed debridement to 2 full thickness wounds.  Pt plans to discharge today, and discussed plan of care with home health agency and care manager.  [facility] nurse will bring a Medala pump after insurance has approved the process, and WOC will apply dressing and they will connect the machine.  Wound type: 2 full thickness wounds to left posterior shoulder; assessed to obtain measurements for negative pressure application.  Pt was medicated for pain earlier and tolerated dressing change with mod amt discomfort. Measurement: 10X11X1cm,  lower wound and 2X5.5X.4cm, upper wound.  Both sites are 90% beefy red, 10% yellow, mod amt tan drainage, no odor. Dressing procedure/placement/frequency: Applied moist gauze until Negative pressure machine is available this afternoon.  Reviewed plan of care with patient and he verbalized understanding. Adelfa Lozito MSN, RN,Cammie Mcgee CWOCN, [place], CNS [phone]

## 2017-10-04 NOTE — Care Management Note (Addendum)
Case Management Note  Patient Details  Name: Michael [name] MRN: [id number] Date of Birth: [birth year]  Subjective/Objective:   Pt  Admitted with GI bleed - s/p EDG with banding of varices - pt remains on ocreotide drip.  Pt also had I&D 11/7 for shoulder abscess  Action/Plan:  PTA independent from home.  Mom at bedside and will serve as support person at discharge.  CM will continue to follow for discharge needs   Expected Discharge Date:  10/04/17               Expected Discharge Plan:  Home/Self Care  In-House Referral:     Discharge planning Services  CM Consult  Post Acute Care Choice:    Choice offered to:     DME Arranged:  Vac DME Agency:  [place] Arranged:  RN [facility] Agency:  [place]  Status of Service:     If discussed at [place] of Stay Meetings, dates discussed:    Additional Comments: 10/04/2017   Unfortunately, wound vac was denied by [facility] due to lack of parameters being met. CM contacted [employer] and verified that depth parameter is in fact a barrier for [facility] approval for vac.   [facility] verified with insurance that the vac was denied due to;  Parameters of depth not met and wound is less than 77 days old.   [facility] discussed pt signing waver to be accountable of cost of vac per month -  - pt discussed cost and wavier requirement with partner and unfortunately they are not able to afford the cost.   Bedside Nurse contacted surgery and they are in agreement for pt to go home with wet to dry dressings - bedside nurse to provide teaching to pt/partner/mom.  [facility] will provided [facility] for wound care   Pt and partner now voicing concerns with home care of wound - wound vac will be applied prior to discharge.  CM offered surgery and pt choice for equipment- consensus was for [facility] - pt also chose [facility] for [facility];  agency contacted and referral accepted - agency informed of discharge today  10/03/17 Per bedside nurse pt will go home with wet to dry dressing  changes to back - partners states that he can perform dressing changes if instructed to do so- per bedside nurse pt may need wound vac in the future.  Pt has PCP Merla Riches with [facility] - pt denied barriers to obtaining and paying for medications as prescribed Maryclare Labrador, RN 10/04/2017, 10:43 AM

## 2017-10-04 NOTE — Consult Note (Addendum)
WOC Nurse wound follow up Discussed plan of care with [facility] representative via phone.  Pt's insurance declined the approval of a negative pressure device, and pt will discharge with moist gauze dressings. Bedside nurse and patient are aware of the change to the plan of care.  Please refer to the surgical team for further questions.  Please re-consult if further assistance is needed.  Thank-you,  Cammie Mcgeeawn Jazel Nimmons MSN, RN, CWOCN, [place], CNS [phone]

## 2017-10-04 NOTE — Progress Notes (Signed)
Pt going home today.  Hgb is stable.  Pt is remarkably alert and coherent.  His partner and mother are at bedside; pt is dressed and ready to go home.  Advised:  1. NO alcohol--rationale discussed, w/ respect to portal HTN 2. Minimize NSAID's to the degree possible; acetaminophen ok for occas use 3. Contact our office (card provided) to arrange f/u w/ Dr. Bosie ClosSchooler (pt wants to [facility] [place])--suggested timeframe about 2 wks  Time w/ pt (education and counselling) aprrox 25 minutes  Michael [name], M.D. Pager [phone] If no answer or after 5 PM call [phone]

## 2017-10-04 NOTE — Progress Notes (Signed)
Discharge instructions given to patient, all questions answered at this time.  Home prescriptions returned to patient from pharmacy.  Pt. VSS with no s/s of distress noted.  Patient stable at discharge.

## 2017-10-04 NOTE — Progress Notes (Signed)
[facility] Progress Note  2 Days Post-Op  Subjective: CC:  Pain controlled. Tolerating PO. Having bowel function. Reports his partner is NOT comfortable with performing wound care at home and requesting home health. We again discussed the wound VAC and he would like to try it.   Objective: Vital signs in last 24 hours: Temp:  [97.5 F (36.4 C)-98.6 F (37 C)] 98.6 F (37 C) (11/09 0551) Pulse Rate:  [78-94] 83 (11/09 0551) Resp:  [16-17] 17 (11/09 0551) BP: (134-150)/(79-91) 150/79 (11/09 0551) SpO2:  [95 %-100 %] 95 % (11/09 0551) Last BM Date: 10/03/17  Intake/Output from previous day: 11/08 0701 - 11/09 0700 In: 763.3 [P.O.:740; I.V.:23.3] Out: 1371 [Urine:1370; Stool:1] Intake/Output this shift: No intake/output data recorded.  PE: Gen:  Alert, NAD, pleasant Card:  Regular rate and rhythm, Pulm:  Normal effort Skin: dressings c/d/i    Lab Results:  Recent Labs    10/02/17 0235 10/03/17 0921  WBC 8.0 10.2  HGB 8.2* 7.9*  HCT 23.6* 22.7*  PLT 120* 136*   BMET Recent Labs    10/03/17 0239 10/04/17 0344  NA 134* 136  K 4.4 3.6  CL 108 107  CO2 20* 23  GLUCOSE 148* 104*  BUN 10 8  CREATININE 0.94 0.90  CALCIUM 8.2* 8.4*   PT/INR Recent Labs    10/03/17 0239 10/04/17 0344  LABPROT 19.4* 18.5*  INR 1.65 1.56   CMP     Component Value Date/Time   NA 136 10/04/2017 0344   K 3.6 10/04/2017 0344   CL 107 10/04/2017 0344   CO2 23 10/04/2017 0344   GLUCOSE 104 (H) 10/04/2017 0344   BUN 8 10/04/2017 0344   CREATININE 0.90 10/04/2017 0344   CREATININE 1.73 (H) 10/20/2016 1549   CALCIUM 8.4 (L) 10/04/2017 0344   PROT 5.4 (L) 10/02/2017 0235   ALBUMIN 2.6 (L) 10/02/2017 0235   AST 33 10/02/2017 0235   ALT 24 10/02/2017 0235   ALKPHOS 83 10/02/2017 0235   BILITOT 1.5 (H) 10/02/2017 0235   GFRNONAA >60 10/04/2017 0344   GFRAA >60 10/04/2017 0344   Lipase  No results found for: LIPASE     Studies/Results: No results  found.  Anti-infectives: Anti-infectives (From admission, onward)   Start     Dose/Rate Route Frequency Ordered Stop   10/01/17 1200  doxycycline (VIBRA-TABS) tablet 100 mg     100 mg Oral Every 12 hours 10/01/17 1106     09/28/17 0845  azithromycin (ZITHROMAX) 500 mg in dextrose 5 % 250 mL IVPB  Status:  Discontinued     500 mg 250 mL/hr over 60 Minutes Intravenous Every 24 hours 09/28/17 0836 10/01/17 1106       Assessment/Plan GIB  ABL anemia - hgb 8.1, 23.7, platelets 122 Alcoholic cirrhosis w/ associated encephalopathy - ammonia 80 Coagulopathy 2/2 liver disease - INR 1.47, PT 17.7  Left shoulder abscess - S/P I&D 10/02/17 Dr. Chevis PrettyPaul Toth  - follow cultures; ABUNDANT STAPHYLOCOCCUS LUGDUNENSIS >> sensitivities pending   - patient with history of recurrent skin infections for > 20 years; diagnosed with hidradenitis and followed by dermatology and surgery at [facility] for medical management. - [facility]. wound care instructions provided. HH ordered. Patient is stable for discharge from surgical perscpective. - plan to follow up with Dr. Carolynne Edouardoth in 2 weeks.  FEN: HH  ID: azithromycin 11/3-11/6, Doxycycline 11/6 >>; continue PO doxycycline through 11/13 (or change abx based on sensitivities).  VTE:  SCD's, chemical VTE held 2/2 anemia    LOS: 6 days    Adam PhenixElizabeth S Simaan , [facility] 10/04/2017, 7:45 AM Pager: [phone] Consults: [phone] Mon-Fri 7:00 am-4:30 pm Sat-Sun 7:00 am-11:30 am

## 2017-10-07 LAB — AEROBIC/ANAEROBIC CULTURE (SURGICAL/DEEP WOUND)

## 2017-10-07 LAB — AEROBIC/ANAEROBIC CULTURE W GRAM STAIN (SURGICAL/DEEP WOUND)

## 2018-02-26 ENCOUNTER — Encounter (INDEPENDENT_AMBULATORY_CARE_PROVIDER_SITE_OTHER): Payer: Self-pay | Admitting: Specialist

## 2018-02-26 ENCOUNTER — Ambulatory Visit (INDEPENDENT_AMBULATORY_CARE_PROVIDER_SITE_OTHER): Payer: BLUE CROSS/BLUE SHIELD | Admitting: Specialist

## 2018-02-26 VITALS — BP 117/69 | HR 94 | Ht 71.0 in | Wt 194.0 lb

## 2018-02-26 DIAGNOSIS — M48062 Spinal stenosis, lumbar region with neurogenic claudication: Secondary | ICD-10-CM | POA: Diagnosis not present

## 2018-02-26 DIAGNOSIS — M47816 Spondylosis without myelopathy or radiculopathy, lumbar region: Secondary | ICD-10-CM | POA: Diagnosis not present

## 2018-02-26 DIAGNOSIS — M5136 Other intervertebral disc degeneration, lumbar region: Secondary | ICD-10-CM | POA: Diagnosis not present

## 2018-02-26 MED ORDER — OXYCODONE HCL 5 MG PO CAPS: 5.0000 mg | ORAL_CAPSULE | Freq: Four times a day (QID) | ORAL | 0 refills | Status: DC | PRN
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
## 2018-02-26 NOTE — Patient Instructions (Addendum)
Avoid bending, stooping and avoid lifting weights greater than 10 lbs. Avoid prolong standing and walking. Avoid frequent bending and stooping  No lifting greater than 10 lbs. May use ice or moist heat for pain. Weight loss is of benefit. Handicap license is approved. Dr. Piute BlasNewton's secretary/Assistant will call to arrange for epidural steroid injection  Due to GI bleed no tylenol or NSAIDs, Oxycodone for pain One week supply. A spinal cord stimulator a consideration.

## 2018-02-27 ENCOUNTER — Telehealth (INDEPENDENT_AMBULATORY_CARE_PROVIDER_SITE_OTHER): Payer: Self-pay | Admitting: Specialist

## 2018-02-27 NOTE — Telephone Encounter (Signed)
Patient came in to drop off Jury Duty Form to be accompanied by a letter to the courts to be written and signed by Dr.Nitka. Patient wants to be called once letter is finished so he can pick it up.

## 2018-03-13 ENCOUNTER — Encounter (INDEPENDENT_AMBULATORY_CARE_PROVIDER_SITE_OTHER): Payer: Self-pay | Admitting: Physical Medicine and Rehabilitation

## 2018-03-13 ENCOUNTER — Other Ambulatory Visit (INDEPENDENT_AMBULATORY_CARE_PROVIDER_SITE_OTHER): Payer: Self-pay | Admitting: Radiology

## 2018-03-13 ENCOUNTER — Ambulatory Visit (INDEPENDENT_AMBULATORY_CARE_PROVIDER_SITE_OTHER): Payer: BLUE CROSS/BLUE SHIELD

## 2018-03-13 ENCOUNTER — Ambulatory Visit (INDEPENDENT_AMBULATORY_CARE_PROVIDER_SITE_OTHER): Payer: BLUE CROSS/BLUE SHIELD | Admitting: Physical Medicine and Rehabilitation

## 2018-03-13 VITALS — BP 103/63 | HR 83 | Temp 98.3°F

## 2018-03-13 DIAGNOSIS — M961 Postlaminectomy syndrome, not elsewhere classified: Secondary | ICD-10-CM | POA: Diagnosis not present

## 2018-03-13 DIAGNOSIS — M5416 Radiculopathy, lumbar region: Secondary | ICD-10-CM | POA: Diagnosis not present

## 2018-03-13 MED ORDER — OXYCODONE HCL 5 MG PO CAPS: 5.0000 mg | ORAL_CAPSULE | Freq: Four times a day (QID) | ORAL | 0 refills | Status: DC | PRN

## 2018-03-13 MED ORDER — BETAMETHASONE SOD PHOS & ACET 6 (3-3) MG/ML IJ SUSP
12.0000 mg | Freq: Once | INTRAMUSCULAR | Status: AC
  Administered 2018-03-13: 12 mg

## 2018-03-13 NOTE — Progress Notes (Signed)
 .  Numeric Pain Rating Scale and Functional Assessment Average Pain 4   In the last MONTH (on 0-10 scale) has pain interfered with the following?  1. General activity like being  able to carry out your everyday physical activities such as walking, climbing stairs, carrying groceries, or moving a chair?  Rating(4)   +Driver, -BT, +Dye Allergies(contrast media).

## 2018-03-13 NOTE — Patient Instructions (Signed)

## 2018-03-13 NOTE — Telephone Encounter (Signed)
Please call patients partner Jonny RuizJohn when rx for oxycodone is ready. He wants to pick it up today before the weekend. Call # [phone]
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
## 2018-03-24 NOTE — Procedures (Signed)
Lumbosacral Transforaminal Epidural Steroid Injection - Sub-Pedicular Approach with Fluoroscopic Guidance  Patient: Michael [name]      Date of Birth: Feb 08, 1966 MRN: [id number] PCP: Patient, No Pcp Per      Visit Date: 03/13/2018   Universal Protocol:    Date/Time: 03/13/2018  Consent Given By: the patient  Position: PRONE  Additional Comments: Vital signs were monitored before and after the procedure. Patient was prepped and draped in the usual sterile fashion. The correct patient, procedure, and site was verified.   Injection Procedure Details:  Procedure Site One Meds Administered:  Meds ordered this encounter  Medications  . betamethasone acetate-betamethasone sodium phosphate (CELESTONE) injection 12 mg    Laterality: Left  Location/Site:  L4-L5  Needle size: 22 G  Needle type: Spinal  Needle Placement: Transforaminal  Findings:    -Comments: Excellent flow of contrast along the nerve and into the epidural space.  Procedure Details: After squaring off the end-plates to get a true AP view, the C-arm was positioned so that an oblique view of the foramen as noted above was visualized. The target area is just inferior to the "nose of the scotty dog" or sub pedicular. The soft tissues overlying this structure were infiltrated with 2-3 ml. of 1% Lidocaine without Epinephrine.  The spinal needle was inserted toward the target using a "trajectory" view along the fluoroscope beam.  Under AP and lateral visualization, the needle was advanced so it did not puncture dura and was located close the 6 O'Clock position of the pedical in AP tracterory. Biplanar projections were used to confirm position. Aspiration was confirmed to be negative for CSF and/or blood. A 1-2 ml. volume of Isovue-250 was injected and flow of contrast was noted at each level. Radiographs were obtained for documentation purposes.   After attaining the desired flow of contrast documented above, a 0.5 to 1.0  ml test dose of 0.25% Marcaine was injected into each respective transforaminal space.  The patient was observed for 90 seconds post injection.  After no sensory deficits were reported, and normal lower extremity motor function was noted,   the above injectate was administered so that equal amounts of the injectate were placed at each foramen (level) into the transforaminal epidural space.   Additional Comments:  The patient tolerated the procedure well Dressing: Band-Aid    Post-procedure details: Patient was observed during the procedure. Post-procedure instructions were reviewed.  Patient left the clinic in stable condition.  Lumbar Epidural Steroid Injection - Interlaminar Approach with Fluoroscopic Guidance  Patient: Michael [name]      Date of Birth: Jan 10, 1966 MRN: [id number] PCP: Patient, No Pcp Per      Visit Date: 03/13/2018   Universal Protocol:     Consent Given By: the patient  Position: PRONE  Additional Comments: Vital signs were monitored before and after the procedure. Patient was prepped and draped in the usual sterile fashion. The correct patient, procedure, and site was verified.   Injection Procedure Details:  Procedure Site One Meds Administered:  Meds ordered this encounter  Medications  . betamethasone acetate-betamethasone sodium phosphate (CELESTONE) injection 12 mg     Laterality: Right  Location/Site:  L5-S1  Needle size: 20 G  Needle type: Tuohy  Needle Placement: Paramedian epidural  Findings:   -Comments: Excellent flow of contrast into the epidural space.  Procedure Details: Using a paramedian approach from the side mentioned above, the region overlying the inferior lamina was localized under fluoroscopic visualization and the soft tissues overlying this structure  were infiltrated with 4 ml. of 1% Lidocaine without Epinephrine. The Tuohy needle was inserted into the epidural space using a paramedian approach.   The epidural space  was localized using loss of resistance along with lateral and bi-planar fluoroscopic views.  After negative aspirate for air, blood, and CSF, a 2 ml. volume of Isovue-250 was injected into the epidural space and the flow of contrast was observed. Radiographs were obtained for documentation purposes.    The injectate was administered into the level noted above.   Additional Comments:  The patient tolerated the procedure well Dressing: Band-Aid    Post-procedure details: Patient was observed during the procedure. Post-procedure instructions were reviewed.  Patient left the clinic in stable condition.

## 2018-03-31 ENCOUNTER — Ambulatory Visit (INDEPENDENT_AMBULATORY_CARE_PROVIDER_SITE_OTHER): Payer: BLUE CROSS/BLUE SHIELD | Admitting: Specialist

## 2018-03-31 ENCOUNTER — Encounter (INDEPENDENT_AMBULATORY_CARE_PROVIDER_SITE_OTHER): Payer: Self-pay | Admitting: Specialist

## 2018-03-31 VITALS — BP 113/70 | HR 77 | Ht 71.0 in | Wt 194.0 lb

## 2018-03-31 DIAGNOSIS — M48062 Spinal stenosis, lumbar region with neurogenic claudication: Secondary | ICD-10-CM | POA: Diagnosis not present

## 2018-03-31 DIAGNOSIS — M5136 Other intervertebral disc degeneration, lumbar region: Secondary | ICD-10-CM | POA: Diagnosis not present

## 2018-03-31 MED ORDER — HYDROCODONE-ACETAMINOPHEN 10-325 MG PO TABS: 1.0000 | ORAL_TABLET | Freq: Four times a day (QID) | ORAL | 0 refills | Status: DC | PRN

## 2018-03-31 NOTE — Patient Instructions (Signed)
Avoid bending, stooping and avoid lifting weights greater than 10 lbs. Avoid prolong standing and walking. Order for a new walker with wheels. Surgery scheduling secretary Tivis Ringer, will call you in the next week to schedule for surgery.  Surgery recommended is a two level lumbar fusion L4-5 and L5-S1 this would be done with rods, screws and cages with local bone graft and allograft (donor bone graft). Take hydrocodone for for pain. Risk of surgery includes risk of infection 1 in 100 patients, bleeding 1/2% chance you would need a transfusion.   Risk to the nerves is one in 10,000. You will need to use a brace for 3 months and wean from the brace on the 4th month. Expect improved walking and standing tolerance. Expect relief of leg pain but numbness may persist depending on the length and degree of pressure that has been present.
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
## 2018-04-03 ENCOUNTER — Encounter (INDEPENDENT_AMBULATORY_CARE_PROVIDER_SITE_OTHER): Payer: Self-pay | Admitting: Specialist

## 2018-04-11 ENCOUNTER — Other Ambulatory Visit (INDEPENDENT_AMBULATORY_CARE_PROVIDER_SITE_OTHER): Payer: Self-pay | Admitting: Specialist

## 2018-04-11 NOTE — Telephone Encounter (Signed)
Sent request to Fayrene Fearing to advise since Dr. Otelia Sergeant is out of the office

## 2018-04-11 NOTE — Telephone Encounter (Signed)
Patient called needing Rx refilled (Oxycodone) The number to contact patient is [phone] °

## 2018-04-11 NOTE — Telephone Encounter (Signed)
Patient meant to request hydrocodone instead of oxycodone

## 2018-04-13 ENCOUNTER — Encounter (INDEPENDENT_AMBULATORY_CARE_PROVIDER_SITE_OTHER): Payer: Self-pay | Admitting: Specialist

## 2018-04-14 NOTE — Telephone Encounter (Signed)
Patient's mother called inquiring about his prescription.  She was wanting to pick it up this afternoon.

## 2018-04-14 NOTE — Telephone Encounter (Signed)
I called and advised Michael [name] that as soon as his rx is ready that we would give her a call.

## 2018-04-15 ENCOUNTER — Other Ambulatory Visit (INDEPENDENT_AMBULATORY_CARE_PROVIDER_SITE_OTHER): Payer: Self-pay | Admitting: Specialist

## 2018-04-15 MED ORDER — HYDROCODONE-ACETAMINOPHEN 10-325 MG PO TABS: 1.0000 | ORAL_TABLET | Freq: Four times a day (QID) | ORAL | 0 refills | Status: DC | PRN

## 2018-04-15 NOTE — Telephone Encounter (Signed)
Michael [name] is aware that his rx is ready for pick up

## 2018-04-15 NOTE — Telephone Encounter (Signed)
Patients mother called requesting a refill on her sons hydrocodone. CB # [phone]

## 2018-04-15 NOTE — Telephone Encounter (Signed)
Michael [name] called to check on rx, I did tell her that you would call her once ready for pick up but she is requesting a call back from you when possible. # [phone]

## 2018-04-16 NOTE — Telephone Encounter (Signed)
Michael [name] is aware rx is ready for pick up after 815 on 04/16/18

## 2018-04-24 ENCOUNTER — Other Ambulatory Visit (INDEPENDENT_AMBULATORY_CARE_PROVIDER_SITE_OTHER): Payer: Self-pay | Admitting: Radiology

## 2018-04-24 MED ORDER — HYDROCODONE-ACETAMINOPHEN 10-325 MG PO TABS: 1.0000 | ORAL_TABLET | Freq: Four times a day (QID) | ORAL | 0 refills | Status: DC | PRN

## 2018-05-01 ENCOUNTER — Telehealth (INDEPENDENT_AMBULATORY_CARE_PROVIDER_SITE_OTHER): Payer: Self-pay | Admitting: *Deleted

## 2018-05-01 ENCOUNTER — Ambulatory Visit (INDEPENDENT_AMBULATORY_CARE_PROVIDER_SITE_OTHER): Payer: BLUE CROSS/BLUE SHIELD | Admitting: Specialist

## 2018-05-01 ENCOUNTER — Encounter (INDEPENDENT_AMBULATORY_CARE_PROVIDER_SITE_OTHER): Payer: Self-pay | Admitting: Specialist

## 2018-05-01 VITALS — BP 122/64 | HR 79 | Ht 71.0 in | Wt 194.0 lb

## 2018-05-01 DIAGNOSIS — Z9889 Other specified postprocedural states: Secondary | ICD-10-CM

## 2018-05-01 DIAGNOSIS — M48062 Spinal stenosis, lumbar region with neurogenic claudication: Secondary | ICD-10-CM | POA: Diagnosis not present

## 2018-05-01 DIAGNOSIS — M4726 Other spondylosis with radiculopathy, lumbar region: Secondary | ICD-10-CM

## 2018-05-01 DIAGNOSIS — L732 Hidradenitis suppurativa: Secondary | ICD-10-CM

## 2018-05-01 MED ORDER — HYDROCODONE-ACETAMINOPHEN 10-325 MG PO TABS: 1.0000 | ORAL_TABLET | Freq: Four times a day (QID) | ORAL | 0 refills | Status: DC | PRN
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
## 2018-05-01 NOTE — Patient Instructions (Addendum)
Avoid frequent bending and stooping  No lifting greater than 10 lbs. May use ice or moist heat for pain. Weight loss is of benefit. Handicap license is approved. Avoid bending, stooping and avoid lifting weights greater than 10 lbs. Avoid prolong standing and walking. Avoid frequent bending and stooping  No lifting greater than 10 lbs. May use ice or moist heat for pain. Weight loss is of benefit. Handicap license is approved. Dr. Brandywine BlasNewton's secretary/Assistant will call to arrange for blocks of the lumbar facets to determine If RFA of facets is an option.

## 2018-05-06 NOTE — Telephone Encounter (Signed)
I called and moved appt to 06/20/18

## 2018-05-19 ENCOUNTER — Other Ambulatory Visit (INDEPENDENT_AMBULATORY_CARE_PROVIDER_SITE_OTHER): Payer: Self-pay | Admitting: Specialist

## 2018-05-19 MED ORDER — HYDROCODONE-ACETAMINOPHEN 10-325 MG PO TABS: 1.0000 | ORAL_TABLET | Freq: Four times a day (QID) | ORAL | 0 refills | Status: DC | PRN

## 2018-05-19 NOTE — Telephone Encounter (Signed)
I called and advised Darel HongJudy, pts mom, that his rx is ready , tried to call patient but was only ringing once and hanging up

## 2018-05-19 NOTE — Telephone Encounter (Signed)
Sent to Dr. Nitka ° ° °

## 2018-05-19 NOTE — Telephone Encounter (Signed)
Patient called requesting an RX refill on his Hydrocodone.  CB#[phone].  Thank you.

## 2018-06-02 ENCOUNTER — Ambulatory Visit (INDEPENDENT_AMBULATORY_CARE_PROVIDER_SITE_OTHER): Payer: BLUE CROSS/BLUE SHIELD | Admitting: Physical Medicine and Rehabilitation

## 2018-06-02 ENCOUNTER — Ambulatory Visit (INDEPENDENT_AMBULATORY_CARE_PROVIDER_SITE_OTHER): Payer: Self-pay

## 2018-06-02 ENCOUNTER — Ambulatory Visit (INDEPENDENT_AMBULATORY_CARE_PROVIDER_SITE_OTHER): Payer: BLUE CROSS/BLUE SHIELD | Admitting: Specialist

## 2018-06-02 ENCOUNTER — Encounter (INDEPENDENT_AMBULATORY_CARE_PROVIDER_SITE_OTHER): Payer: Self-pay | Admitting: Physical Medicine and Rehabilitation

## 2018-06-02 ENCOUNTER — Other Ambulatory Visit (INDEPENDENT_AMBULATORY_CARE_PROVIDER_SITE_OTHER): Payer: Self-pay | Admitting: Radiology

## 2018-06-02 VITALS — BP 119/75 | HR 89

## 2018-06-02 DIAGNOSIS — M47816 Spondylosis without myelopathy or radiculopathy, lumbar region: Secondary | ICD-10-CM

## 2018-06-02 MED ORDER — HYDROCODONE-ACETAMINOPHEN 10-325 MG PO TABS: 1.0000 | ORAL_TABLET | Freq: Four times a day (QID) | ORAL | 0 refills | Status: DC | PRN

## 2018-06-02 MED ORDER — BUPIVACAINE HCL 0.5 % IJ SOLN: 3.0000 mL | Freq: Once | INTRAMUSCULAR | Status: DC

## 2018-06-02 NOTE — Progress Notes (Signed)
[name] is a 52 year old gentleman that comes in today for what is going to be a plannedod medial branch block of the lower facet joints as requested by Dr. Vira BrownsJames Nitka his spine surgeon.  He does report axial back pain is been going on for many weeks.  Prior epidural injection for pain in the left thigh did seem to help but he was continued to have low back pain and it was felt like maybe this could be facet joint mediated pain.  He has had a history of chronic pain syndrome as well as chronic opioid use.  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  He has a pretty significant abscess on the right scapular region laterally.  He is unsure after we spoke that he would be able to tell much difference from an injection of his back because the pain of the ulcerations in the skin.  We decided to hold off at this point and will see him back if needed after he has had for treatment and resolution of the abscess.   .Numeric Pain Rating Scale and Functional Assessment Average Pain 5   In the last MONTH (on 0-10 scale) has pain interfered with the following?  1. General activity like being  able to carry out your everyday physical activities such as walking, climbing stairs, carrying groceries, or moving a chair?  Rating(2)   +Driver, -BT, +Dye Allergies (Contrast).

## 2018-06-02 NOTE — Telephone Encounter (Signed)
Pt picked up rx at appt.

## 2018-06-12 ENCOUNTER — Other Ambulatory Visit (INDEPENDENT_AMBULATORY_CARE_PROVIDER_SITE_OTHER): Payer: Self-pay | Admitting: Radiology

## 2018-06-13 ENCOUNTER — Telehealth (INDEPENDENT_AMBULATORY_CARE_PROVIDER_SITE_OTHER): Payer: Self-pay | Admitting: *Deleted

## 2018-06-13 MED ORDER — HYDROCODONE-ACETAMINOPHEN 10-325 MG PO TABS: 1.0000 | ORAL_TABLET | Freq: Four times a day (QID) | ORAL | 0 refills | Status: DC | PRN

## 2018-06-13 NOTE — Telephone Encounter (Signed)
IC pt and advised him RX ready for pickup

## 2018-06-20 ENCOUNTER — Encounter (INDEPENDENT_AMBULATORY_CARE_PROVIDER_SITE_OTHER): Payer: Self-pay | Admitting: Specialist

## 2018-06-20 ENCOUNTER — Ambulatory Visit (INDEPENDENT_AMBULATORY_CARE_PROVIDER_SITE_OTHER): Payer: BLUE CROSS/BLUE SHIELD | Admitting: Specialist

## 2018-06-20 ENCOUNTER — Ambulatory Visit (INDEPENDENT_AMBULATORY_CARE_PROVIDER_SITE_OTHER): Payer: Self-pay

## 2018-06-20 ENCOUNTER — Telehealth (INDEPENDENT_AMBULATORY_CARE_PROVIDER_SITE_OTHER): Payer: Self-pay | Admitting: Specialist

## 2018-06-20 VITALS — BP 121/63 | HR 81 | Ht 71.0 in | Wt 197.5 lb

## 2018-06-20 DIAGNOSIS — M21372 Foot drop, left foot: Secondary | ICD-10-CM | POA: Diagnosis not present

## 2018-06-20 DIAGNOSIS — M5137 Other intervertebral disc degeneration, lumbosacral region: Secondary | ICD-10-CM | POA: Diagnosis not present

## 2018-06-20 DIAGNOSIS — M5416 Radiculopathy, lumbar region: Secondary | ICD-10-CM

## 2018-06-20 DIAGNOSIS — M48062 Spinal stenosis, lumbar region with neurogenic claudication: Secondary | ICD-10-CM

## 2018-06-20 DIAGNOSIS — M51379 Other intervertebral disc degeneration, lumbosacral region without mention of lumbar back pain or lower extremity pain: Secondary | ICD-10-CM

## 2018-06-20 MED ORDER — HYDROCODONE-ACETAMINOPHEN 10-325 MG PO TABS: 1.0000 | ORAL_TABLET | ORAL | 0 refills | Status: DC | PRN

## 2018-06-20 NOTE — Patient Instructions (Signed)
Avoid bending, stooping and avoid lifting weights greater than 10 lbs. Avoid prolong standing and walking. Order for a new walker with wheels. Surgery scheduling secretary Tivis RingerSherri Billings, will call you in the next week to schedule for surgery.  Surgery recommended is a two level lumbar fusion Right L5-S1 and L4-5 this would be done with rods, screws and cages with local bone graft and allograft (donor bone graft). Take hydrocodone for for pain. Risk of surgery includes risk of infection 1 in 200 patients, bleeding 1/2% chance you would need a transfusion.   Risk to the nerves is one in 10,000. You will need to use a brace for 3 months and wean from the brace on the 4th month. Expect improved walking and standing tolerance. Expect relief of leg pain but numbness may persist depending on the length and degree of pressure that has been present.
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
## 2018-06-20 NOTE — Telephone Encounter (Signed)
Sched pt appt 08/07/18 2:15pm  1mnth f/u needed

## 2018-06-20 NOTE — Telephone Encounter (Signed)
I put him on the cancellation list 

## 2018-06-30 ENCOUNTER — Encounter (INDEPENDENT_AMBULATORY_CARE_PROVIDER_SITE_OTHER): Payer: BLUE CROSS/BLUE SHIELD | Admitting: Physical Medicine and Rehabilitation

## 2018-07-02 ENCOUNTER — Other Ambulatory Visit (INDEPENDENT_AMBULATORY_CARE_PROVIDER_SITE_OTHER): Payer: Self-pay | Admitting: Specialist

## 2018-07-02 MED ORDER — HYDROCODONE-ACETAMINOPHEN 10-325 MG PO TABS: 1.0000 | ORAL_TABLET | Freq: Four times a day (QID) | ORAL | 0 refills | Status: DC | PRN

## 2018-07-02 NOTE — Telephone Encounter (Signed)
I called and lmom advising his rx was ready for pick up at the front desk

## 2018-07-02 NOTE — Telephone Encounter (Signed)
Patient requesting rx refill on hydrocodone. Patients # [phone]

## 2018-07-10 ENCOUNTER — Other Ambulatory Visit (INDEPENDENT_AMBULATORY_CARE_PROVIDER_SITE_OTHER): Payer: Self-pay | Admitting: Radiology

## 2018-07-10 MED ORDER — HYDROCODONE-ACETAMINOPHEN 10-325 MG PO TABS: 1.0000 | ORAL_TABLET | Freq: Four times a day (QID) | ORAL | 0 refills | Status: DC | PRN

## 2018-07-10 NOTE — Telephone Encounter (Signed)
Pt is aware his rx is ready for pick up 

## 2018-07-17 ENCOUNTER — Encounter (INDEPENDENT_AMBULATORY_CARE_PROVIDER_SITE_OTHER): Payer: Self-pay | Admitting: Surgery

## 2018-07-17 ENCOUNTER — Ambulatory Visit (INDEPENDENT_AMBULATORY_CARE_PROVIDER_SITE_OTHER): Payer: BLUE CROSS/BLUE SHIELD | Admitting: Surgery

## 2018-07-17 VITALS — BP 114/70 | HR 81 | Ht 70.5 in | Wt 197.0 lb

## 2018-07-17 DIAGNOSIS — M5416 Radiculopathy, lumbar region: Secondary | ICD-10-CM | POA: Diagnosis not present

## 2018-07-17 DIAGNOSIS — M5136 Other intervertebral disc degeneration, lumbar region: Secondary | ICD-10-CM | POA: Diagnosis not present

## 2018-07-17 MED ORDER — HYDROCODONE-ACETAMINOPHEN 10-325 MG PO TABS: 1.0000 | ORAL_TABLET | Freq: Two times a day (BID) | ORAL | 0 refills | Status: DC | PRN
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
## 2018-07-17 NOTE — Progress Notes (Signed)
52 year old white male with a history of L4-5 and L5-S1 stenosis, back pain and lower extremity radiculopathy comes in for preop evaluation.  Today full history and physical performed.

## 2018-07-18 ENCOUNTER — Encounter (HOSPITAL_COMMUNITY)
Admission: RE | Admit: 2018-07-18 | Discharge: 2018-07-18 | Disposition: A | Discharge status: Home / Self Care | Payer: BLUE CROSS/BLUE SHIELD | Source: Ambulatory Visit | Attending: Specialist | Admitting: Specialist

## 2018-07-18 ENCOUNTER — Other Ambulatory Visit: Payer: Self-pay

## 2018-07-18 ENCOUNTER — Encounter (HOSPITAL_COMMUNITY): Payer: Self-pay

## 2018-07-18 DIAGNOSIS — Z01812 Encounter for preprocedural laboratory examination: Secondary | ICD-10-CM | POA: Insufficient documentation

## 2018-07-18 HISTORY — DX: Personal history of other medical treatment: Z92.89

## 2018-07-18 HISTORY — DX: Disorder of the skin and subcutaneous tissue, unspecified: L98.9

## 2018-07-18 HISTORY — DX: Acne conglobata: L70.1

## 2018-07-18 LAB — COMPREHENSIVE METABOLIC PANEL
ALK PHOS: 107 U/L (ref 38–126)
ALT: 22 U/L (ref 0–44)
ANION GAP: 9 (ref 5–15)
AST: 31 U/L (ref 15–41)
Albumin: 4 g/dL (ref 3.5–5.0)
BILIRUBIN TOTAL: 1.7 mg/dL — AB (ref 0.3–1.2)
BUN: 12 mg/dL (ref 6–20)
CALCIUM: 10 mg/dL (ref 8.9–10.3)
CO2: 23 mmol/L (ref 22–32)
Chloride: 104 mmol/L (ref 98–111)
Creatinine, Ser: 0.87 mg/dL (ref 0.61–1.24)
Glucose, Bld: 111 mg/dL — ABNORMAL HIGH (ref 70–99)
POTASSIUM: 4.1 mmol/L (ref 3.5–5.1)
Sodium: 136 mmol/L (ref 135–145)
TOTAL PROTEIN: 8.1 g/dL (ref 6.5–8.1)

## 2018-07-18 LAB — URINALYSIS, ROUTINE W REFLEX MICROSCOPIC
Bilirubin Urine: NEGATIVE
GLUCOSE, UA: NEGATIVE mg/dL
HGB URINE DIPSTICK: NEGATIVE
Ketones, ur: NEGATIVE mg/dL
Leukocytes, UA: NEGATIVE
Nitrite: NEGATIVE
PH: 7 (ref 5.0–8.0)
Protein, ur: NEGATIVE mg/dL
SPECIFIC GRAVITY, URINE: 1.013 (ref 1.005–1.030)

## 2018-07-18 LAB — CBC
HEMATOCRIT: 36 % — AB (ref 39.0–52.0)
HEMOGLOBIN: 11.9 g/dL — AB (ref 13.0–17.0)
MCH: 32.8 pg (ref 26.0–34.0)
MCHC: 33.1 g/dL (ref 30.0–36.0)
MCV: 99.2 fL (ref 78.0–100.0)
Platelets: 146 10*3/uL — ABNORMAL LOW (ref 150–400)
RBC: 3.63 MIL/uL — ABNORMAL LOW (ref 4.22–5.81)
RDW: 14.3 % (ref 11.5–15.5)
WBC: 8.1 10*3/uL (ref 4.0–10.5)

## 2018-07-18 LAB — SURGICAL PCR SCREEN
MRSA, PCR: NEGATIVE
Staphylococcus aureus: POSITIVE — AB

## 2018-07-18 LAB — APTT: aPTT: 36 seconds (ref 24–36)

## 2018-07-18 LAB — PROTIME-INR
INR: 1.25
PROTHROMBIN TIME: 15.6 s — AB (ref 11.4–15.2)

## 2018-07-18 NOTE — Pre-Procedure Instructions (Signed)
Michael RhymesDonald [name]  07/18/2018     Your procedure is scheduled on Tuesday, August 27..  [facility] Admitting at 5:30 A.M.                Your surgery or procedure is scheduled for 7:30 AM   Call this number if you have problems the morning of surgery: [phone]  This is the number for the Pre- Surgical Desk.     Remember:  Do not eat or drink after midnight Monday, August 26.   Take these medicines the morning of surgery with A SIP OF WATER : omeprazole (PRILOSEC) propranolol (INDERAL)  Use Eye Drops  May take if needed: PROAIR HFA inhaler- please bring it with you methocarbamol (ROBAXIN)  ondansetron (ZOFRAN) traMADol (ULTRAM) HYDROcodone-acetaminophen (NORCO) or or oxycodone (OXY-IR)  STOP if you have not already taking Aspirin, Aspirin Products (Goody Powder, Excedrin Migraine), Ibuprofen (Advil), Naproxen (Aleve), Vitamins and Herbal Products (ie Fish Oil)  Special instructions:   [place]- Preparing For Surgery  Before surgery, you can play an important role. Because skin is not sterile, your skin needs to be as free of germs as possible. You can reduce the number of germs on your skin by washing with CHG (chlorahexidine gluconate) Soap before surgery.  CHG is an antiseptic cleaner which kills germs and bonds with the skin to continue killing germs even after washing.    Oral Hygiene is also important to reduce your risk of infection.  Remember - BRUSH YOUR TEETH THE MORNING OF SURGERY WITH YOUR REGULAR TOOTHPASTE  Please do not use if you have an allergy to CHG or antibacterial soaps. If your skin becomes reddened/irritated stop using the CHG.  Do not shave (including legs and underarms) for at least 48 hours prior to first CHG shower. It is OK to shave your face.  Please follow these instructions carefully.   1. Shower the NIGHT BEFORE SURGERY and the MORNING OF SURGERY with CHG.   2. If you chose to wash your hair, wash your hair first as  usual with your normal shampoo.  3. After you shampoo, rinse your hair and body thoroughly to remove the shampoo.        Wash your face and private area with the soap you use at home, then rinse.  4. Use CHG as you would any other liquid soap. You can apply CHG directly to the skin and wash gently with a scrungie or a clean washcloth.   5. Apply the CHG Soap to your body ONLY FROM THE NECK DOWN.  Do not use on open wounds or open sores. Avoid contact with your eyes, ears, mouth and genitals (private parts). Wash thoroughly, paying special attention to the area where your surgery will be performed.  6. Thoroughly rinse your body with warm water from the neck down.  7. DO NOT shower/wash with your normal soap after using and rinsing off the CHG Soap.  8. Pat yourself dry with a CLEAN TOWEL.  9. Wear CLEAN PAJAMAS to bed the night before surgery, wear comfortable clothes the morning of surgery  10. Place CLEAN SHEETS on your bed the night of your first shower and DO NOT SLEEP WITH PETS.   Day of Surgery: Shower as written above  Do not apply any deodorants/lotions, powders or colognes.  Please wear clean clothes to the hospital/surgery center.   Remember to brush your teeth WITH YOUR REGULAR TOOTHPASTE.  Do not wear jewelry, make-up or  nail polish.  Do not shave 48 hours prior to surgery.  Men may shave face and neck.  Do not bring valuables to the hospital.  [facility] is not responsible for any belongings or valuables.  Contacts, dentures or bridgework may not be worn into surgery.  Leave your suitcase in the car.  After surgery it may be brought to your room.  For patients admitted to the hospital, discharge time will be determined by your treatment team.  Patients discharged the day of surgery will not be allowed to drive home.   Please read over the following fact sheets that you were given: Pain Booklet,  Incentive Spirometry, Surgical Site Infections.

## 2018-07-21 NOTE — Anesthesia Preprocedure Evaluation (Addendum)
Anesthesia Evaluation  Patient identified by MRN, date of birth, ID band Patient awake    Reviewed: Allergy & Precautions, NPO status , Patient's Chart, lab work & pertinent test results  History of Anesthesia Complications (+) PONVNegative for: history of anesthetic complications  Airway Mallampati: III  TM Distance: >3 FB Neck ROM: Full    Dental no notable dental hx. (+) Dental Advisory Given, Teeth Intact   Pulmonary sleep apnea and Continuous Positive Airway Pressure Ventilation , Current Smoker,    Pulmonary exam normal breath sounds clear to auscultation       Cardiovascular hypertension, Pt. on medications Normal cardiovascular exam Rhythm:Regular Rate:Normal     Neuro/Psych PSYCHIATRIC DISORDERS Anxiety Depression    GI/Hepatic Neg liver ROS, GERD  Medicated and Controlled,"liver doesn't work as well as it should" per patient due to heavy alcohol use. No alcohol since 2018   Endo/Other  negative endocrine ROS  Renal/GU negative Renal ROS     Musculoskeletal   Abdominal   Peds  Hematology   Anesthesia Other Findings   Reproductive/Obstetrics                          Anesthesia Physical Anesthesia Plan  ASA: II  Anesthesia Plan: General   Post-op Pain Management:    Induction: Intravenous  PONV Risk Score and Plan: 4 or greater and Ondansetron, Dexamethasone, Scopolamine patch - Pre-op and Diphenhydramine  Airway Management Planned: Oral ETT  Additional Equipment:   Intra-op Plan:   Post-operative Plan: Extubation in OR  Informed Consent: I have reviewed the patients History and Physical, chart, labs and discussed the procedure including the risks, benefits and alternatives for the proposed anesthesia with the patient or authorized representative who has indicated his/her understanding and acceptance.   Dental advisory given  Plan Discussed with: CRNA, Anesthesiologist  and Surgeon  Anesthesia Plan Comments:         Anesthesia Quick Evaluation

## 2018-07-22 ENCOUNTER — Inpatient Hospital Stay (HOSPITAL_COMMUNITY): Payer: BLUE CROSS/BLUE SHIELD | Admitting: Anesthesiology

## 2018-07-22 ENCOUNTER — Other Ambulatory Visit: Payer: Self-pay

## 2018-07-22 ENCOUNTER — Inpatient Hospital Stay (HOSPITAL_COMMUNITY)
Admission: RE | Disposition: A | Discharge status: Home Health | Payer: Self-pay | Source: Home / Self Care | Attending: Specialist

## 2018-07-22 ENCOUNTER — Encounter (HOSPITAL_COMMUNITY): Payer: Self-pay | Admitting: General Practice

## 2018-07-22 ENCOUNTER — Inpatient Hospital Stay (HOSPITAL_COMMUNITY)
Admission: RE | Admit: 2018-07-22 | Discharge: 2018-07-26 | DRG: 454 | Disposition: A | Discharge status: Home Health | Payer: BLUE CROSS/BLUE SHIELD | Attending: Specialist | Admitting: Specialist

## 2018-07-22 ENCOUNTER — Inpatient Hospital Stay (HOSPITAL_COMMUNITY): Payer: BLUE CROSS/BLUE SHIELD

## 2018-07-22 DIAGNOSIS — Z87892 Personal history of anaphylaxis: Secondary | ICD-10-CM

## 2018-07-22 DIAGNOSIS — K219 Gastro-esophageal reflux disease without esophagitis: Secondary | ICD-10-CM | POA: Diagnosis present

## 2018-07-22 DIAGNOSIS — G4733 Obstructive sleep apnea (adult) (pediatric): Secondary | ICD-10-CM | POA: Diagnosis present

## 2018-07-22 DIAGNOSIS — M51369 Other intervertebral disc degeneration, lumbar region without mention of lumbar back pain or lower extremity pain: Secondary | ICD-10-CM | POA: Diagnosis present

## 2018-07-22 DIAGNOSIS — Z419 Encounter for procedure for purposes other than remedying health state, unspecified: Secondary | ICD-10-CM | POA: Diagnosis not present

## 2018-07-22 DIAGNOSIS — Z8349 Family history of other endocrine, nutritional and metabolic diseases: Secondary | ICD-10-CM

## 2018-07-22 DIAGNOSIS — M48062 Spinal stenosis, lumbar region with neurogenic claudication: Secondary | ICD-10-CM

## 2018-07-22 DIAGNOSIS — E871 Hypo-osmolality and hyponatremia: Secondary | ICD-10-CM | POA: Diagnosis not present

## 2018-07-22 DIAGNOSIS — T380X5A Adverse effect of glucocorticoids and synthetic analogues, initial encounter: Secondary | ICD-10-CM | POA: Diagnosis not present

## 2018-07-22 DIAGNOSIS — Z79899 Other long term (current) drug therapy: Secondary | ICD-10-CM | POA: Diagnosis not present

## 2018-07-22 DIAGNOSIS — K5903 Drug induced constipation: Secondary | ICD-10-CM | POA: Diagnosis not present

## 2018-07-22 DIAGNOSIS — F17210 Nicotine dependence, cigarettes, uncomplicated: Secondary | ICD-10-CM | POA: Diagnosis present

## 2018-07-22 DIAGNOSIS — M5117 Intervertebral disc disorders with radiculopathy, lumbosacral region: Secondary | ICD-10-CM | POA: Diagnosis present

## 2018-07-22 DIAGNOSIS — D6959 Other secondary thrombocytopenia: Secondary | ICD-10-CM | POA: Diagnosis present

## 2018-07-22 DIAGNOSIS — F1120 Opioid dependence, uncomplicated: Secondary | ICD-10-CM | POA: Diagnosis present

## 2018-07-22 DIAGNOSIS — M4306 Spondylolysis, lumbar region: Secondary | ICD-10-CM | POA: Diagnosis present

## 2018-07-22 DIAGNOSIS — M5136 Other intervertebral disc degeneration, lumbar region: Secondary | ICD-10-CM | POA: Diagnosis present

## 2018-07-22 DIAGNOSIS — D696 Thrombocytopenia, unspecified: Secondary | ICD-10-CM | POA: Diagnosis present

## 2018-07-22 DIAGNOSIS — M48061 Spinal stenosis, lumbar region without neurogenic claudication: Secondary | ICD-10-CM

## 2018-07-22 DIAGNOSIS — Z8261 Family history of arthritis: Secondary | ICD-10-CM

## 2018-07-22 DIAGNOSIS — Z91040 Latex allergy status: Secondary | ICD-10-CM

## 2018-07-22 DIAGNOSIS — D638 Anemia in other chronic diseases classified elsewhere: Secondary | ICD-10-CM | POA: Diagnosis present

## 2018-07-22 DIAGNOSIS — Z981 Arthrodesis status: Secondary | ICD-10-CM | POA: Diagnosis not present

## 2018-07-22 DIAGNOSIS — D892 Hypergammaglobulinemia, unspecified: Secondary | ICD-10-CM | POA: Diagnosis present

## 2018-07-22 DIAGNOSIS — Z91048 Other nonmedicinal substance allergy status: Secondary | ICD-10-CM

## 2018-07-22 DIAGNOSIS — F1011 Alcohol abuse, in remission: Secondary | ICD-10-CM | POA: Diagnosis present

## 2018-07-22 DIAGNOSIS — T40605A Adverse effect of unspecified narcotics, initial encounter: Secondary | ICD-10-CM | POA: Diagnosis not present

## 2018-07-22 DIAGNOSIS — I10 Essential (primary) hypertension: Secondary | ICD-10-CM | POA: Diagnosis present

## 2018-07-22 DIAGNOSIS — K7030 Alcoholic cirrhosis of liver without ascites: Secondary | ICD-10-CM | POA: Diagnosis present

## 2018-07-22 DIAGNOSIS — L732 Hidradenitis suppurativa: Secondary | ICD-10-CM | POA: Diagnosis present

## 2018-07-22 DIAGNOSIS — D62 Acute posthemorrhagic anemia: Secondary | ICD-10-CM | POA: Diagnosis present

## 2018-07-22 DIAGNOSIS — Z880 Allergy status to penicillin: Secondary | ICD-10-CM

## 2018-07-22 DIAGNOSIS — Z91041 Radiographic dye allergy status: Secondary | ICD-10-CM

## 2018-07-22 DIAGNOSIS — Z8249 Family history of ischemic heart disease and other diseases of the circulatory system: Secondary | ICD-10-CM

## 2018-07-22 DIAGNOSIS — M4807 Spinal stenosis, lumbosacral region: Secondary | ICD-10-CM | POA: Diagnosis present

## 2018-07-22 DIAGNOSIS — M5116 Intervertebral disc disorders with radiculopathy, lumbar region: Secondary | ICD-10-CM | POA: Diagnosis present

## 2018-07-22 DIAGNOSIS — Z8614 Personal history of Methicillin resistant Staphylococcus aureus infection: Secondary | ICD-10-CM

## 2018-07-22 DIAGNOSIS — E785 Hyperlipidemia, unspecified: Secondary | ICD-10-CM | POA: Diagnosis present

## 2018-07-22 DIAGNOSIS — Z811 Family history of alcohol abuse and dependence: Secondary | ICD-10-CM

## 2018-07-22 DIAGNOSIS — R739 Hyperglycemia, unspecified: Secondary | ICD-10-CM | POA: Diagnosis not present

## 2018-07-22 SURGERY — POSTERIOR LUMBAR FUSION 2 LEVEL
Anesthesia: General | Site: Spine Lumbar

## 2018-07-22 MED ORDER — DEXAMETHASONE SODIUM PHOSPHATE 10 MG/ML IJ SOLN
INTRAMUSCULAR | Status: AC
  Filled 2018-07-22: qty 1

## 2018-07-22 MED ORDER — LACTATED RINGERS IV SOLN
INTRAVENOUS | Status: DC | PRN
  Administered 2018-07-22 (×3): via INTRAVENOUS

## 2018-07-22 MED ORDER — FENTANYL CITRATE (PF) 250 MCG/5ML IJ SOLN
INTRAMUSCULAR | Status: AC
  Filled 2018-07-22: qty 5

## 2018-07-22 MED ORDER — ACETAMINOPHEN 325 MG PO TABS
650.0000 mg | ORAL_TABLET | ORAL | Status: DC | PRN
  Administered 2018-07-23: 650 mg via ORAL
  Filled 2018-07-22: qty 2

## 2018-07-22 MED ORDER — THROMBIN (RECOMBINANT) 20000 UNITS EX SOLR
CUTANEOUS | Status: AC
  Filled 2018-07-22: qty 20000

## 2018-07-22 MED ORDER — ZOLPIDEM TARTRATE 5 MG PO TABS
10.0000 mg | ORAL_TABLET | Freq: Every day | ORAL | Status: DC
  Administered 2018-07-22 – 2018-07-25 (×4): 10 mg via ORAL
  Filled 2018-07-22 (×4): qty 2

## 2018-07-22 MED ORDER — CEFAZOLIN SODIUM-DEXTROSE 2-4 GM/100ML-% IV SOLN: 2.0000 g | Freq: Three times a day (TID) | INTRAVENOUS | Status: DC

## 2018-07-22 MED ORDER — FENTANYL CITRATE (PF) 100 MCG/2ML IJ SOLN
INTRAMUSCULAR | Status: DC | PRN
  Administered 2018-07-22 (×2): 50 ug via INTRAVENOUS
  Administered 2018-07-22: 150 ug via INTRAVENOUS

## 2018-07-22 MED ORDER — ALBUMIN HUMAN 5 % IV SOLN
INTRAVENOUS | Status: DC | PRN
  Administered 2018-07-22 (×2): via INTRAVENOUS

## 2018-07-22 MED ORDER — PANTOPRAZOLE SODIUM 40 MG PO TBEC: 40.0000 mg | DELAYED_RELEASE_TABLET | Freq: Every day | ORAL | Status: DC

## 2018-07-22 MED ORDER — DEXMEDETOMIDINE HCL 200 MCG/2ML IV SOLN
INTRAVENOUS | Status: DC | PRN
  Administered 2018-07-22 (×2): 20 ug via INTRAVENOUS

## 2018-07-22 MED ORDER — ROCURONIUM BROMIDE 10 MG/ML (PF) SYRINGE
PREFILLED_SYRINGE | INTRAVENOUS | Status: DC | PRN
  Administered 2018-07-22: 50 mg via INTRAVENOUS
  Administered 2018-07-22: 10 mg via INTRAVENOUS
  Administered 2018-07-22 (×3): 20 mg via INTRAVENOUS

## 2018-07-22 MED ORDER — KETAMINE HCL 100 MG/ML IJ SOLN
INTRAMUSCULAR | Status: AC
  Filled 2018-07-22: qty 1

## 2018-07-22 MED ORDER — GABAPENTIN 300 MG PO CAPS
300.0000 mg | ORAL_CAPSULE | Freq: Three times a day (TID) | ORAL | Status: DC
  Administered 2018-07-22 – 2018-07-26 (×11): 300 mg via ORAL
  Filled 2018-07-22 (×11): qty 1

## 2018-07-22 MED ORDER — LISINOPRIL 40 MG PO TABS
40.0000 mg | ORAL_TABLET | Freq: Every day | ORAL | Status: DC
  Administered 2018-07-23 – 2018-07-25 (×3): 40 mg via ORAL
  Filled 2018-07-22 (×2): qty 2
  Filled 2018-07-22: qty 1
  Filled 2018-07-22: qty 2
  Filled 2018-07-22 (×3): qty 1

## 2018-07-22 MED ORDER — KETAMINE HCL 10 MG/ML IJ SOLN
INTRAMUSCULAR | Status: DC | PRN
  Administered 2018-07-22: 20 mg via INTRAVENOUS

## 2018-07-22 MED ORDER — BISACODYL 5 MG PO TBEC: 5.0000 mg | DELAYED_RELEASE_TABLET | Freq: Every day | ORAL | Status: DC | PRN

## 2018-07-22 MED ORDER — BUPIVACAINE LIPOSOME 1.3 % IJ SUSP
20.0000 mL | INTRAMUSCULAR | Status: AC
  Administered 2018-07-22: 10 mL
  Filled 2018-07-22: qty 20

## 2018-07-22 MED ORDER — SUGAMMADEX SODIUM 200 MG/2ML IV SOLN
INTRAVENOUS | Status: DC | PRN
  Administered 2018-07-22: 180 mg via INTRAVENOUS

## 2018-07-22 MED ORDER — BUPIVACAINE LIPOSOME 1.3 % IJ SUSP
INTRAMUSCULAR | Status: DC | PRN
  Administered 2018-07-22: 7 mL

## 2018-07-22 MED ORDER — LIDOCAINE 2% (20 MG/ML) 5 ML SYRINGE
INTRAMUSCULAR | Status: DC | PRN
  Administered 2018-07-22: 40 mg via INTRAVENOUS

## 2018-07-22 MED ORDER — KETOROLAC TROMETHAMINE 15 MG/ML IJ SOLN
7.5000 mg | Freq: Four times a day (QID) | INTRAMUSCULAR | Status: DC
  Administered 2018-07-22 – 2018-07-23 (×3): 7.5 mg via INTRAVENOUS
  Filled 2018-07-22 (×3): qty 1

## 2018-07-22 MED ORDER — SODIUM CHLORIDE 0.9 % IV SOLN
INTRAVENOUS | Status: DC
  Administered 2018-07-22: 100 mL via INTRAVENOUS

## 2018-07-22 MED ORDER — PROPRANOLOL HCL 10 MG PO TABS
10.0000 mg | ORAL_TABLET | Freq: Two times a day (BID) | ORAL | Status: DC
  Administered 2018-07-23 – 2018-07-26 (×7): 10 mg via ORAL
  Filled 2018-07-22 (×8): qty 1

## 2018-07-22 MED ORDER — CLINDAMYCIN PHOSPHATE 600 MG/50ML IV SOLN
600.0000 mg | INTRAVENOUS | Status: DC
  Filled 2018-07-22: qty 50

## 2018-07-22 MED ORDER — MIDAZOLAM HCL 5 MG/5ML IJ SOLN
INTRAMUSCULAR | Status: DC | PRN
  Administered 2018-07-22: 2 mg via INTRAVENOUS

## 2018-07-22 MED ORDER — SODIUM CHLORIDE 0.9% FLUSH: 3.0000 mL | INTRAVENOUS | Status: DC | PRN

## 2018-07-22 MED ORDER — 0.9 % SODIUM CHLORIDE (POUR BTL) OPTIME
TOPICAL | Status: DC | PRN
  Administered 2018-07-22 (×3): 1000 mL

## 2018-07-22 MED ORDER — VANCOMYCIN HCL IN DEXTROSE 1-5 GM/200ML-% IV SOLN
1000.0000 mg | Freq: Two times a day (BID) | INTRAVENOUS | Status: AC
  Administered 2018-07-22 – 2018-07-23 (×2): 1000 mg via INTRAVENOUS
  Filled 2018-07-22 (×2): qty 200

## 2018-07-22 MED ORDER — DEXAMETHASONE SODIUM PHOSPHATE 10 MG/ML IJ SOLN
INTRAMUSCULAR | Status: DC | PRN
  Administered 2018-07-22: 10 mg via INTRAVENOUS

## 2018-07-22 MED ORDER — EPHEDRINE SULFATE-NACL 50-0.9 MG/10ML-% IV SOSY
PREFILLED_SYRINGE | INTRAVENOUS | Status: DC | PRN
  Administered 2018-07-22: 10 mg via INTRAVENOUS

## 2018-07-22 MED ORDER — ALBUMIN HUMAN 5 % IV SOLN
INTRAVENOUS | Status: AC
  Filled 2018-07-22: qty 250

## 2018-07-22 MED ORDER — TRAZODONE HCL 100 MG PO TABS
200.0000 mg | ORAL_TABLET | Freq: Every day | ORAL | Status: DC
  Administered 2018-07-23 – 2018-07-25 (×3): 200 mg via ORAL
  Filled 2018-07-22 (×5): qty 2

## 2018-07-22 MED ORDER — PROPOFOL 10 MG/ML IV BOLUS
INTRAVENOUS | Status: DC | PRN
  Administered 2018-07-22: 30 mg via INTRAVENOUS
  Administered 2018-07-22: 170 mg via INTRAVENOUS

## 2018-07-22 MED ORDER — HYDROMORPHONE HCL 1 MG/ML IJ SOLN
0.2500 mg | INTRAMUSCULAR | Status: DC | PRN
  Administered 2018-07-22 (×4): 0.5 mg via INTRAVENOUS

## 2018-07-22 MED ORDER — METHOCARBAMOL 500 MG PO TABS
500.0000 mg | ORAL_TABLET | Freq: Four times a day (QID) | ORAL | Status: DC | PRN
  Administered 2018-07-23 – 2018-07-25 (×7): 500 mg via ORAL
  Filled 2018-07-22 (×7): qty 1

## 2018-07-22 MED ORDER — PHENOL 1.4 % MT LIQD: 1.0000 | OROMUCOSAL | Status: DC | PRN

## 2018-07-22 MED ORDER — TRANEXAMIC ACID 1000 MG/10ML IV SOLN
1000.0000 mg | INTRAVENOUS | Status: AC
  Administered 2018-07-22: 1000 mg via INTRAVENOUS
  Filled 2018-07-22: qty 1000

## 2018-07-22 MED ORDER — DAPSONE 100 MG PO TABS
100.0000 mg | ORAL_TABLET | Freq: Every day | ORAL | Status: DC
  Administered 2018-07-23 – 2018-07-26 (×4): 100 mg via ORAL
  Filled 2018-07-22 (×5): qty 1

## 2018-07-22 MED ORDER — FENTANYL CITRATE (PF) 100 MCG/2ML IJ SOLN
25.0000 ug | INTRAMUSCULAR | Status: DC | PRN
  Administered 2018-07-22 (×3): 50 ug via INTRAVENOUS

## 2018-07-22 MED ORDER — METHOCARBAMOL 1000 MG/10ML IJ SOLN
500.0000 mg | Freq: Four times a day (QID) | INTRAVENOUS | Status: DC | PRN
  Filled 2018-07-22: qty 5

## 2018-07-22 MED ORDER — LACTATED RINGERS IV BOLUS
1000.0000 mL | Freq: Once | INTRAVENOUS | Status: AC
  Administered 2018-07-22: 1000 mL via INTRAVENOUS

## 2018-07-22 MED ORDER — HYDROMORPHONE HCL 1 MG/ML IJ SOLN
INTRAMUSCULAR | Status: AC
  Filled 2018-07-22: qty 1

## 2018-07-22 MED ORDER — ROCURONIUM BROMIDE 50 MG/5ML IV SOSY
PREFILLED_SYRINGE | INTRAVENOUS | Status: AC
  Filled 2018-07-22: qty 5

## 2018-07-22 MED ORDER — ALBUMIN HUMAN 5 % IV SOLN
INTRAVENOUS | Status: AC
  Administered 2018-07-22: 12.5 g
  Filled 2018-07-22: qty 250

## 2018-07-22 MED ORDER — SODIUM CHLORIDE 0.9 % IV SOLN
INTRAVENOUS | Status: DC | PRN
  Administered 2018-07-22: 10 ug/kg/min via INTRAVENOUS

## 2018-07-22 MED ORDER — ALBUMIN HUMAN 5 % IV SOLN
12.5000 g | Freq: Once | INTRAVENOUS | Status: AC
  Administered 2018-07-22: 12.5 g via INTRAVENOUS

## 2018-07-22 MED ORDER — MIDAZOLAM HCL 2 MG/2ML IJ SOLN
INTRAMUSCULAR | Status: AC
  Filled 2018-07-22: qty 2

## 2018-07-22 MED ORDER — THROMBIN 20000 UNITS EX SOLR
CUTANEOUS | Status: DC | PRN
  Administered 2018-07-22: 10:00:00 via TOPICAL

## 2018-07-22 MED ORDER — FENTANYL CITRATE (PF) 100 MCG/2ML IJ SOLN
INTRAMUSCULAR | Status: AC
  Filled 2018-07-22: qty 2

## 2018-07-22 MED ORDER — DOCUSATE SODIUM 100 MG PO CAPS
100.0000 mg | ORAL_CAPSULE | Freq: Two times a day (BID) | ORAL | Status: DC
  Administered 2018-07-22 – 2018-07-24 (×5): 100 mg via ORAL
  Filled 2018-07-22 (×5): qty 1

## 2018-07-22 MED ORDER — ACETAMINOPHEN 10 MG/ML IV SOLN
INTRAVENOUS | Status: AC
  Filled 2018-07-22: qty 100

## 2018-07-22 MED ORDER — CLINDAMYCIN PHOSPHATE 600 MG/50ML IV SOLN
600.0000 mg | INTRAVENOUS | Status: AC
  Administered 2018-07-22 (×2): 600 mg via INTRAVENOUS
  Filled 2018-07-22: qty 50

## 2018-07-22 MED ORDER — PROMETHAZINE HCL 25 MG/ML IJ SOLN: 6.2500 mg | INTRAMUSCULAR | Status: DC | PRN

## 2018-07-22 MED ORDER — PHENYLEPHRINE 40 MCG/ML (10ML) SYRINGE FOR IV PUSH (FOR BLOOD PRESSURE SUPPORT)
PREFILLED_SYRINGE | INTRAVENOUS | Status: DC | PRN
  Administered 2018-07-22 (×2): 80 ug via INTRAVENOUS
  Administered 2018-07-22 (×2): 120 ug via INTRAVENOUS

## 2018-07-22 MED ORDER — HYDROCHLOROTHIAZIDE 25 MG PO TABS
25.0000 mg | ORAL_TABLET | Freq: Every day | ORAL | Status: DC
  Administered 2018-07-23 – 2018-07-25 (×3): 25 mg via ORAL
  Filled 2018-07-22 (×3): qty 1

## 2018-07-22 MED ORDER — EPHEDRINE 5 MG/ML INJ
INTRAVENOUS | Status: AC
  Filled 2018-07-22: qty 10

## 2018-07-22 MED ORDER — LIDOCAINE 2% (20 MG/ML) 5 ML SYRINGE
INTRAMUSCULAR | Status: AC
  Filled 2018-07-22: qty 5

## 2018-07-22 MED ORDER — MELATONIN 3 MG PO TABS
3.0000 mg | ORAL_TABLET | Freq: Every evening | ORAL | Status: DC | PRN
  Administered 2018-07-24: 3 mg via ORAL
  Filled 2018-07-22 (×2): qty 1

## 2018-07-22 MED ORDER — CHLORHEXIDINE GLUCONATE 4 % EX LIQD: 60.0000 mL | Freq: Once | CUTANEOUS | Status: DC

## 2018-07-22 MED ORDER — ONDANSETRON HCL 4 MG/2ML IJ SOLN
INTRAMUSCULAR | Status: AC
  Filled 2018-07-22: qty 2

## 2018-07-22 MED ORDER — KETAMINE HCL 50 MG/5ML IJ SOSY
PREFILLED_SYRINGE | INTRAMUSCULAR | Status: AC
  Filled 2018-07-22: qty 5

## 2018-07-22 MED ORDER — ONDANSETRON HCL 4 MG/2ML IJ SOLN
INTRAMUSCULAR | Status: DC | PRN
  Administered 2018-07-22: 4 mg via INTRAVENOUS

## 2018-07-22 MED ORDER — POTASSIUM CHLORIDE CRYS ER 10 MEQ PO TBCR
10.0000 meq | EXTENDED_RELEASE_TABLET | Freq: Every day | ORAL | Status: DC
  Administered 2018-07-23: 10 meq via ORAL
  Filled 2018-07-22: qty 1

## 2018-07-22 MED ORDER — ACETAMINOPHEN 650 MG RE SUPP: 650.0000 mg | RECTAL | Status: DC | PRN

## 2018-07-22 MED ORDER — MORPHINE SULFATE (PF) 2 MG/ML IV SOLN
1.0000 mg | INTRAVENOUS | Status: DC | PRN
  Administered 2018-07-23 (×2): 1 mg via INTRAVENOUS
  Filled 2018-07-22 (×2): qty 1

## 2018-07-22 MED ORDER — VANCOMYCIN HCL IN DEXTROSE 1-5 GM/200ML-% IV SOLN
1000.0000 mg | INTRAVENOUS | Status: AC
  Administered 2018-07-22: 1000 mg via INTRAVENOUS

## 2018-07-22 MED ORDER — HEMOSTATIC AGENTS (NO CHARGE) OPTIME
TOPICAL | Status: DC | PRN
  Administered 2018-07-22: 1 via TOPICAL

## 2018-07-22 MED ORDER — MENTHOL 3 MG MT LOZG: 1.0000 | LOZENGE | OROMUCOSAL | Status: DC | PRN

## 2018-07-22 MED ORDER — ACETAMINOPHEN 10 MG/ML IV SOLN
1000.0000 mg | Freq: Once | INTRAVENOUS | Status: AC
  Administered 2018-07-22: 1000 mg via INTRAVENOUS

## 2018-07-22 MED ORDER — FLEET ENEMA 7-19 GM/118ML RE ENEM: 1.0000 | ENEMA | Freq: Once | RECTAL | Status: DC | PRN

## 2018-07-22 MED ORDER — PROPOFOL 10 MG/ML IV BOLUS
INTRAVENOUS | Status: AC
  Filled 2018-07-22: qty 20

## 2018-07-22 MED ORDER — METHOCARBAMOL 500 MG PO TABS: 500.0000 mg | ORAL_TABLET | Freq: Three times a day (TID) | ORAL | Status: DC | PRN

## 2018-07-22 MED ORDER — SILVER SULFADIAZINE 1 % EX CREA
1.0000 "application " | TOPICAL_CREAM | Freq: Every day | CUTANEOUS | Status: DC | PRN
  Filled 2018-07-22: qty 85

## 2018-07-22 MED ORDER — LACTULOSE 10 GM/15ML PO SOLN
10.0000 g | Freq: Three times a day (TID) | ORAL | Status: DC
  Administered 2018-07-22 – 2018-07-26 (×9): 10 g via ORAL
  Filled 2018-07-22 (×11): qty 30

## 2018-07-22 MED ORDER — SODIUM CHLORIDE 0.9 % IV SOLN: 250.0000 mL | INTRAVENOUS | Status: DC

## 2018-07-22 MED ORDER — SURGIFOAM 100 EX MISC
CUTANEOUS | Status: DC | PRN
  Administered 2018-07-22: 1 via TOPICAL

## 2018-07-22 MED ORDER — ALUM & MAG HYDROXIDE-SIMETH 200-200-20 MG/5ML PO SUSP: 30.0000 mL | Freq: Four times a day (QID) | ORAL | Status: DC | PRN

## 2018-07-22 MED ORDER — PHENYLEPHRINE 40 MCG/ML (10ML) SYRINGE FOR IV PUSH (FOR BLOOD PRESSURE SUPPORT)
PREFILLED_SYRINGE | INTRAVENOUS | Status: AC
  Filled 2018-07-22: qty 10

## 2018-07-22 MED ORDER — VANCOMYCIN HCL 1000 MG IV SOLR
INTRAVENOUS | Status: DC | PRN
  Administered 2018-07-22: 1000 mg via TOPICAL

## 2018-07-22 MED ORDER — FERROUS SULFATE 325 (65 FE) MG PO TABS: 325.0000 mg | ORAL_TABLET | Freq: Every day | ORAL | Status: DC

## 2018-07-22 MED ORDER — VANCOMYCIN HCL IN DEXTROSE 1-5 GM/200ML-% IV SOLN
INTRAVENOUS | Status: AC
  Administered 2018-07-22: 1000 mg via INTRAVENOUS
  Filled 2018-07-22: qty 200

## 2018-07-22 MED ORDER — LACTATED RINGERS IV SOLN
INTRAVENOUS | Status: DC | PRN
  Administered 2018-07-22: 09:00:00 via INTRAVENOUS

## 2018-07-22 MED ORDER — OXYCODONE HCL 5 MG PO TABS
5.0000 mg | ORAL_TABLET | ORAL | Status: DC | PRN
  Administered 2018-07-25: 5 mg via ORAL
  Filled 2018-07-22: qty 1

## 2018-07-22 MED ORDER — SODIUM CHLORIDE 0.9 % IV SOLN
INTRAVENOUS | Status: DC | PRN
  Administered 2018-07-22: 14:00:00 via INTRAVENOUS

## 2018-07-22 MED ORDER — OXYCODONE HCL ER 10 MG PO T12A
10.0000 mg | EXTENDED_RELEASE_TABLET | Freq: Two times a day (BID) | ORAL | Status: DC
  Administered 2018-07-22: 10 mg via ORAL
  Filled 2018-07-22: qty 1

## 2018-07-22 MED ORDER — SODIUM CHLORIDE 0.9 % IV SOLN
INTRAVENOUS | Status: DC | PRN
  Administered 2018-07-22 (×3): via INTRAVENOUS
  Administered 2018-07-22: 25 ug/min via INTRAVENOUS
  Administered 2018-07-22: 10:00:00 via INTRAVENOUS

## 2018-07-22 MED ORDER — ALBUMIN HUMAN 5 % IV SOLN: 12.5000 g | Freq: Once | INTRAVENOUS | Status: DC

## 2018-07-22 MED ORDER — THROMBIN 20000 UNITS EX KIT
PACK | CUTANEOUS | Status: AC
  Filled 2018-07-22: qty 1

## 2018-07-22 MED ORDER — BUPIVACAINE HCL 0.5 % IJ SOLN
INTRAMUSCULAR | Status: DC | PRN
  Administered 2018-07-22: 7 mL
  Administered 2018-07-22: 10 mL

## 2018-07-22 MED ORDER — BUPIVACAINE HCL (PF) 0.5 % IJ SOLN
INTRAMUSCULAR | Status: AC
  Filled 2018-07-22: qty 30

## 2018-07-22 MED ORDER — CLINDAMYCIN PHOSPHATE 600 MG/50ML IV SOLN
INTRAVENOUS | Status: AC
  Filled 2018-07-22: qty 50

## 2018-07-22 MED ORDER — OXYCODONE HCL 5 MG PO TABS
10.0000 mg | ORAL_TABLET | ORAL | Status: DC | PRN
  Administered 2018-07-22 – 2018-07-23 (×3): 10 mg via ORAL
  Filled 2018-07-22 (×4): qty 2

## 2018-07-22 MED ORDER — ONDANSETRON HCL 8 MG PO TABS
8.0000 mg | ORAL_TABLET | Freq: Three times a day (TID) | ORAL | Status: DC | PRN
  Filled 2018-07-22: qty 1

## 2018-07-22 MED ORDER — LIFITEGRAST 5 % OP SOLN
1.0000 [drp] | Freq: Two times a day (BID) | OPHTHALMIC | Status: DC
  Administered 2018-07-24 – 2018-07-25 (×3): 1 [drp] via OPHTHALMIC

## 2018-07-22 MED ORDER — POLYETHYLENE GLYCOL 3350 17 G PO PACK: 17.0000 g | PACK | Freq: Every day | ORAL | Status: DC | PRN

## 2018-07-22 MED ORDER — VANCOMYCIN HCL 1000 MG IV SOLR
INTRAVENOUS | Status: AC
  Filled 2018-07-22: qty 1000

## 2018-07-22 MED ORDER — LIDOCAINE 2% (20 MG/ML) 5 ML SYRINGE
INTRAMUSCULAR | Status: AC
  Filled 2018-07-22: qty 10

## 2018-07-22 MED ORDER — MONTELUKAST SODIUM 10 MG PO TABS
10.0000 mg | ORAL_TABLET | Freq: Every day | ORAL | Status: DC
  Administered 2018-07-22 – 2018-07-25 (×4): 10 mg via ORAL
  Filled 2018-07-22 (×4): qty 1

## 2018-07-22 MED ORDER — SODIUM CHLORIDE 0.9% FLUSH
3.0000 mL | Freq: Two times a day (BID) | INTRAVENOUS | Status: DC
  Administered 2018-07-23: 3 mL via INTRAVENOUS

## 2018-07-22 MED ORDER — ALBUMIN NICU 5% IV SOLUTION: 3.0000 mL/kg | Freq: Once | INTRAVENOUS | Status: DC

## 2018-07-22 MED ORDER — ALBUTEROL SULFATE (2.5 MG/3ML) 0.083% IN NEBU: 2.5000 mg | INHALATION_SOLUTION | RESPIRATORY_TRACT | Status: DC | PRN

## 2018-07-22 SURGICAL SUPPLY — 75 items
BLADE CLIPPER SURG (BLADE) ×2 IMPLANT
BONE CANC CHIPS 20CC PCAN1/4 (Bone Implant) ×2 IMPLANT
BONE VIVIGEN FORMABLE 5.4CC (Bone Implant) ×2 IMPLANT
BUR MATCHSTICK NEURO 3.0 LAGG (BURR) ×2 IMPLANT
BUR RND FLUTED 2.5 (BURR) IMPLANT
BUR SABER RD CUTTING 3.0 (BURR) IMPLANT
CHIPS CANC BONE 20CC PCAN1/4 (Bone Implant) ×1 IMPLANT
COVER BACK TABLE 80X110 HD (DRAPES) ×2 IMPLANT
COVER MAYO STAND STRL (DRAPES) ×2 IMPLANT
COVER SURGICAL LIGHT HANDLE (MISCELLANEOUS) ×2 IMPLANT
DERMABOND ADVANCED (GAUZE/BANDAGES/DRESSINGS) ×1
DERMABOND ADVANCED .7 DNX12 (GAUZE/BANDAGES/DRESSINGS) ×1 IMPLANT
DRAPE C-ARM 42X72 X-RAY (DRAPES) ×2 IMPLANT
DRAPE C-ARMOR (DRAPES) ×2 IMPLANT
DRAPE MICROSCOPE LEICA (MISCELLANEOUS) ×2 IMPLANT
DRAPE SURG 17X23 STRL (DRAPES) ×8 IMPLANT
DRSG MEPILEX BORDER 4X4 (GAUZE/BANDAGES/DRESSINGS) ×2 IMPLANT
DRSG MEPILEX BORDER 4X8 (GAUZE/BANDAGES/DRESSINGS) ×2 IMPLANT
DURAPREP 26ML APPLICATOR (WOUND CARE) ×2 IMPLANT
ELECT BLADE 4.0 EZ CLEAN MEGAD (MISCELLANEOUS) ×2
ELECT BLADE 6.5 EXT (BLADE) IMPLANT
ELECT CAUTERY BLADE 6.4 (BLADE) ×2 IMPLANT
ELECT REM PT RETURN 9FT ADLT (ELECTROSURGICAL) ×2
ELECTRODE BLDE 4.0 EZ CLN MEGD (MISCELLANEOUS) ×1 IMPLANT
ELECTRODE REM PT RTRN 9FT ADLT (ELECTROSURGICAL) ×1 IMPLANT
EVACUATOR 1/8 PVC DRAIN (DRAIN) ×2 IMPLANT
GAUZE SPONGE 4X4 12PLY STRL LF (GAUZE/BANDAGES/DRESSINGS) ×2 IMPLANT
GLOVE BIOGEL PI IND STRL 8 (GLOVE) ×1 IMPLANT
GLOVE BIOGEL PI INDICATOR 8 (GLOVE) ×1
GLOVE SS PI 9.0 STRL (GLOVE) ×8 IMPLANT
GLOVE SURG SS PI 6.5 STRL IVOR (GLOVE) ×2 IMPLANT
GLOVE SURG SS PI 7.0 STRL IVOR (GLOVE) ×4 IMPLANT
GLOVE SURG SS PI 7.5 STRL IVOR (GLOVE) ×6 IMPLANT
GOWN STRL REUS W/ TWL LRG LVL3 (GOWN DISPOSABLE) ×1 IMPLANT
GOWN STRL REUS W/TWL 2XL LVL3 (GOWN DISPOSABLE) ×8 IMPLANT
GOWN STRL REUS W/TWL LRG LVL3 (GOWN DISPOSABLE) ×1
KIT BASIN OR (CUSTOM PROCEDURE TRAY) ×2 IMPLANT
KIT POSITION SURG JACKSON T1 (MISCELLANEOUS) ×2 IMPLANT
KIT TURNOVER KIT B (KITS) ×2 IMPLANT
MANIFOLD NEPTUNE II (INSTRUMENTS) ×2 IMPLANT
NEEDLE 22X1 1/2 (OR ONLY) (NEEDLE) ×4 IMPLANT
NEEDLE SPNL 18GX3.5 QUINCKE PK (NEEDLE) ×2 IMPLANT
NS IRRIG 1000ML POUR BTL (IV SOLUTION) ×2 IMPLANT
PACK LAMINECTOMY ORTHO (CUSTOM PROCEDURE TRAY) ×2 IMPLANT
PAD ARMBOARD 7.5X6 YLW CONV (MISCELLANEOUS) ×4 IMPLANT
PATTIES SURGICAL .75X.75 (GAUZE/BANDAGES/DRESSINGS) IMPLANT
PATTIES SURGICAL 1X1 (DISPOSABLE) ×2 IMPLANT
ROD EXPEDIUM PRE BENT 55MM (Rod) ×4 IMPLANT
SCREW CORT FIX FEN 5.5X7X35MM (Screw) ×2 IMPLANT
SCREW CORT FIX FEN 5.5X7X40MM (Screw) ×2 IMPLANT
SCREW SET SINGLE INNER (Screw) ×12 IMPLANT
SCREW VIPER 7X45MM (Screw) ×2 IMPLANT
SCREW VIPER 7X50MM (Screw) ×8 IMPLANT
SPACER CONCORDE PRO 9X11X27 (Spacer) ×2 IMPLANT
SPACER CONCORDE PRO 9X9X27 (Spacer) ×4 IMPLANT
SPONGE LAP 4X18 RFD (DISPOSABLE) ×4 IMPLANT
SPONGE SURGIFOAM ABS GEL 100 (HEMOSTASIS) ×2 IMPLANT
SURGIFLO W/THROMBIN 8M KIT (HEMOSTASIS) IMPLANT
SUT VIC AB 0 CT1 27 (SUTURE) ×1
SUT VIC AB 0 CT1 27XBRD ANBCTR (SUTURE) ×1 IMPLANT
SUT VIC AB 1 CTX 36 (SUTURE) ×2
SUT VIC AB 1 CTX36XBRD ANBCTR (SUTURE) ×2 IMPLANT
SUT VIC AB 2-0 CT1 27 (SUTURE) ×1
SUT VIC AB 2-0 CT1 TAPERPNT 27 (SUTURE) ×1 IMPLANT
SUT VIC AB 3-0 X1 27 (SUTURE) ×2 IMPLANT
SYR 20CC LL (SYRINGE) IMPLANT
SYR CONTROL 10ML LL (SYRINGE) ×4 IMPLANT
TAP CANN VIPER2 DL 5.0 (TAP) ×2 IMPLANT
TAP CANN VIPER2 DL 6.0 (TAP) ×2 IMPLANT
TAP CANN VIPER2 DL 7.0 (TAP) ×2 IMPLANT
TOWEL GREEN STERILE (TOWEL DISPOSABLE) ×2 IMPLANT
TOWEL GREEN STERILE FF (TOWEL DISPOSABLE) ×2 IMPLANT
TRAY FOLEY BAG SILVER LF 16FR (CATHETERS) ×2 IMPLANT
WATER STERILE IRR 1000ML POUR (IV SOLUTION) ×2 IMPLANT
YANKAUER SUCT BULB TIP NO VENT (SUCTIONS) ×2 IMPLANT

## 2018-07-22 NOTE — Anesthesia Postprocedure Evaluation (Signed)
Anesthesia Post Note  Patient: Neena RhymesDonald Maultsby  Procedure(s) Performed: TRANSFORAMINAL LUMBAR INTERBODY FUSION LEFT  L4-5 AND RIGHT L5-S1 WITH PEDICLE SCREWS, RODS, CAGES, LOCAL AND ALLOGRAFT BONE GRAFT, VIVIGEN (Spine Lumbar)     Patient location during evaluation: PACU Anesthesia Type: General Level of consciousness: sedated Pain management: pain level controlled Vital Signs Assessment: post-procedure vital signs reviewed and stable Respiratory status: spontaneous breathing and respiratory function stable Cardiovascular status: stable Postop Assessment: no apparent nausea or vomiting Anesthetic complications: no    Last Vitals:  Vitals:   07/22/18 2020 07/22/18 2042  BP: (!) 101/59 103/66  Pulse:  67  Resp:  18  Temp:  (!) 36.4 C  SpO2:  98%    Last Pain:  Vitals:   07/22/18 2042  TempSrc: Oral  PainSc:                  Makinzi Prieur DANIEL

## 2018-07-22 NOTE — Discharge Instructions (Addendum)

## 2018-07-22 NOTE — Anesthesia Procedure Notes (Signed)
Procedure Name: Intubation Date/Time: 07/22/2018 7:58 AM Performed by: Lovie Cholock, Jaymie Mckiddy K, CRNA Pre-anesthesia Checklist: Patient identified, Emergency Drugs available, Suction available and Patient being monitored Patient Re-evaluated:Patient Re-evaluated prior to induction Oxygen Delivery Method: Circle System Utilized Preoxygenation: Pre-oxygenation with 100% oxygen Induction Type: IV induction Ventilation: Mask ventilation without difficulty Laryngoscope Size: Miller and 3 Grade View: Grade I Tube type: Oral Tube size: 7.5 mm Number of attempts: 1 Airway Equipment and Method: Stylet Placement Confirmation: ETT inserted through vocal cords under direct vision,  positive ETCO2 and breath sounds checked- equal and bilateral Secured at: 23 cm Tube secured with: Tape Dental Injury: Teeth and Oropharynx as per pre-operative assessment

## 2018-07-22 NOTE — Op Note (Signed)
07/22/2018  4:55 PM  PATIENT:  Michael [name]  52 y.o. male  MRN: [id number]  OPERATIVE REPORT  PRE-OPERATIVE DIAGNOSIS:  L4-5 and L5-S1 degenerative disc disease with spinal stenosis  POST-OPERATIVE DIAGNOSIS:  L4-5 and L5-S1 degenerative disc disease with spinal stenosis  PROCEDURE:  Procedure(s): TRANSFORAMINAL LUMBAR INTERBODY FUSION LEFT  L4-5 AND RIGHT L5-S1 WITH PEDICLE SCREWS, RODS, CAGES, LOCAL AND ALLOGRAFT BONE GRAFT, VIVIGEN    SURGEON:  Jessy Oto, MD     ASSISTANT:  Benjiman Core, PA-C  (Present throughout the entire procedure and necessary for completion of procedure in a timely manner)     ANESTHESIA:  General,supplemented with local marcaine 0.5% 1:1 exparel 1.3% total 30cc, [employer], CRNFA.    COMPLICATIONS:  None.   EBL: 450 CC  CELL SAVER BLOOD RETURNED:150CC  DRAINS: Foley to SD, Hemovac medium one lead right lumbar.    COMPONENTS  Implant Name Type Inv. Item Serial No. Manufacturer Lot No. LRB No. Used  SCREW VIPER 7X50MM - [id number] Screw SCREW VIPER 7X50MM  [employer]   4  SCREW SET SINGLE INNER - [id number] Screw SCREW SET SINGLE INNER  [employer]   6  SCREW CORT FIX FEN 5.5X7X40MM - [id number] Screw SCREW CORT FIX FEN 5.5X7X40MM  [employer]   1  SCREW CORT FIX FEN 5.5X7X35MM - [id number] Screw SCREW CORT FIX FEN 5.5X7X35MM  [employer]   1  SCREW VIPER 7X45MM - [id number] Screw SCREW VIPER 7X45MM  [employer]   1  BONE VIVIGEN FORMABLE 5.4CC - [id number] Bone Implant BONE VIVIGEN FORMABLE 5.4CC [id number] [facility]   1  SPACER CONCORDE PRO [id number] - [id number] Spacer SPACER CONCORDE PRO [id number]  [employer] [id number]  1  SPACER CONCORDE PRO [id number] - [id number] Spacer SPACER CONCORDE PRO [id number]  [employer] 132732  1  BONE CANC CHIPS 20CC - [id number] Bone Implant BONE CANC CHIPS 20CC [id number] [employer] [facility]   1   SPACER CONCORDE PRO 9X9X27 - [id number] Spacer SPACER CONCORDE PRO [id number]  [employer] 132732  1  ROD EXPEDIUM PRE BENT 55MM - [id number] Rod ROD EXPEDIUM PRE BENT 55MM  [employer]   2  :   PROCEDURE: The patient was met in the holding area, and the appropriate lumbar levels right L4-5 and L5-S1 identified and marked with an "X" and my initials. I had discussion with the patient in the preop holding area regarding a change of consent form.The fusion levels are reidentified as Left L4-5 and L5-S1. Patient understands the rationale to perform TLIFs at two levels to decompress the left L4-5 and right L5-S1 lateral recess and foramenal stenosis and to allow for some improved correction of his overall lumbar lordosis. The patient was then transported to OR and was placed under general anestheticwithout difficulty. The patient received appropriate preoperative antibiotic prophylaxis 2 gm Ancef.  Nursing staff inserted a Foley catheter under sterile conditions. The patient was then turned to a prone position using the Coleman spine frame. PAS. all pressure points well padded the arms at the side to 90 90. Standard prep with DuraPrep solution draped in the usual manner from the lower dorsal spine the mid sacral segment. Iodine Vi-Drape was used and the old incision scar was marked. Time-out procedure was called and correct. Skin in the midline between L3and S1 was then infiltrated with local anesthesia, marcaine 1/2% 1:1 exparel  1.3% total 20 cc used. Incision was then made  extending from L3-S2  through the skin and subcutaneous layers down to the patient's lumbodorsal fascia and spinous processes. The incision then carried sharply excising the supraspinous ligament and then continuing the lateral aspect of the spinous processes of L3, L4, L5 and S1. The central laminectomy inferior L4-5 was carefully exposed. Cobb elevator used to carefully elevate the paralumbar muscles off of the  posterior elements using electrocautery carefully drilled bleeding and perform dissection of the muscle tissues of the preserving the facet capsule at the L3-4. Continuing the exposure out laterally to expose the lateral margin of the facet joint line at L3-4, L4-5 and L5-S1. Incision was carried in the midline down to the S1 level area bleeders controlled using electrocautery monopolar electrocautery.  Insight self retaining retractor was used for the lower part of the incision and the cerebellar retractor cranially. C-arm fluoroscopy was then brought into the field and using C-arm fluoroscopy then a hole made into the medial aspect of the left pedicle of L4 observed in the pedicle using C arm at the 5 oclock position on the left L4 pedicle nerve probe initial entry was determined on fluoroscopy to be good position alignment so that a 4.35 mm tap was passed to 50 mm within the left L4 pedicle to a depth of nearly 50 mm observed on C-arm fluoroscopy to be beyond the midpoint of the lumbar vertebra and then position alignment within the left L4 pedicle this was then removed and the pedicle channel probed demonstrating patency no sign of rupture the cortex of the pedicle. Tapping with a 4 mm screw tap then 5 mm tap, then 6.88m tap and then a 7.0 mm tap, then 7.0 mm x 50 mm screw  was chosen to be placed later following decompression and TLIF. .Marland KitchenC-arm fluoroscopy was then brought into the field and using C-arm fluoroscopy then a hole made into the posterior medial aspect of the pedicle of right L4 observed in the pedicle using ball tipped nerve hook and hockey stick nerve probe initial entry was determined on fluoroscopy to be good position alignment so that 4.35 mm tap was then used to tap the right L4 pedicle to a depth of nearly 50 mm observed on C-arm fluoroscopy to be beyond the midpoint of the lumbar vertebra and then position alignment within the right L4 pedicle this was then removed and the pedicle channel  probed demonstrating patency no sign of rupture the cortex of the pedicle. Tapping with a 5 mm screw tap, then a 6.0 mm tap and then a 7.0 mm tap, then a 7.0 mm x 50 mm screw was placed on the right side at the L4 level. C-arm fluoroscopy was then brought into the field and using C-arm fluoroscopy then a hole made into the posterior and medial aspect of the left pedicle of L5 observed in the pedicle using ball tipped nerve hook and hockey stick nerve probe initial entry was determined on fluoroscopy to be good position alignment so that a 4.049mtap was then used to tap the left L5 pedicle to a depth of nearly 50 mm observed on C-arm fluoroscopy to be beyond the posterior one third of the lumbar vertebra and good position alignment within the left L5 pedicle this was then removed and the pedicle channel probed demonstrating patency no sign of rupture the cortex of the pedicle. Tapping with a 5 mm screw tap, then a 6.0 mm tap and then a  7.66m tap then 7.065mx 50 mm screw  was chosen to be placed at the left L4 later following decompression and TLIF. . Marland Kitchenhe pedicle channel of L5 on the left probed demonstrating patency no sign of rupture the cortex of the pedicle. C-arm fluoroscopy was then brought into the field and using C-arm fluoroscopy then a hole made into the posterior and medial aspect of the right pedicle of L5 observed in the pedicle using ball tipped nerve hook and hockey stick nerve probe initial entry was determined on fluoroscopy to be good position alignment so that a 4.65m15map was then used to tap the left L5 pedicle to a depth of nearly 50 mm observed on C-arm fluoroscopy to be beyond the posterior one third of the lumbar vertebra and good position alignment within the right L5 pedicle this was then removed and the pedicle channel probed demonstrating patency no sign of rupture the cortex of the pedicle. Tapping with a 5 mm screw tap, and then a 6.0 mm tap and then a 7.0 mm tap,  Then a 7.65mm73m50 mm  screw was chosen to be placed later following decompression and TLIF. . ThMarland Kitchen pedicle channel of L5 on the right probed demonstrating patency no sign of rupture the cortex of the pedicle. Viper screw for fixation of this level was measured as 7.0 mm x 50 mm screw left for insertion after decompression and TLIF. C-arm fluoroscopy was then brought into the field and using C-arm fluoroscopy then the hole into the pedicle of left S1 was placed and observed with ball-tipped probe the S1 pedicle on this side was 6.0 mm x 35 mm. A 6.65mm 69m5mmc6mully passed down the center of the S1 pedicle to a depth of nearly 35 mm. Observed on C-arm fluoroscopy to be in good position alignment channel was probed with a ball-tipped probe ensure patency no sign of cortical disruption. Following tapping with a 5 mm tap, the a 6.0 mm tap and a 7.65mm x 55mmm screw  was chosen to be placed later following decompression and TLIF at the left side pedicle at S1. C-arm fluoroscopy was used to localize the hole made in the medial aspect of the pedicle of S1 on the right localizing the pedicle within the spinal canal with nerve hook and hockey-stick nerve probe carefully passed down the center of the S1 pedicle to a depth of nearly 40 mm. Observed on C-arm fluoroscopy to be in good position alignment channel was probed with a ball-tipped probe ensure predicle screw  to be placed on the right side at the S1 level following decompression and TLIFs..C-arm fluoroscopy was used to localize the hole made in the lateral aspect of the pedicle of S1 on the right localizing the pedicle within the spinal canal with nerve hook and hockey-stick nerve probe re patency no sign of cortical disruption. Following tapping with a 4 mm, 5mm and4mmm taps a 6.0 x 45 mm screw was placed on the table to be inserted following decompression and TLIF at the right L5-S1.   Spinous processes of lower 60 % of L4 and the entire L5 were then resected down to the base the lamina  at each segment a Leksell rongeur used to resect inferior aspect of the lamina on the left side at the L4 level and partially on the right side at L4. The margins of the L4-5 central laminectomy The left medial 40% of the facet of L4-5 and L5-S1 were resected in  order to decompress the left side of the lumbar thecal sac at  L4-5 and L5-S1 and decompressing the left L4 and L5 and S1 neuroforamen. Osteotomes and 64m and 342mkerrisons were used for this portion of the decompression. A near complete facetecomy on the left side at L4-5. Similarly the right side decompression was carried out but complete facetectomies were perform on the right at L5-S1 to provide for exposure of the right side L5-S1 neuroforamen for ease of placement of TLIF (transforaminal lumbar interbody fusion) at the L5-S1 level inferior portions of the lamina and pars were also resected first beginning with the Leksell rongeur and osteotomes and then resecting using 2 and 3 mm Kerrison. Continued laminectomy was carried out resecting the central portions of the lamina of  L5 performing foraminotomies on the right side at the L5 and S1levels. The inferior articular process left L4 and right L5 were resected. The L5 nerve root identified bilaterally and the medial aspect of the L5 pedicle. Superior articular process of left L5 was then resected from the left side further decompressing the left L5 nerve and providing for exposure of the area just superior to the left L5 pedicle for a placement of left L4-5 cage.A large amount of hypertrophic ligmentum flavum was found impressing on the right and left lateral recesses at L4-5 and L5-S1 and narrowing the respective bilateral L4, L5 and S1neuroforamen. Loupe magnification and headlight were used during the initial this portion procedure. The operating room OR microscope sterily draped was brought to the surgical field for the end of the decompression portion of the case. Attention then turned to placement  of the transforaminal lumbar interbody fusion cages. Using a Penfield 4 the right lateral aspect of the thecal sac at the L5-S1 disc space was carefully freed up The thecal sac could then easily be retracted in the posterior lateral aspect of the L5-S1 disc was exposed 15 blade scalpel used to incise the posterolateral disc and an osteotome used to resect a small portion of bone off the superior aspect of the posterior superior vertebral body of S1 in order to ease the entry into the L5-S1 disc space. A  85m9merrison rongeur was then able to be introduced in the disc space debrided it was quite narrow. 7 mm dilator shaver was used to dialate the L5-S1 disc space on the right side attempts were made to dilate further to 11 mm were successful and using small curettes and the disc space was debrided a minimal degenerative disc present in the endplates debrided to bleeding endplate bone. Shavers were inserted to trial the intervertebral disc space. A 9 mm x 77m47mpuy ProTi concorde cage was carefully packed with morcellized bone graft and the been harvested from previous laminotomies and vivigen.The cage was then inserted with the articulating insertion handle.  Additional local autogenous bone graft and vivigen and cancellous allograft chips was then packed into the intervertebral disc space. Bleeding controlled using bipolar electrocautery thrombin soaked gel cottonoids. Attention then turned to the left L4-5 level similarly the exposure the posterior lateral aspect this was carried out using a Penfield 4 bipolar electrocautery to control small bleeders present. Derricho retractor used to retract the thecal sac and left L4 nerve root a 15 blade scalpel was used to incise posterior lateral aspect of the left L4-5 disc the disc space at this level showed a rather severe narrowing posteriorly was more open anteriorly so that an osteotome again was used to resect a small portion the  posterior superior lip of the  vertebral body at L5  in order to gain ease of access into the L4-5 disc space. The space was debrided of degenerative disc material using pituitary along root the entire disc space was then debrided of degenerative disc material using pituitary rongeurs curettage down to bleeding bone endplates. Curettes both serrated and ring were used to debride the disc space and pituitary ronguers used to remove the loosened debris. This space was then carefully assess using spacers  a 9.67m trial cage provided the best fit, the depuy ProTi concorde cage 9 mm x 27 mm with 5 degrees of lordosis was chosen so that the permanent 9.0 mm cage by 27 mm cage was packed with local bone graft and vivigen was placed into the left L4-5 intervertebral disc space. The posterior intervertebral disc space was packed with autogenous local bone graft as well as vivigen and allograft cancellous chips. Bleeding controlled using bipolar electrocautery.  Observed on C-arm fluoroscopy to be in good position alignment. The cages at L4-5 and L5-S1 were placed anteriorly as best as possible the correct patient's kyphosis that was present. With this then the transforaminal lumbar interbody fusion portion of the case was completed bleeders were controlled using bipolar electrocautery thrombin-soaked Gelfoam were appropriate.Decortication of the facet joints carried out bilateral L4-5 and L5-S1. These were packed with cancellous local bone graft, autograft and vivigen. The left L4, L5 and S1 pedicle screws on the left were then placed, the L5 and S1 viper corticofixation screws on the right were each placed and then each fastener carefully aligned  to allow for placement of rods. The right side first quarter inch precontoured titanium rod was then carefully place. This was then placed into the pedicle screw fasteners on the right extending from L4-S1 each of the caps were carefully placed loosely tightened. Attention turned to the left side were similarly  and then screws were carefully adjusted to allow for a better pattern screws to allow for placement of fixation of the rod a quarter inch 55 mm precontoured titanium rod was then carefully contoured. This was able to be inserted into the left pedicle screw fasteners, Caps onto the L4 fasteners were tightened to 80 foot lbs. Across the right side  L4-5 and L5-S1 screw fasteners compression was obtained on the right side between L4 and L5, then L5 andS1 by compressing between the fasteners and tightening the screw caps 85 pounds. Similarly this was done on the left side at L4-5 and L5-S1 obtaining compression and tightened 85 pounds. Irrigation was carried out with copious amounts of saline solution this was done throughout the case. Cell Saver was used during the case. Hockey stick neuroprobe was used to probe the neuroforamen bilateral L4, L5 and S1, these were determined to be well decompressed. Permanent C-arm images were obtained in AP and lateral plane and oblique planes. Remaining local bone graft was then applied along both lateral posterior lateral region extending from L4 toS1 facet beds.Gelfoam was then removed spinal canal the lumbodorsal musculature carefully exam debrided of any devitalized tissue following removal of Insight retractors were the bleeders were controlled using electrocautery. Powdered vancomycin was then sprinkled into the incision. A medium hemovac drain was placed along the left side of the laminotomy to L3 exiting out the right lower lumbar area. The area dorsal lumbar muscle were then approximated in the midline with interrupted #1 Vicryl sutures loose the dorsal fascia was reattached to the spinous process of L3 to superiorly and  S1 inferiorly this was done with #1 Vicryl sutures. Subcutaneous layers then approximated using interrupted 0 Vicryl sutures and 2-0 Vicryl sutures. Skin was closed with a running subcutaneous stitch of 4-0 Vicryl Dermabond was applied then MedPlex bandage.  All instrument and sponge counts were correct. The patient was then returned to a supine position on her bed reactivated extubated and returned to the recovery room in satisfactory condition.   Benjiman Core, PA-C perform the duties of assistant surgeon during this case. He was present from the beginning of the case to the end of the case assisting in transfer the patient from his stretcher to the OR table and back to the stretcher at the end of the case. Assisted in careful retraction and suction of the laminectomy site delicate neural structures operating under the operating room microscope. He performed closure of the incision from the fascia to the skin applying the dressing.         Basil Dess  07/22/2018, 4:55 PM

## 2018-07-22 NOTE — Interval H&P Note (Signed)
History and Physical Interval Note:  07/22/2018 7:32 AM  Michael [name]  has presented today for surgery, with the diagnosis of L4-5 and L5-S1 degenerative disc disease with spinal stenosis  The various methods of treatment have been discussed with the patient and family. After consideration of risks, benefits and other options for treatment, the patient has consented to  Procedure(s): TRANSFORAMINAL LUMBAR INTERBODY FUSION BILATERAL L4-5 AND RIGHT L5-S1 WITH PEDICLE SCREWS, RODS, CAGES, LOCAL AND ALLOGRAFT BONE GRAFT, VIVIGEN (N/A) as a surgical intervention .  The patient's history has been reviewed, patient examined, no change in status, stable for surgery.  I have reviewed the patient's chart and labs.  Questions were answered to the patient's satisfaction.     Vira BrownsJames Ziyonna Christner

## 2018-07-22 NOTE — Brief Op Note (Signed)
07/22/2018  4:00 PM  PATIENT:  Michael [name]  52 y.o. male  PRE-OPERATIVE DIAGNOSIS:  L4-5 and L5-S1 degenerative disc disease with spinal stenosis  POST-OPERATIVE DIAGNOSIS:  L4-5 and L5-S1 degenerative disc disease with spinal stenosis  PROCEDURE:  Procedure(s): TRANSFORAMINAL LUMBAR INTERBODY FUSION LEFT  L4-5 AND RIGHT L5-S1 WITH PEDICLE SCREWS, RODS, CAGES, LOCAL AND ALLOGRAFT BONE GRAFT, VIVIGEN  SURGEON:  Surgeon(s) and Role:     Kerrin ChampagneNitka, Diania Co E, MD - Primary  PHYSICIAN ASSISTANT: Andee LinemanJames Owens,PA-C  ANESTHESIA:   local and general  EBL:  450 mL   BLOOD ADMINISTERED:150 CC PRBC  DRAINS: (One medium) Hemovact drain(s) in the right lumbar with  Suction Open and Urinary Catheter (Foley)   LOCAL MEDICATIONS USED:  MARCAINE 0.5% 1:1 EXPAREL 1.3% Amount: 30 ml  SPECIMEN:  No Specimen  DISPOSITION OF SPECIMEN:  N/A  COUNTS:  YES  TOURNIQUET:  * No tourniquets in log *  DICTATION: .Dragon Dictation  PLAN OF CARE: Admit to inpatient   PATIENT DISPOSITION:  PACU - hemodynamically stable.   Delay start of Pharmacological VTE agent (>24hrs) due to surgical blood loss or risk of bleeding: yes

## 2018-07-22 NOTE — Transfer of Care (Signed)
Immediate Anesthesia Transfer of Care Note  Patient: Michael [name]  Procedure(s) Performed: TRANSFORAMINAL LUMBAR INTERBODY FUSION LEFT  L4-5 AND RIGHT L5-S1 WITH PEDICLE SCREWS, RODS, CAGES, LOCAL AND ALLOGRAFT BONE GRAFT, VIVIGEN (Spine Lumbar)  Patient Location: PACU  Anesthesia Type:General  Level of Consciousness: oriented, sedated, drowsy and patient cooperative  Airway & Oxygen Therapy: Patient Spontanous Breathing and Patient connected to face mask oxygen  Post-op Assessment: Report given to RN and Post -op Vital signs reviewed and stable  Post vital signs: Reviewed  Last Vitals:  Vitals Value Taken Time  BP 91/59 07/22/2018  4:20 PM  Temp    Pulse 75 07/22/2018  4:25 PM  Resp 18 07/22/2018  4:25 PM  SpO2 97 % 07/22/2018  4:25 PM  Vitals shown include unvalidated device data.  Last Pain:  Vitals:   07/22/18 0629  TempSrc:   PainSc: 2       Patients Stated Pain Goal: 2 (07/22/18 [id number])  Complications: No apparent anesthesia complications

## 2018-07-23 ENCOUNTER — Telehealth (INDEPENDENT_AMBULATORY_CARE_PROVIDER_SITE_OTHER): Payer: Self-pay | Admitting: Specialist

## 2018-07-23 DIAGNOSIS — K7030 Alcoholic cirrhosis of liver without ascites: Secondary | ICD-10-CM

## 2018-07-23 DIAGNOSIS — D62 Acute posthemorrhagic anemia: Secondary | ICD-10-CM | POA: Diagnosis not present

## 2018-07-23 LAB — BASIC METABOLIC PANEL
ANION GAP: 5 (ref 5–15)
BUN: 20 mg/dL (ref 6–20)
CHLORIDE: 107 mmol/L (ref 98–111)
CO2: 18 mmol/L — ABNORMAL LOW (ref 22–32)
Calcium: 7.9 mg/dL — ABNORMAL LOW (ref 8.9–10.3)
Creatinine, Ser: 1.11 mg/dL (ref 0.61–1.24)
Glucose, Bld: 181 mg/dL — ABNORMAL HIGH (ref 70–99)
POTASSIUM: 5.3 mmol/L — AB (ref 3.5–5.1)
SODIUM: 130 mmol/L — AB (ref 135–145)

## 2018-07-23 LAB — PREPARE RBC (CROSSMATCH)

## 2018-07-23 LAB — CBC
HCT: 20.2 % — ABNORMAL LOW (ref 39.0–52.0)
HEMOGLOBIN: 6.8 g/dL — AB (ref 13.0–17.0)
MCH: 33.2 pg (ref 26.0–34.0)
MCHC: 33.7 g/dL (ref 30.0–36.0)
MCV: 98.5 fL (ref 78.0–100.0)
PLATELETS: 67 10*3/uL — AB (ref 150–400)
RBC: 2.05 MIL/uL — AB (ref 4.22–5.81)
RDW: 13.8 % (ref 11.5–15.5)
WBC: 5.5 10*3/uL (ref 4.0–10.5)

## 2018-07-23 MED ORDER — CALCIUM CITRATE 950 (200 CA) MG PO TABS
200.0000 mg | ORAL_TABLET | Freq: Three times a day (TID) | ORAL | Status: DC
  Administered 2018-07-24 – 2018-07-26 (×8): 200 mg via ORAL
  Filled 2018-07-23 (×9): qty 1

## 2018-07-23 MED ORDER — FUROSEMIDE 10 MG/ML IJ SOLN
20.0000 mg | Freq: Once | INTRAMUSCULAR | Status: AC
  Administered 2018-07-23: 20 mg via INTRAVENOUS
  Filled 2018-07-23: qty 4

## 2018-07-23 MED ORDER — MORPHINE SULFATE (PF) 2 MG/ML IV SOLN
2.0000 mg | INTRAVENOUS | Status: DC | PRN
  Administered 2018-07-23 – 2018-07-25 (×8): 2 mg via INTRAVENOUS
  Filled 2018-07-23 (×8): qty 1

## 2018-07-23 MED ORDER — MORPHINE SULFATE (PF) 2 MG/ML IV SOLN
1.0000 mg | INTRAVENOUS | Status: DC | PRN
  Administered 2018-07-23: 1 mg via INTRAVENOUS
  Filled 2018-07-23: qty 1

## 2018-07-23 MED ORDER — ACETAMINOPHEN 325 MG PO TABS
650.0000 mg | ORAL_TABLET | Freq: Once | ORAL | Status: AC
  Administered 2018-07-23: 650 mg via ORAL
  Filled 2018-07-23: qty 2

## 2018-07-23 MED ORDER — FERROUS GLUCONATE 324 (38 FE) MG PO TABS
324.0000 mg | ORAL_TABLET | Freq: Two times a day (BID) | ORAL | Status: DC
  Administered 2018-07-23 – 2018-07-26 (×6): 324 mg via ORAL
  Filled 2018-07-23 (×7): qty 1

## 2018-07-23 MED ORDER — PANTOPRAZOLE SODIUM 40 MG PO TBEC
80.0000 mg | DELAYED_RELEASE_TABLET | Freq: Every day | ORAL | Status: DC
  Administered 2018-07-23 – 2018-07-26 (×4): 80 mg via ORAL
  Filled 2018-07-23 (×4): qty 2

## 2018-07-23 MED ORDER — SODIUM CHLORIDE 0.9% IV SOLUTION
Freq: Once | INTRAVENOUS | Status: AC
  Administered 2018-07-23: 10:00:00 via INTRAVENOUS

## 2018-07-23 MED ORDER — OXYCODONE HCL 5 MG PO TABS
15.0000 mg | ORAL_TABLET | ORAL | Status: DC | PRN
  Administered 2018-07-23 – 2018-07-25 (×6): 15 mg via ORAL
  Filled 2018-07-23 (×7): qty 3

## 2018-07-23 MED ORDER — NICOTINE 21 MG/24HR TD PT24
21.0000 mg | MEDICATED_PATCH | Freq: Every day | TRANSDERMAL | Status: DC
  Administered 2018-07-23 – 2018-07-26 (×4): 21 mg via TRANSDERMAL
  Filled 2018-07-23 (×4): qty 1

## 2018-07-23 MED ORDER — SODIUM CHLORIDE 0.9% IV SOLUTION: Freq: Once | INTRAVENOUS | Status: DC

## 2018-07-23 MED ORDER — DIPHENHYDRAMINE HCL 50 MG/ML IJ SOLN
25.0000 mg | Freq: Once | INTRAMUSCULAR | Status: AC
  Administered 2018-07-23: 25 mg via INTRAVENOUS
  Filled 2018-07-23: qty 1

## 2018-07-23 MED ORDER — OXYCODONE HCL ER 20 MG PO T12A
20.0000 mg | EXTENDED_RELEASE_TABLET | Freq: Two times a day (BID) | ORAL | Status: DC
  Administered 2018-07-23 – 2018-07-26 (×7): 20 mg via ORAL
  Filled 2018-07-23 (×7): qty 1

## 2018-07-23 MED FILL — Thrombin For Soln Kit 20000 Unit: CUTANEOUS | Qty: 1 | Status: AC
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
## 2018-07-23 NOTE — Telephone Encounter (Signed)
Fannie KneeSue called from 3W, Critical lab value 1.8 Hgb, also need to know about Rx for nicotine patches, needs to speak to Dr Otelia SergeantNitka about these.  Call back and ask for Tasha.

## 2018-07-23 NOTE — Telephone Encounter (Signed)
[facility] pertaining to patient lab work. Transferred call to triage line

## 2018-07-23 NOTE — Telephone Encounter (Signed)
Dr. Otelia SergeantNitka has addressed this in his orders in the chart

## 2018-07-23 NOTE — Progress Notes (Signed)
CRITICAL VALUE ALERT  Critical Value:  HGB 6.8  Date & Time Notied:  07/23/2018 @0815   Provider Notified: Dr. Otelia SergeantNitka Office  Orders Received/Actions taken: Nurse well continue to Monitor waiting for orders

## 2018-07-23 NOTE — Telephone Encounter (Signed)
Rodney Boozeasha, [facility] is calling to let Dr. Otelia SergeantNitka know of patients  Hemoglobin - 6.8 She states patient is also requesting a nicotine patch. Tasha's # [phone]

## 2018-07-23 NOTE — Telephone Encounter (Signed)
Patients mom called states that when they got him out of bed he was complaining that his thighs were numb.  He also wants a patch for Nicotine and rx for pain meds

## 2018-07-23 NOTE — Care Management Note (Signed)
Case Management Note  Patient Details  Name: Neena RhymesDonald Kush MRN: [id number] Date of Birth: 03/10/1966  Subjective/Objective:      S/p transforaminal lumbar interbody fusion           Action/Plan: NCM spoke to pt and SO, [name] at bedside. Pt lives in home with SO. Offered choice for HH/list provided. Pt requested [facility]. Contacted [facility] with new referral. Pt requesting Platform RW, explained to discuss with attending and PT about Platform RW. Will need RW and 3n1 for home. Will have NCM order DME on 8/29 with [facility] once clarified by attending.   Expected Discharge Date:                 Expected Discharge Plan:  Home w Home Health Services  In-House Referral:  NA  Discharge planning Services  CM Consult  Post Acute Care Choice:  Home Health Choice offered to:  Patient  DME Arranged:  3-N-1, Scientific laboratory technicianWalker platform, Walker rolling DME Agency:  [facility]  HH Arranged:  PT HH Agency:  [facility] ([facility]  Status of Service:  In process, will continue to follow  If discussed at Long Length of Stay Meetings, dates discussed:    Additional Comments:  Elliot CousinShavis, Makilah Dowda Ellen, RN 07/23/2018, 1:17 PM

## 2018-07-23 NOTE — Progress Notes (Addendum)
     Subjective: 1 Day Post-Op Procedure(s): TRANSFORAMINAL LUMBAR INTERBODY FUSION LEFT  L4-5 AND RIGHT L5-S1 WITH PEDICLE SCREWS, RODS, CAGES, LOCAL AND ALLOGRAFT BONE GRAFT, VIVIGEN Awake, alert and oriented x 4. Tolerating po nourishment. I need more for pain.  Oxycontin dose adjusted and will increase frequency of breakthrough med to every 3 hours.  Told that there will be pain due to recent surgical incision.  On daptomycin, voiding well.  Patient reports pain as marked.    Objective:   VITALS:  Temp:  [97.5 F (36.4 C)-98.7 F (37.1 C)] 98.7 F (37.1 C) (08/28 1541) Pulse Rate:  [61-140] 93 (08/28 1541) Resp:  [5-24] 20 (08/28 1541) BP: (82-118)/(51-76) 114/62 (08/28 1541) SpO2:  [95 %-100 %] 97 % (08/28 1541) Weight:  [89.7 kg] 89.7 kg (08/27 2042)  Neurologically intact ABD soft Neurovascular intact Sensation intact distally Intact pulses distally Dorsiflexion/Plantar flexion intact Incision: scant drainage   LABS Recent Labs    07/23/18 0416  HGB 6.8*  WBC 5.5  PLT 67*   Recent Labs    07/23/18 0416  NA 130*  K 5.3*  CL 107  CO2 18*  BUN 20  CREATININE 1.11  GLUCOSE 181*   No results for input(s): LABPT, INR in the last 72 hours.   Assessment/Plan: 1 Day Post-Op Procedure(s): TRANSFORAMINAL LUMBAR INTERBODY FUSION LEFT  L4-5 AND RIGHT L5-S1 WITH PEDICLE SCREWS, RODS, CAGES, LOCAL AND ALLOGRAFT BONE GRAFT, VIVIGEN  Advance diet Up with therapy Continue ABX therapy due to Post-op infection HIstory of GI bleed last Fall 2018, anemia, may have coagulopathy due to cirrhosis. I will ask [facility] to see this patient. Hold on lab until see by medicine to decrease number of blood draws.  Michael [name] 07/23/2018, 6:17 PMPatient ID: Michael [name], male   DOB: 05/18/1966, 52 y.o.   MRN: [id number]

## 2018-07-23 NOTE — Progress Notes (Signed)
     Subjective: 1 Day Post-Op Procedure(s): TRANSFORAMINAL LUMBAR INTERBODY FUSION LEFT  L4-5 AND RIGHT L5-S1 WITH PEDICLE SCREWS, RODS, CAGES, LOCAL AND ALLOGRAFT BONE GRAFT, VIVIGEN Awake, alert and oriented x 4. Out of bed to a recliner. c/o bilateral foot numbness.Requests nicotine and more pain meds.   Patient reports pain as marked.    Objective:   VITALS:  Temp:  [97 F (36.1 C)-98.4 F (36.9 C)] 98.4 F (36.9 C) (08/28 0804) Pulse Rate:  [61-140] 100 (08/28 0804) Resp:  [5-24] 16 (08/28 0804) BP: (77-118)/(49-76) 118/76 (08/28 0804) SpO2:  [94 %-100 %] 96 % (08/28 0804) Weight:  [89.7 kg] 89.7 kg (08/27 2042)  Neurologically intact ABD soft Neurovascular intact Sensation intact distally Intact pulses distally Dorsiflexion/Plantar flexion intact Incision: moderate drainage and Keep drain as he had vanco powder done. Obtain brace.   LABS Recent Labs    07/23/18 0416  HGB 6.8*  WBC 5.5  PLT PENDING   Recent Labs    07/23/18 0416  NA 130*  K 5.3*  CL 107  CO2 18*  BUN 20  CREATININE 1.11  GLUCOSE 181*   No results for input(s): LABPT, INR in the last 72 hours.   Assessment/Plan: 1 Day Post-Op Procedure(s): TRANSFORAMINAL LUMBAR INTERBODY FUSION LEFT  L4-5 AND RIGHT L5-S1 WITH PEDICLE SCREWS, RODS, CAGES, LOCAL AND ALLOGRAFT BONE GRAFT, VIVIGEN  Advance diet Up with therapy  Transfuse 2 units for anemia due to blood loss.  Vira BrownsJames Nitka 07/23/2018, 9:15 AMPatient ID: Michael [name], male   DOB: 03/23/1966, 52 y.o.   MRN: [id number]

## 2018-07-23 NOTE — Evaluation (Signed)
Physical Therapy Evaluation Patient Details Name: Michael RhymesDonald Sproles MRN: [id number] DOB: 11/17/1966 Today's Date: 07/23/2018   History of Present Illness  Pt is a 52 y.o. M with significant PMH of depression, skin abscess, lumbar laminectomy/decompression now s/p transforaminal lumbar interbody fusion left L4-5 and right L5-S1  Clinical Impression  Patient is s/p above surgery resulting in the deficits listed below (see PT Problem List). Session limited to ambulating to restroom as patient's brace has not yet arrived. Patient currently performing functional mobility at a min guard assist level using walker for external support. Decreased functional mobility secondary to back pain, functional strength deficits, and difficulty walking. Patient will benefit from skilled PT to increase their independence and safety with mobility (while adhering to their precautions) to allow discharge to the venue listed below.     Follow Up Recommendations Home health PT;Supervision for mobility/OOB    Equipment Recommendations  Rolling walker with 5" wheels    Recommendations for Other Services       Precautions / Restrictions Precautions Precautions: Back;Fall Precaution Booklet Issued: Yes (comment) Precaution Comments: verbally reviewed and provided written handout; JP drain Required Braces or Orthoses: Spinal Brace Spinal Brace: Applied in sitting position;Lumbar corset Restrictions Weight Bearing Restrictions: No      Mobility  Bed Mobility Overal bed mobility: Needs Assistance Bed Mobility: Rolling;Sidelying to Sit Rolling: Modified independent (Device/Increase time) Sidelying to sit: Supervision       General bed mobility comments: cues for log roll technique  Transfers Overall transfer level: Needs assistance Equipment used: Rolling walker (2 wheeled) Transfers: Sit to/from Stand Sit to Stand: Min guard         General transfer comment: cues for safe hand placement with transition  from standing to sitting  Ambulation/Gait Ambulation/Gait assistance: Min guard Gait Distance (Feet): 25 Feet Assistive device: Rolling walker (2 wheeled) Gait Pattern/deviations: Step-to pattern;Decreased stride length;Decreased dorsiflexion - right;Decreased dorsiflexion - left Gait velocity: decr Gait velocity interpretation: <1.31 ft/sec, indicative of household ambulator General Gait Details: Patient with very guarded, slow, effortful gait with moderate reliance through BUE's on walker.   Stairs            Wheelchair Mobility    Modified Rankin (Stroke Patients Only)       Balance Overall balance assessment: Needs assistance Sitting-balance support: No upper extremity supported;Feet supported Sitting balance-Leahy Scale: Good     Standing balance support: Bilateral upper extremity supported Standing balance-Leahy Scale: Fair Standing balance comment: able to statically stand without support but needs external support for dynamic balance                             Pertinent Vitals/Pain Pain Assessment: 0-10 Pain Score: 4  Pain Location: surgical site Pain Descriptors / Indicators: Operative site guarding;Grimacing("bloated feeling") Pain Intervention(s): Limited activity within patient's tolerance;Monitored during session;Premedicated before session    Home Living Family/patient expects to be discharged to:: Private residence Living Arrangements: Spouse/significant other Available Help at Discharge: Family Type of Home: House Home Access: Stairs to enter Entrance Stairs-Rails: None("metal frames") Entrance Stairs-Number of Steps: 2(2 steps then landing then 1 step) Home Layout: One level Home Equipment: Cane - single point;Shower seat;Toilet riser      Prior Function Level of Independence: Independent with assistive device(s)         Comments: Occasionally used cane for mobility. Independent with ADL's     Hand Dominance   Dominant  Hand: Right    Extremity/Trunk  Assessment   Upper Extremity Assessment Upper Extremity Assessment: Defer to OT evaluation    Lower Extremity Assessment Lower Extremity Assessment: Overall WFL for tasks assessed    Cervical / Trunk Assessment Cervical / Trunk Assessment: Other exceptions Cervical / Trunk Exceptions: s/p TLIF   Communication   Communication: No difficulties  Cognition Arousal/Alertness: Awake/alert Behavior During Therapy: WFL for tasks assessed/performed Overall Cognitive Status: Within Functional Limits for tasks assessed                                        General Comments      Exercises Other Exercises Other Exercises: Standing hamstring curls with bilateral hand support x 20   Assessment/Plan    PT Assessment Patient needs continued PT services  PT Problem List Decreased strength;Decreased activity tolerance;Decreased range of motion;Decreased balance;Decreased mobility;Pain       PT Treatment Interventions DME instruction;Gait training;Stair training;Functional mobility training;Therapeutic activities;Therapeutic exercise;Balance training;Neuromuscular re-education;Patient/family education    PT Goals (Current goals can be found in the Care Plan section)  Acute Rehab PT Goals Patient Stated Goal: decrease pain PT Goal Formulation: With patient Time For Goal Achievement: 08/06/18 Potential to Achieve Goals: Good    Frequency Min 5X/week   Barriers to discharge        Co-evaluation               AM-PAC PT "6 Clicks" Daily Activity  Outcome Measure Difficulty turning over in bed (including adjusting bedclothes, sheets and blankets)?: A Little Difficulty moving from lying on back to sitting on the side of the bed? : A Little Difficulty sitting down on and standing up from a chair with arms (e.g., wheelchair, bedside commode, etc,.)?: A Little Help needed moving to and from a bed to chair (including a wheelchair)?: A  Little Help needed walking in hospital room?: A Little Help needed climbing 3-5 steps with a railing? : A Lot 6 Click Score: 17    End of Session Equipment Utilized During Treatment: Gait belt Activity Tolerance: Patient tolerated treatment well Patient left: in chair;with call bell/phone within reach;with family/visitor present Nurse Communication: Mobility status PT Visit Diagnosis: Other abnormalities of gait and mobility (R26.89);Difficulty in walking, not elsewhere classified (R26.2);Pain Pain - part of body: (back)    Time: 1610-9604 PT Time Calculation (min) (ACUTE ONLY): 44 min   Charges:   PT Evaluation $PT Eval Moderate Complexity: 1 Mod PT Treatments $Therapeutic Activity: 23-37 mins       Laurina Bustle, PT, DPT Acute Rehabilitation Services  Pager: [phone]   Vanetta Mulders 07/23/2018, 9:15 AM

## 2018-07-23 NOTE — Progress Notes (Signed)
Orthopedic Tech Progress Note Patient Details:  Michael RhymesDonald [name] 07/20/1966 [id number]  Patient ID: Michael [name], male   DOB: 07/03/1966, 52 y.o.   MRN: [id number]   Michael [name], Michael [name] 07/23/2018, 9:03 AM Called in bio-tech brace order; [facility] with [facility]

## 2018-07-23 NOTE — Evaluation (Signed)
Occupational Therapy Evaluation Patient Details Name: Michael [name] MRN: [id number] DOB: 1966-10-13 Today's Date: 07/23/2018    History of Present Illness Pt is a 52 y.o. M with significant PMH of depression, skin abscess, lumbar laminectomy/decompression now s/p transforaminal lumbar interbody fusion left L4-5 and right L5-S1   Clinical Impression   Pt s/p TLIF and now presenting with post op pain and spinal precautions that limit his ability to perform ADLs and functional mobility with PLOF. Mod A required for LB dressing and min guard for ADL transfers. Pt required vc for adherence to spinal precautions during bed mobility. No further OT needs anticipated at d/c d/t family support at home, but pt would benefit from continued OT services during hospital stay.     Follow Up Recommendations  No OT follow up    Equipment Recommendations  None recommended by OT    Recommendations for Other Services PT consult     Precautions / Restrictions Precautions Precautions: Back;Fall Precaution Booklet Issued: Yes (comment) Precaution Comments: verbally reviewed precautions Required Braces or Orthoses: Spinal Brace Spinal Brace: Lumbar corset;Applied in sitting position Restrictions Weight Bearing Restrictions: No      Mobility Bed Mobility Overal bed mobility: Needs Assistance Bed Mobility: Rolling;Sidelying to Sit Rolling: Modified independent (Device/Increase time) Sidelying to sit: Supervision       General bed mobility comments: cues for log roll technique  Transfers Overall transfer level: Needs assistance Equipment used: Rolling walker (2 wheeled) Transfers: Sit to/from [employer] Sit to Stand: Min guard Stand pivot transfers: Min guard(RW)       General transfer comment: vc for UE placement and RW management    Balance Overall balance assessment: Needs assistance Sitting-balance support: No upper extremity supported;Feet supported Sitting  balance-Leahy Scale: Good     Standing balance support: Bilateral upper extremity supported Standing balance-Leahy Scale: Fair Standing balance comment: pt requires UE support for dynamic balance                           ADL either performed or assessed with clinical judgement   ADL Overall ADL's : Needs assistance/impaired Eating/Feeding: Independent   Grooming: Standing;Supervision/safety;Wash/dry hands   Upper Body Bathing: Set up;Sitting;Cueing for safety   Lower Body Bathing: Moderate assistance;Sit to/from stand Lower Body Bathing Details (indicate cue type and reason): mod A for distal LE bathing Upper Body Dressing : Set up;Sitting   Lower Body Dressing: Moderate assistance;Sit to/from stand   Toilet Transfer: Ambulation;Comfort height toilet;Min guard;RW   Toileting- Architect and Hygiene: Min guard;Sit to/from stand       Functional mobility during ADLs: Min guard;Rolling walker       Vision Baseline Vision/History: No visual deficits Patient Visual Report: No change from baseline Vision Assessment?: No apparent visual deficits     Perception     Praxis      Pertinent Vitals/Pain Pain Assessment: 0-10 Pain Score: 4  Pain Location: surgical site Pain Descriptors / Indicators: Operative site guarding;Grimacing Pain Intervention(s): Monitored during session     Hand Dominance Right   Extremity/Trunk Assessment Upper Extremity Assessment Upper Extremity Assessment: Overall WFL for tasks assessed   Lower Extremity Assessment Lower Extremity Assessment: Defer to PT evaluation   Cervical / Trunk Assessment Cervical / Trunk Assessment: Other exceptions Cervical / Trunk Exceptions: s/p TLIF    Communication Communication Communication: No difficulties   Cognition Arousal/Alertness: Awake/alert Behavior During Therapy: WFL for tasks assessed/performed Overall Cognitive Status: Within Functional Limits for  tasks assessed                                      General Comments       Exercises Exercises: Other exercises Other Exercises Other Exercises: Standing hamstring curls with bilateral hand support x 20   Shoulder Instructions      Home Living Family/patient expects to be discharged to:: Private residence Living Arrangements: Spouse/significant other Available Help at Discharge: Family Type of Home: House Home Access: Stairs to enter Secretary/administratorntrance Stairs-Number of Steps: 2 Entrance Stairs-Rails: None Home Layout: One level     Bathroom Shower/Tub: Chief Strategy OfficerTub/shower unit   Bathroom Toilet: Standard     Home Equipment: Cane - single point;Shower seat;Toilet riser          Prior Functioning/Environment Level of Independence: Independent with assistive device(s)        Comments: Occasionally used cane for mobility. Independent with ADL's        OT Problem List: Decreased activity tolerance;Decreased range of motion;Impaired balance (sitting and/or standing);Decreased knowledge of use of DME or AE;Pain;Decreased coordination      OT Treatment/Interventions: Self-care/ADL training;Therapeutic exercise;Energy conservation;Patient/family education;Balance training;Therapeutic activities;DME and/or AE instruction    OT Goals(Current goals can be found in the care plan section) Acute Rehab OT Goals Patient Stated Goal: decrease pain OT Goal Formulation: With patient Time For Goal Achievement: 08/02/18 Potential to Achieve Goals: Good  OT Frequency: Min 2X/week   Barriers to D/C:            Co-evaluation              AM-PAC PT "6 Clicks" Daily Activity     Outcome Measure Help from another person eating meals?: None Help from another person taking care of personal grooming?: None Help from another person toileting, which includes using toliet, bedpan, or urinal?: A Little Help from another person bathing (including washing, rinsing, drying)?: A Little Help from another  person to put on and taking off regular upper body clothing?: A Little Help from another person to put on and taking off regular lower body clothing?: A Lot 6 Click Score: 19   End of Session Equipment Utilized During Treatment: Rolling walker Nurse Communication: Mobility status  Activity Tolerance: Patient tolerated treatment well Patient left: in bed;with bed alarm set;with nursing/sitter in room;with call bell/phone within reach  OT Visit Diagnosis: Unsteadiness on feet (R26.81);Muscle weakness (generalized) (M62.81)                Time: [id number] OT Time Calculation (min): 22 min Charges:  OT General Charges $OT Visit: 1 Visit OT Evaluation $OT Eval Low Complexity: 1 Low  Crissie ReeseSandra H Estefanny Moler OTR/L 07/23/2018, 10:52 AM

## 2018-07-24 ENCOUNTER — Telehealth (INDEPENDENT_AMBULATORY_CARE_PROVIDER_SITE_OTHER): Payer: Self-pay | Admitting: Radiology

## 2018-07-24 DIAGNOSIS — D62 Acute posthemorrhagic anemia: Secondary | ICD-10-CM

## 2018-07-24 DIAGNOSIS — Z419 Encounter for procedure for purposes other than remedying health state, unspecified: Secondary | ICD-10-CM

## 2018-07-24 DIAGNOSIS — Z981 Arthrodesis status: Secondary | ICD-10-CM

## 2018-07-24 DIAGNOSIS — R739 Hyperglycemia, unspecified: Secondary | ICD-10-CM

## 2018-07-24 DIAGNOSIS — M5136 Other intervertebral disc degeneration, lumbar region: Secondary | ICD-10-CM

## 2018-07-24 DIAGNOSIS — D696 Thrombocytopenia, unspecified: Secondary | ICD-10-CM

## 2018-07-24 LAB — COMPREHENSIVE METABOLIC PANEL
ALK PHOS: 69 U/L (ref 38–126)
ALT: 25 U/L (ref 0–44)
ALT: 25 U/L (ref 0–44)
ANION GAP: 8 (ref 5–15)
AST: 45 U/L — ABNORMAL HIGH (ref 15–41)
AST: 43 U/L — ABNORMAL HIGH (ref 15–41)
Albumin: 3.3 g/dL — ABNORMAL LOW (ref 3.5–5.0)
Albumin: 3.1 g/dL — ABNORMAL LOW (ref 3.5–5.0)
Alkaline Phosphatase: 71 U/L (ref 38–126)
Anion gap: 7 (ref 5–15)
BILIRUBIN TOTAL: 1.5 mg/dL — AB (ref 0.3–1.2)
BILIRUBIN TOTAL: 1.7 mg/dL — AB (ref 0.3–1.2)
BUN: 16 mg/dL (ref 6–20)
BUN: 17 mg/dL (ref 6–20)
CALCIUM: 8.8 mg/dL — AB (ref 8.9–10.3)
CALCIUM: 9.2 mg/dL (ref 8.9–10.3)
CHLORIDE: 110 mmol/L (ref 98–111)
CO2: 20 mmol/L — ABNORMAL LOW (ref 22–32)
CO2: 23 mmol/L (ref 22–32)
CREATININE: 0.9 mg/dL (ref 0.61–1.24)
Chloride: 108 mmol/L (ref 98–111)
Creatinine, Ser: 0.97 mg/dL (ref 0.61–1.24)
GLUCOSE: 229 mg/dL — AB (ref 70–99)
Glucose, Bld: 134 mg/dL — ABNORMAL HIGH (ref 70–99)
Potassium: 4.9 mmol/L (ref 3.5–5.1)
Potassium: 4.8 mmol/L (ref 3.5–5.1)
SODIUM: 139 mmol/L (ref 135–145)
Sodium: 137 mmol/L (ref 135–145)
TOTAL PROTEIN: 5.6 g/dL — AB (ref 6.5–8.1)
Total Protein: 6.3 g/dL — ABNORMAL LOW (ref 6.5–8.1)

## 2018-07-24 LAB — CBC WITH DIFFERENTIAL/PLATELET
ABS IMMATURE GRANULOCYTES: 0.1 10*3/uL (ref 0.0–0.1)
Abs Immature Granulocytes: 0.1 10*3/uL (ref 0.0–0.1)
BASOS ABS: 0 10*3/uL (ref 0.0–0.1)
BASOS PCT: 0 %
Basophils Absolute: 0 10*3/uL (ref 0.0–0.1)
Basophils Relative: 0 %
EOS PCT: 0 %
Eosinophils Absolute: 0 10*3/uL (ref 0.0–0.7)
Eosinophils Absolute: 0 10*3/uL (ref 0.0–0.7)
Eosinophils Relative: 0 %
HCT: 27.5 % — ABNORMAL LOW (ref 39.0–52.0)
HCT: 25.7 % — ABNORMAL LOW (ref 39.0–52.0)
HEMOGLOBIN: 8.6 g/dL — AB (ref 13.0–17.0)
Hemoglobin: 9.1 g/dL — ABNORMAL LOW (ref 13.0–17.0)
Immature Granulocytes: 1 %
Immature Granulocytes: 1 %
Lymphocytes Relative: 16 %
Lymphocytes Relative: 20 %
Lymphs Abs: 1.2 10*3/uL (ref 0.7–4.0)
Lymphs Abs: 1.1 10*3/uL (ref 0.7–4.0)
MCH: 31.5 pg (ref 26.0–34.0)
MCH: 30.5 pg (ref 26.0–34.0)
MCHC: 33.5 g/dL (ref 30.0–36.0)
MCHC: 33.1 g/dL (ref 30.0–36.0)
MCV: 94.1 fL (ref 78.0–100.0)
MCV: 92.3 fL (ref 78.0–100.0)
MONO ABS: 1 10*3/uL (ref 0.1–1.0)
MONO ABS: 1.1 10*3/uL — AB (ref 0.1–1.0)
MONOS PCT: 18 %
MONOS PCT: 16 %
NEUTROS ABS: 3.4 10*3/uL (ref 1.7–7.7)
Neutro Abs: 4.7 10*3/uL (ref 1.7–7.7)
Neutrophils Relative %: 67 %
Neutrophils Relative %: 61 %
Platelets: 75 10*3/uL — ABNORMAL LOW (ref 150–400)
Platelets: 78 10*3/uL — ABNORMAL LOW (ref 150–400)
RBC: 2.98 MIL/uL — ABNORMAL LOW (ref 4.22–5.81)
RBC: 2.73 MIL/uL — ABNORMAL LOW (ref 4.22–5.81)
RDW: 19.2 % — ABNORMAL HIGH (ref 11.5–15.5)
RDW: 19.5 % — ABNORMAL HIGH (ref 11.5–15.5)
WBC: 7.1 10*3/uL (ref 4.0–10.5)
WBC: 5.5 10*3/uL (ref 4.0–10.5)

## 2018-07-24 LAB — HEMOGLOBIN A1C
Hgb A1c MFr Bld: 4.9 % (ref 4.8–5.6)
MEAN PLASMA GLUCOSE: 93.93 mg/dL

## 2018-07-24 LAB — DIC (DISSEMINATED INTRAVASCULAR COAGULATION) PANEL
PLATELETS: 77 10*3/uL — AB (ref 150–400)
SMEAR REVIEW: NONE SEEN

## 2018-07-24 LAB — BPAM RBC
BLOOD PRODUCT EXPIRATION DATE: 201909232359
Blood Product Expiration Date: 201909032359
ISSUE DATE / TIME: 201908281830
ISSUE DATE / TIME: 201908281104
UNIT TYPE AND RH: 5100
Unit Type and Rh: 5100

## 2018-07-24 LAB — RETICULOCYTES
RBC.: 2.93 MIL/uL — AB (ref 4.22–5.81)
RETIC COUNT ABSOLUTE: 108.4 10*3/uL (ref 19.0–186.0)
Retic Ct Pct: 3.7 % — ABNORMAL HIGH (ref 0.4–3.1)

## 2018-07-24 LAB — DIC (DISSEMINATED INTRAVASCULAR COAGULATION)PANEL
D-Dimer, Quant: 1.08 ug/mL-FEU — ABNORMAL HIGH (ref 0.00–0.50)
Fibrinogen: 296 mg/dL (ref 210–475)
INR: 1.4
Prothrombin Time: 17 seconds — ABNORMAL HIGH (ref 11.4–15.2)
aPTT: 34 seconds (ref 24–36)

## 2018-07-24 LAB — IRON AND TIBC
Iron: 88 ug/dL (ref 45–182)
SATURATION RATIOS: 39 % (ref 17.9–39.5)
TIBC: 225 ug/dL — ABNORMAL LOW (ref 250–450)
UIBC: 137 ug/dL

## 2018-07-24 LAB — TYPE AND SCREEN
ABO/RH(D): O POS
Antibody Screen: NEGATIVE
UNIT DIVISION: 0
Unit division: 0

## 2018-07-24 LAB — GLUCOSE, CAPILLARY
GLUCOSE-CAPILLARY: 143 mg/dL — AB (ref 70–99)
Glucose-Capillary: 168 mg/dL — ABNORMAL HIGH (ref 70–99)
Glucose-Capillary: 144 mg/dL — ABNORMAL HIGH (ref 70–99)

## 2018-07-24 LAB — VITAMIN B12: VITAMIN B 12: 713 pg/mL (ref 180–914)

## 2018-07-24 LAB — FOLATE: FOLATE: 9.4 ng/mL (ref >5.9)

## 2018-07-24 LAB — OSMOLALITY: Osmolality: 298 mOsm/kg — ABNORMAL HIGH (ref 275–295)

## 2018-07-24 LAB — LACTATE DEHYDROGENASE: LDH: 139 U/L (ref 98–192)

## 2018-07-24 LAB — FERRITIN: Ferritin: 192 ng/mL (ref 24–336)

## 2018-07-24 LAB — TSH: TSH: 2.081 u[IU]/mL (ref 0.350–4.500)

## 2018-07-24 MED FILL — Heparin Sodium (Porcine) Inj 1000 Unit/ML: INTRAMUSCULAR | Qty: 30 | Status: AC

## 2018-07-24 MED FILL — Sodium Chloride IV Soln 0.9%: INTRAVENOUS | Qty: 2000 | Status: AC

## 2018-07-24 NOTE — Progress Notes (Signed)
CONSULT PROGRESS NOTE  Michael RhymesDonald Nary ZOX:[id number] DOB: 12/06/1965 DOA: 07/22/2018 PCP: Ermalinda Memosowlen, Hugh, MD   Consulting physician: Dr. Otelia SergeantNitka Reason for consult: Anemia, hyponatremia, history of cirrhosis, thrombocytopenia  HPI/Recap of past 24 hours: 52 yo M with hx of Spondylolisthesis at L4-L5, Anemia, alcohol induced Cirrhosis.  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  07/24/2018: Patient seen and examined at his bedside.  ComplainTs of lower back pain and constipation which are being addressed.  Assessment/Plan: Principal Problem:   Spinal stenosis of lumbar region Active Problems:   Acute blood loss anemia   Anemia associated with acute blood loss   Degenerative disc disease, lumbar   Status post lumbar spinal fusion   Anemia due to acute blood loss   Thrombocytopenia (HCC)   Hyperglycemia  Hyponatremia, resolved Thrombocytopenia, stable No sign of overt bleeding Platelets 78K from 77K  Acute blood loss anemia Hemoglobin 8.6 from 9.1  No sign of overt bleeding No schistocytes on peripheral smear No sign of hemolysis  Repeated blood sugar suspect secondary to corticosteroid use Globin A1c 4.9% on 07/24/2018  Hypertension Blood pressure stable Continue HCTZ and Inderal  POD #2 post transforaminal lumbar interbody fusion left L4-L5 and right L5-S1 and allograft bone graft Primary managing Pain management and bowel regimen in place  History of alcohol cirrhosis Appears well compensated Continue lactulose  Thank you for the opportunity to participate in the care of this patient.  Will follow the patient with you.   Objective: Vitals:   07/24/18 0304 07/24/18 0535 07/24/18 0847 07/24/18 1152  BP: (!) 92/50  121/65 116/68 110/73  Pulse: 82  97 88  Resp: 18 18 16 20   Temp: 98.4 F (36.9 C)  98.2 F (36.8 C) 98 F (36.7 C)  TempSrc: Oral  Oral Oral  SpO2: 98% 95% 97% 96%  Weight:      Height:        Intake/Output Summary (Last 24 hours) at 07/24/2018 1318 Last data filed at 07/24/2018 1035 Gross per 24 hour  Intake 1032 ml  Output 2750 ml  Net -1718 ml   Filed Weights   07/22/18 0629 07/22/18 2042  Weight: 90 kg 89.7 kg    Exam:  . General: 52 y.o. year-old male well developed well nourished in no acute distress.  Alert and oriented x3. . Cardiovascular: Regular rate and rhythm with no rubs or gallops.  No thyromegaly or JVD noted.   Marland Kitchen. Respiratory: Clear to auscultation with no wheezes or rales. Good inspiratory effort. . Abdomen: Soft nontender nondistended with normal bowel sounds x4 quadrants. . Musculoskeletal: No lower extremity edema. 2/4 pulses in all 4 extremities.  Back brace in place. . Skin: No ulcerative lesions noted or rashes, . Psychiatry: Mood is appropriate for condition and setting   Data Reviewed: CBC: Recent Labs  Lab 07/18/18 1209 07/23/18 0416 07/24/18 0035 07/24/18 0739  WBC 8.1 5.5 7.1 5.5  NEUTROABS  --   --  4.7 3.4  HGB 11.9* 6.8* 9.1* 8.6*  HCT 36.0* 20.2* 27.5* 25.7*  MCV 99.2 98.5 92.3 94.1  PLT 146* 67* 77*  75* 78*   Basic Metabolic Panel: Recent Labs  Lab 07/18/18 1209 07/23/18 0416 07/24/18 0035 07/24/18 0739  NA 136 130* 139 137  K 4.1 5.3* 4.9 4.8  CL  104 107 108 110  CO2 23 18* 23 20*  GLUCOSE 111* 181* 229* 134*  BUN 12 20 17 16   CREATININE 0.87 1.11 0.97 0.90  CALCIUM 10.0 7.9* 9.2 8.8*   GFR: Estimated Creatinine Clearance: 110.4 mL/min (by C-G formula based on SCr of 0.9 mg/dL). Liver Function Tests: Recent Labs  Lab 07/18/18 1209 07/24/18 0035 07/24/18 0739  AST 31 45* 43*  ALT 22 25 25   ALKPHOS 107 71 69  BILITOT 1.7* 1.7* 1.5*  PROT 8.1 6.3* 5.6*  ALBUMIN 4.0 3.3* 3.1*   No results for input(s):  LIPASE, AMYLASE in the last 168 hours. No results for input(s): AMMONIA in the last 168 hours. Coagulation Profile: Recent Labs  Lab 07/18/18 1209 07/24/18 0035  INR 1.25 1.40   Cardiac Enzymes: No results for input(s): CKTOTAL, CKMB, CKMBINDEX, TROPONINI in the last 168 hours. BNP (last 3 results) No results for input(s): PROBNP in the last 8760 hours. HbA1C: Recent Labs    07/24/18 0739  HGBA1C 4.9   CBG: Recent Labs  Lab 07/24/18 1128  GLUCAP 143*   Lipid Profile: No results for input(s): CHOL, HDL, LDLCALC, TRIG, CHOLHDL, LDLDIRECT in the last 72 hours. Thyroid Function Tests: Recent Labs    07/24/18 0739  TSH 2.081   Anemia Panel: Recent Labs    07/24/18 0035  VITAMINB12 713  FOLATE 9.4  FERRITIN 192  TIBC 225*  IRON 88  RETICCTPCT 3.7*   Urine analysis:    Component Value Date/Time   COLORURINE YELLOW 07/18/2018 1210   APPEARANCEUR CLEAR 07/18/2018 1210   LABSPEC 1.013 07/18/2018 1210   PHURINE 7.0 07/18/2018 1210   GLUCOSEU NEGATIVE 07/18/2018 1210   HGBUR NEGATIVE 07/18/2018 1210   BILIRUBINUR NEGATIVE 07/18/2018 1210   BILIRUBINUR neg 05/17/2014 1736   KETONESUR NEGATIVE 07/18/2018 1210   PROTEINUR NEGATIVE 07/18/2018 1210   UROBILINOGEN 1.0 05/17/2014 1736   NITRITE NEGATIVE 07/18/2018 1210   LEUKOCYTESUR NEGATIVE 07/18/2018 1210   Sepsis Labs: @LABRCNTIP (procalcitonin:4,lacticidven:4)  ) Recent Results (from the past 240 hour(s))  Surgical pcr screen     Status: Abnormal   Collection Time: 07/18/18 12:09 PM  Result Value Ref Range Status   MRSA, PCR NEGATIVE NEGATIVE Final   Staphylococcus aureus POSITIVE (A) NEGATIVE Final    Comment: (NOTE) The Xpert SA Assay (FDA approved for NASAL specimens in patients 53 years of age and older), is one component of a comprehensive surveillance program. It is not intended to diagnose infection nor to guide or monitor treatment. Performed at [facility], [address] [place],  [place] [address]       Studies: No results found.  Scheduled Meds: . sodium chloride   Intravenous Once  . calcium citrate  200 mg of elemental calcium Oral TID WC  . dapsone  100 mg Oral Daily  . docusate sodium  100 mg Oral BID  . ferrous gluconate  324 mg Oral BID WC  . gabapentin  300 mg Oral TID  . hydrochlorothiazide  25 mg Oral Daily  . lactulose  10 g Oral TID  . Lifitegrast  1 drop Both Eyes BID  . lisinopril  40 mg Oral Daily  . montelukast  10 mg Oral QHS  . nicotine  21 mg Transdermal Daily  . oxyCODONE  20 mg Oral Q12H  . pantoprazole  80 mg Oral Daily  . propranolol  10 mg Oral BID  . traZODone  200 mg Oral QHS  . zolpidem  10 mg Oral  QHS    Continuous Infusions: . sodium chloride 50 mL/hr at 07/24/18 0236  . methocarbamol (ROBAXIN) IV       LOS: 2 days     Darlin Drop, MD [facility] Pager [phone]  If 7PM-7AM, please contact night-coverage [email or web address] Password [email or web address] 07/24/2018, 1:18 PM

## 2018-07-24 NOTE — Progress Notes (Signed)
Physical Therapy Treatment Patient Details Name: Michael [name] MRN: [id number] DOB: 31-Aug-1966 Today's Date: 07/24/2018    History of Present Illness Pt is a 52 y.o. M with significant PMH of depression, skin abscess, lumbar laminectomy/decompression now s/p transforaminal lumbar interbody fusion left L4-5 and right L5-S1    PT Comments    Patient progressing well towards PT goals. Improved ambulation distance with min guard assist for safety due to bil knee instability. Reviewed 3/3 back precautions and pt able to recall independently but needed cues to maintain during activity. Encouraged ambulation with nursing multiple times today. Pain seems improved. Will continue to follow and progress to stair training next session as tolerated.     Follow Up Recommendations  Home health PT;Supervision for mobility/OOB     Equipment Recommendations  Rolling walker with 5" wheels    Recommendations for Other Services       Precautions / Restrictions Precautions Precautions: Back;Fall Precaution Booklet Issued: Yes (comment) Precaution Comments: verbally reviewed precautions Required Braces or Orthoses: Spinal Brace Spinal Brace: Applied in sitting position;Thoracolumbosacral orthotic Restrictions Weight Bearing Restrictions: No    Mobility  Bed Mobility Overal bed mobility: Needs Assistance Bed Mobility: Rolling;Sidelying to Sit;Sit to Sidelying Rolling: Modified independent (Device/Increase time) Sidelying to sit: Modified independent (Device/Increase time);HOB elevated     Sit to sidelying: Modified independent (Device/Increase time);HOB elevated General bed mobility comments: HOBs lightly elevated; use of rail and increased time to perform. Good demo of log roll technique.  Transfers Overall transfer level: Needs assistance Equipment used: Rolling walker (2 wheeled) Transfers: Sit to/from Stand Sit to Stand: Min guard         General transfer comment: Slow to rise from  bed, despite cues for hand placement, pt using RW to pull up.  Ambulation/Gait Ambulation/Gait assistance: Min guard Gait Distance (Feet): 120 Feet Assistive device: Rolling walker (2 wheeled) Gait Pattern/deviations: Decreased stride length;Step-through pattern;Decreased step length - left;Decreased step length - right Gait velocity: decr   General Gait Details: Slow, guarded and mildly unsteady gait with bil knee instability, heavy reliance on UEs.    Stairs             Wheelchair Mobility    Modified Rankin (Stroke Patients Only)       Balance Overall balance assessment: Needs assistance Sitting-balance support: No upper extremity supported;Feet supported Sitting balance-Leahy Scale: Good Sitting balance - Comments: Able to donn brace independently.    Standing balance support: During functional activity;Bilateral upper extremity supported Standing balance-Leahy Scale: Poor Standing balance comment: pt requires UE support for dynamic balance                            Cognition Arousal/Alertness: Awake/alert Behavior During Therapy: WFL for tasks assessed/performed Overall Cognitive Status: Within Functional Limits for tasks assessed                                        Exercises      General Comments General comments (skin integrity, edema, etc.): Mother present during session. Adjusted RW height for more upright posture.      Pertinent Vitals/Pain Pain Assessment: 0-10 Pain Score: 5  Pain Location: back and BLEs Pain Descriptors / Indicators: Operative site guarding;Discomfort;Numbness Pain Intervention(s): Monitored during session;Repositioned;Premedicated before session    Home Living  Prior Function            PT Goals (current goals can now be found in the care plan section) Progress towards PT goals: Progressing toward goals    Frequency    Min 5X/week      PT Plan Current  plan remains appropriate    Co-evaluation              AM-PAC PT "6 Clicks" Daily Activity  Outcome Measure  Difficulty turning over in bed (including adjusting bedclothes, sheets and blankets)?: None Difficulty moving from lying on back to sitting on the side of the bed? : None Difficulty sitting down on and standing up from a chair with arms (e.g., wheelchair, bedside commode, etc,.)?: A Little Help needed moving to and from a bed to chair (including a wheelchair)?: A Little Help needed walking in hospital room?: A Little Help needed climbing 3-5 steps with a railing? : A Little 6 Click Score: 20    End of Session Equipment Utilized During Treatment: Back brace Activity Tolerance: Patient tolerated treatment well Patient left: in bed;with call bell/phone within reach;with family/visitor present;with SCD's reapplied Nurse Communication: Mobility status PT Visit Diagnosis: Other abnormalities of gait and mobility (R26.89);Difficulty in walking, not elsewhere classified (R26.2);Pain Pain - Right/Left: (both) Pain - part of body: Leg(back)     Time: [phone] PT Time Calculation (min) (ACUTE ONLY): 21 min  Charges:  $Gait Training: 8-22 mins $Therapeutic Activity: 8-22 mins                     BoswellShauna Baylin Gamblin, [place]PT, [place] [phone]     Marcy PanningShauna A Taneshia Lorence 07/24/2018, 9:41 AM

## 2018-07-24 NOTE — Progress Notes (Addendum)
     Subjective: 2 Days Post-Op Procedure(s): TRANSFORAMINAL LUMBAR INTERBODY FUSION LEFT  L4-5 AND RIGHT L5-S1 WITH PEDICLE SCREWS, RODS, CAGES, LOCAL AND ALLOGRAFT BONE GRAFT, VIVIGEN Sleeping on right side, easily awaken. Alert and oriented x4. Pain is controlled with increased oxycontin and increased IR doses. PT and OT. Brace when out of bed. Drain right lumbar with clear serous fluid small amounts. Drain discontinued. Received 2 units of PRBCS yesterday, blood may affect liver function parameters. C/o numbness anterior thigh.  Patient reports pain as moderate.    Objective:   VITALS:  Temp:  [98 F (36.7 C)-98.7 F (37.1 C)] 98.4 F (36.9 C) (08/29 0304) Pulse Rate:  [82-95] 82 (08/29 0304) Resp:  [14-20] 18 (08/29 0535) BP: (92-150)/(50-79) 121/65 (08/29 0535) SpO2:  [91 %-100 %] 95 % (08/29 0535)  Neurologically intact ABD soft Neurovascular intact Sensation intact distally Intact pulses distally Dorsiflexion/Plantar flexion intact Incision: scant drainage No cellulitis present Hemovac drain discontinued, no erythrema or purulence.   LABS Recent Labs    07/23/18 0416 07/24/18 0035  HGB 6.8* 9.1*  WBC 5.5 7.1  PLT 67* 77*  75*   Recent Labs    07/23/18 0416 07/24/18 0035  NA 130* 139  K 5.3* 4.9  CL 107 108  CO2 18* 23  BUN 20 17  CREATININE 1.11 0.97  GLUCOSE 181* 229*   Recent Labs    07/24/18 0035  INR 1.40     Assessment/Plan: 2 Days Post-Op Procedure(s): TRANSFORAMINAL LUMBAR INTERBODY FUSION LEFT  L4-5 AND RIGHT L5-S1 WITH PEDICLE SCREWS, RODS, CAGES, LOCAL AND ALLOGRAFT BONE GRAFT, VIVIGEN Anterior thigh numbness may relate to L4 bilateral nerve decompression or to anterior proximal thigh Local lateral cutaneous nerve compression due to  Support pads used during prolong surgical case. Expect recovery with time.  Anemia, improved. Hyperglycemia.  Change to Carb modified diet. Pain adequately controlled.  Up with therapy D/C IV  fluids Continue ABX therapy due to Chronic Hidadenosis with Staph. Pos swab.  Appreciate [facility] hospitalists help with electrolytes, also glucose intolerance.   Michael [name] 07/24/2018, 8:08 AMPatient ID: Michael Rhymesonald Olesen, male   DOB: 01/25/1966, 52 y.o.   MRN: [id number]

## 2018-07-24 NOTE — Progress Notes (Signed)
Inpatient Diabetes Program Recommendations  AACE/ADA: New Consensus Statement on Inpatient Glycemic Control (2019)  Target Ranges:  Prepandial:   less than 140 mg/dL      Peak postprandial:   less than 180 mg/dL (1-2 hours)      Critically ill patients:  140 - 180 mg/dL   Results for Michael [name], Michael [name] (MRN [id number]) as of 07/24/2018 11:54  Ref. Range 07/24/2018 11:28  Glucose-Capillary Latest Ref Range: 70 - 99 mg/dL 409143 (H)  Results for Michael [name], Michael [name] (MRN [id number]) as of 07/24/2018 11:54  Ref. Range 07/18/2018 12:09 07/23/2018 04:16 07/24/2018 00:35 07/24/2018 07:39  Glucose Latest Ref Range: 70 - 99 mg/dL 956111 (H) 213181 (H) 086229 (H) 134 (H)  Hemoglobin A1C Latest Ref Range: 4.8 - 5.6 %    4.9   Review of Glycemic Control  Diabetes history:No Outpatient Diabetes medications: NA Current orders for Inpatient glycemic control: None  Inpatient Diabetes Program Recommendations: HgbA1C: A1C 4.9% on 07/24/18 indicating good glycemic control (no DM).  NOTE: In reviewing chart, noted patient received Decadron 10 mg on 07/22/18 at 9:30 am. Anticipate Decadron contributed to hyperglycemia noted. A1C 4.9% on 07/24/18 and glucose 111 mg/dl on 5/78/468/23/19.   Thanks, Orlando PennerMarie Kanani Mowbray, RN, MSN, CDE Diabetes Coordinator Inpatient Diabetes Program [phone] (Team Pager from 8am to 5pm)

## 2018-07-24 NOTE — Telephone Encounter (Signed)
Patient called and lmovm @ 12:54pm, stating that he wants to know why he is on the 3rd floor that he wants to be transferred to the 5th floor, and that he is having trouble tracking someone down to get him his pain meds.  Patient called back at 3:08pm and lmovm, states that he really wants to be transferred to the 5th floor where the orthopedics are, as he is currently on the stroke floor.  Also he states that he is having pain around a 5 and states that Dr. Havery MorosNikta needs to increase his pain meds.

## 2018-07-25 LAB — CBC
HCT: 26.8 % — ABNORMAL LOW (ref 39.0–52.0)
Hemoglobin: 8.9 g/dL — ABNORMAL LOW (ref 13.0–17.0)
MCH: 31.3 pg (ref 26.0–34.0)
MCHC: 33.2 g/dL (ref 30.0–36.0)
MCV: 94.4 fL (ref 78.0–100.0)
Platelets: 77 10*3/uL — ABNORMAL LOW (ref 150–400)
RBC: 2.84 MIL/uL — AB (ref 4.22–5.81)
RDW: 19.3 % — ABNORMAL HIGH (ref 11.5–15.5)
WBC: 7.3 10*3/uL (ref 4.0–10.5)

## 2018-07-25 LAB — GLUCOSE, CAPILLARY
GLUCOSE-CAPILLARY: 117 mg/dL — AB (ref 70–99)
Glucose-Capillary: 127 mg/dL — ABNORMAL HIGH (ref 70–99)
Glucose-Capillary: 135 mg/dL — ABNORMAL HIGH (ref 70–99)
Glucose-Capillary: 151 mg/dL — ABNORMAL HIGH (ref 70–99)

## 2018-07-25 LAB — HAPTOGLOBIN: HAPTOGLOBIN: 91 mg/dL (ref 34–200)

## 2018-07-25 MED ORDER — HYDROXYZINE HCL 25 MG PO TABS
25.0000 mg | ORAL_TABLET | ORAL | Status: DC | PRN
  Administered 2018-07-25 – 2018-07-26 (×2): 25 mg via ORAL
  Filled 2018-07-25 (×2): qty 1

## 2018-07-25 MED ORDER — BISACODYL 10 MG RE SUPP
10.0000 mg | Freq: Once | RECTAL | Status: AC
  Administered 2018-07-25: 10 mg via RECTAL
  Filled 2018-07-25: qty 1

## 2018-07-25 MED ORDER — MORPHINE SULFATE (PF) 2 MG/ML IV SOLN
1.0000 mg | INTRAVENOUS | Status: DC | PRN
  Administered 2018-07-25: 1 mg via INTRAVENOUS
  Filled 2018-07-25: qty 1

## 2018-07-25 MED ORDER — SENNOSIDES-DOCUSATE SODIUM 8.6-50 MG PO TABS
2.0000 | ORAL_TABLET | Freq: Two times a day (BID) | ORAL | Status: DC
  Administered 2018-07-25: 2 via ORAL
  Filled 2018-07-25 (×3): qty 2

## 2018-07-25 MED ORDER — POLYETHYLENE GLYCOL 3350 17 G PO PACK
17.0000 g | PACK | Freq: Two times a day (BID) | ORAL | Status: DC
  Administered 2018-07-25: 17 g via ORAL
  Filled 2018-07-25 (×3): qty 1

## 2018-07-25 MED ORDER — OXYCODONE HCL 5 MG PO TABS
20.0000 mg | ORAL_TABLET | ORAL | Status: DC | PRN
  Administered 2018-07-26: 20 mg via ORAL
  Filled 2018-07-25: qty 4

## 2018-07-25 NOTE — Progress Notes (Signed)
Physical Therapy Treatment Patient Details Name: Michael [name] MRN: [id number] DOB: [birth year] Today's Date: 07/25/2018    History of Present Illness Pt is a 52 y.o. M with significant PMH of depression, skin abscess, lumbar laminectomy/decompression now s/p transforaminal lumbar interbody fusion left L4-5 and right L5-S1    PT Comments    Patient lying in bed and appears comfortable and sleeping upon arrival. Pt drowsy during session. Pt requires min guard for OOB mobility given bilat LE weakness. Pt is making progress toward PT goals and able to increase gait distance this session.  Patient needs to practice stairs next session.     Follow Up Recommendations  Home health PT;Supervision for mobility/OOB     Equipment Recommendations  Rolling walker with 5" wheels    Recommendations for Other Services       Precautions / Restrictions Precautions Precautions: Back;Fall Precaution Comments: pt able to recall 3/3 precautions  Required Braces or Orthoses: Spinal Brace Spinal Brace: Applied in sitting position;Thoracolumbosacral orthotic Restrictions Weight Bearing Restrictions: No    Mobility  Bed Mobility Overal bed mobility: Needs Assistance Bed Mobility: Sidelying to Sit   Sidelying to sit: Modified independent (Device/Increase time)       General bed mobility comments: use of bed rail  Transfers Overall transfer level: Needs assistance Equipment used: Rolling walker (2 wheeled) Transfers: Sit to/from Stand Sit to Stand: Min guard         General transfer comment: for safety; cues for safe hand placement  Ambulation/Gait Ambulation/Gait assistance: Min guard Gait Distance (Feet): 200 Feet Assistive device: Rolling walker (2 wheeled) Gait Pattern/deviations: Decreased stride length;Step-through pattern;Decreased step length - left;Decreased step length - right Gait velocity: decr   General Gait Details: heavy reliance on UE support with bilat knee flexion;  cues for posture and bilat knee extension during stance phase   Stairs             Wheelchair Mobility    Modified Rankin (Stroke Patients Only)       Balance Overall balance assessment: Needs assistance Sitting-balance support: No upper extremity supported;Feet supported Sitting balance-Leahy Scale: Good Sitting balance - Comments: Able to donn brace independently.    Standing balance support: During functional activity;Bilateral upper extremity supported Standing balance-Leahy Scale: Poor Standing balance comment: pt requires UE support for dynamic balance                            Cognition Arousal/Alertness: Awake/alert Behavior During Therapy: WFL for tasks assessed/performed Overall Cognitive Status: Within Functional Limits for tasks assessed                                        Exercises      General Comments        Pertinent Vitals/Pain Pain Assessment: Faces Faces Pain Scale: Hurts little more Pain Location: back Pain Descriptors / Indicators: Sore Pain Intervention(s): Limited activity within patient's tolerance;Monitored during session;Premedicated before session;Repositioned    Home Living                      Prior Function            PT Goals (current goals can now be found in the care plan section) Acute Rehab PT Goals Patient Stated Goal: decrease pain PT Goal Formulation: With patient Time For Goal Achievement: 08/06/18 Potential  to Achieve Goals: Good Progress towards PT goals: Progressing toward goals    Frequency    Min 5X/week      PT Plan Current plan remains appropriate    Co-evaluation              AM-PAC PT "6 Clicks" Daily Activity  Outcome Measure  Difficulty turning over in bed (including adjusting bedclothes, sheets and blankets)?: None Difficulty moving from lying on back to sitting on the side of the bed? : None Difficulty sitting down on and standing up from a  chair with arms (e.g., wheelchair, bedside commode, etc,.)?: A Little Help needed moving to and from a bed to chair (including a wheelchair)?: A Little Help needed walking in hospital room?: A Little Help needed climbing 3-5 steps with a railing? : A Little 6 Click Score: 20    End of Session Equipment Utilized During Treatment: Back brace Activity Tolerance: Patient tolerated treatment well Patient left: with call bell/phone within reach;in chair Nurse Communication: Mobility status PT Visit Diagnosis: Other abnormalities of gait and mobility (R26.89);Difficulty in walking, not elsewhere classified (R26.2);Pain Pain - Right/Left: (both) Pain - part of body: Leg(back)     Time: [id number] PT Time Calculation (min) (ACUTE ONLY): 38 min  Charges:  $Gait Training: 23-37 mins $Therapeutic Activity: 8-22 mins                     Erline LevineKellyn Nathania Waldman, PTA Pager: [phone]     Carolynne EdouardKellyn R Chaniah Cisse 07/25/2018, 4:19 PM

## 2018-07-25 NOTE — Progress Notes (Signed)
Occupational Therapy Treatment Patient Details Name: Michael [name] MRN: [id number] DOB: 12-05-1965 Today's Date: 07/25/2018    History of present illness Pt is a 52 y.o. M with significant PMH of depression, skin abscess, lumbar laminectomy/decompression now s/p transforaminal lumbar interbody fusion left L4-5 and right L5-S1   OT comments  Pt progressing towards OT goals this session. Session focused on toilet transfer, UB and LB bathing, brace education, and bed mobility. (please see details below) Current POC remains appropriate. OT will continue to follow acutely.   Follow Up Recommendations  No OT follow up    Equipment Recommendations  None recommended by OT    Recommendations for Other Services      Precautions / Restrictions Precautions Precautions: Back;Fall Precaution Comments: pt able to recall 3/3 precautions  Required Braces or Orthoses: Spinal Brace Spinal Brace: Applied in sitting position;Thoracolumbosacral orthotic Restrictions Weight Bearing Restrictions: No       Mobility Bed Mobility Overal bed mobility: Needs Assistance Bed Mobility: Sit to Sidelying;Rolling Rolling: Modified independent (Device/Increase time)(use of rails) Sidelying to sit: Modified independent (Device/Increase time)     Sit to sidelying: Min assist(for BLE back into bed) General bed mobility comments: use of bed rail  Transfers Overall transfer level: Needs assistance Equipment used: Rolling walker (2 wheeled) Transfers: Sit to/from Stand Sit to Stand: Min guard         General transfer comment: for safety; cues for safe hand placement    Balance Overall balance assessment: Needs assistance Sitting-balance support: No upper extremity supported;Feet supported Sitting balance-Leahy Scale: Good Sitting balance - Comments: Able to donn brace independently.    Standing balance support: During functional activity;Bilateral upper extremity supported Standing balance-Leahy  Scale: Poor Standing balance comment: pt requires UE support for dynamic balance                           ADL either performed or assessed with clinical judgement   ADL Overall ADL's : Needs assistance/impaired     Grooming: Standing;Supervision/safety;Wash/dry hands   Upper Body Bathing: Set up;Sitting;Cueing for safety   Lower Body Bathing: Maximal assistance;Bed level   Upper Body Dressing : Set up;Sitting Upper Body Dressing Details (indicate cue type and reason): to doff brace, education on re-tightning Lower Body Dressing: Maximal assistance Lower Body Dressing Details (indicate cue type and reason): to don socks Toilet Transfer: Min guard;Ambulation;RW   Toileting- Architect and Hygiene: Min guard;Sit to/from stand       Functional mobility during ADLs: Health and safety inspector     Praxis      Cognition Arousal/Alertness: Awake/alert Behavior During Therapy: WFL for tasks assessed/performed Overall Cognitive Status: Within Functional Limits for tasks assessed                                 General Comments: Pt requesting pain meds, but immediately fell asleep upon return to bed        Exercises     Shoulder Instructions       General Comments      Pertinent Vitals/ Pain       Pain Assessment: 0-10 Pain Score: 7  Faces Pain Scale: Hurts little more Pain Location: back Pain Descriptors / Indicators: Sore Pain Intervention(s): Limited activity within patient's tolerance;Monitored during session;Repositioned;Patient requesting pain meds-RN notified  Home Living  Prior Functioning/Environment              Frequency  Min 2X/week        Progress Toward Goals  OT Goals(current goals can now be found in the care plan section)  Progress towards OT goals: Progressing toward goals  Acute Rehab OT Goals Patient  Stated Goal: be able to walk better again OT Goal Formulation: With patient Time For Goal Achievement: 08/02/18 Potential to Achieve Goals: Good  Plan Discharge plan remains appropriate    Co-evaluation                 AM-PAC PT "6 Clicks" Daily Activity     Outcome Measure   Help from another person eating meals?: None Help from another person taking care of personal grooming?: None Help from another person toileting, which includes using toliet, bedpan, or urinal?: A Little Help from another person bathing (including washing, rinsing, drying)?: A Little Help from another person to put on and taking off regular upper body clothing?: A Little Help from another person to put on and taking off regular lower body clothing?: A Lot 6 Click Score: 19    End of Session Equipment Utilized During Treatment: Rolling walker;Back brace  OT Visit Diagnosis: Unsteadiness on feet (R26.81);Muscle weakness (generalized) (M62.81)   Activity Tolerance Patient tolerated treatment well   Patient Left in bed;with bed alarm set;with call bell/phone within reach;with SCD's reapplied   Nurse Communication Mobility status;Patient requests pain meds        Time: [id number] OT Time Calculation (min): 38 min  Charges: OT General Charges $OT Visit: 1 Visit OT Treatments $Self Care/Home Management : 23-37 mins $Therapeutic Activity: 8-22 mins  Sherryl MangesLaura Bretta Fees OTR/L Acute Rehabilitation Services Pager: [phone] Office: [phone]   Evern BioLaura J Nataliee Shurtz 07/25/2018, 6:08 PM

## 2018-07-25 NOTE — Progress Notes (Signed)
Patient ID: Michael [name], male   DOB: 11/01/1966, 52 y.o.   MRN: [id number] If you don't increase narcotics I will kill myself. On oxyIR 15 mg every 3 hours and oxycontin 20 mg po every 12 hours. Will increase to 20 mg every 3 hours, keep oxycontin at 20 mg. Start vistaril to potentiate narcotic without further increase dose. Morphine every 2 hours restarted.  His past history is significant for increased use of  Narcotics post his laminectomy and he never stopped all narcotics over the las one year so that He likely has significant tolerance.

## 2018-07-25 NOTE — Progress Notes (Signed)
CONSULT PROGRESS NOTE  Michael RhymesDonald Noffke RUE:[id number] DOB: 07/27/1966 DOA: 07/22/2018 PCP: Ermalinda Memosowlen, Hugh, MD   Consulting physician: Dr. Otelia SergeantNitka Reason for consult: Anemia, hyponatremia, history of cirrhosis, thrombocytopenia  HPI/Recap of past 24 hours: 52 yo M with hx of Spondylolisthesis at L4-L5, Anemia, alcohol induced Cirrhosis.  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  07/24/2018: Patient seen and examined at his bedside.  ComplainTs of lower back pain and constipation which are being addressed.  07/25/2018: Patient seen and examined with this significant other at bedside.  He is somnolent however arouses to voices and answers questions appropriately.  Reports constipation for 4 days.  Positive flatus. Discontinued docusate and started Senokot twice daily and MiraLAX twice daily.  Already on lactulose 3 times daily for cirrhosis.  He has no other complaints at this time.  Assessment/Plan: Principal Problem:   Spinal stenosis of lumbar region Active Problems:   Acute blood loss anemia   Anemia associated with acute blood loss   Degenerative disc disease, lumbar   Status post lumbar spinal fusion   Anemia due to acute blood loss   Thrombocytopenia (HCC)   Hyperglycemia  Hyponatremia, resolved  Thrombocytopenia, stable No sign of overt bleeding Platelets 77K, stable  Acute blood loss anemia/anemia of chronic disease, stable Hemoglobin 8.9 from 8.6 from 9.1  No sign of overt bleeding No schistocytes on peripheral smear No sign of hemolysis  Opiate-induced constipation Stop docusate Start Senokot 2 tablets twice daily, MiraLAX twice daily Continue lactulose 3 times daily Monitor stool output De-escalate to Senokot 1  tablet daily and MiraLAX daily after bowel movements Dulcolax suppository as needed once a day  Steroid-induced hyperglycemia Hemoglobin A1c 4.9% on 07/24/2018  Hypertension Blood pressure stable Continue HCTZ and Inderal  POD #3 post transforaminal lumbar interbody fusion left L4-L5 and right L5-S1 and allograft bone graft Primary managing Pain management and bowel regimen in place  History of alcohol cirrhosis Appears well compensated Continue lactulose 3 times daily Obtain ammonia level in the morning  Thank you for the opportunity to participate in the care of this patient.  Will follow the patient with you.   Objective: Vitals:   07/25/18 0322 07/25/18 0801 07/25/18 1150 07/25/18 1610  BP: 120/69 111/61 (!) 102/56 (!) 93/53  Pulse: 91 90 90 98  Resp: 18 20 20 15   Temp: 98.3 F (36.8 C) 98.1 F (36.7 C) 98.2 F (36.8 C) 98.9 F (37.2 C)  TempSrc: Oral Oral Oral Oral  SpO2: 98% 95% 97% 97%  Weight:      Height:        Intake/Output Summary (Last 24 hours) at 07/25/2018 1712 Last data filed at 07/25/2018 1337 Gross per 24 hour  Intake 240 ml  Output 1620 ml  Net -1380 ml   Filed Weights   07/22/18 0629 07/22/18 2042  Weight: 90 kg 89.7 kg    Exam:  . General: 52 y.o. year-old male well-developed well-nourished in no acute distress.  Somnolent but arousable to voices and oriented x3. . Cardiovascular:  Regular rate and rhythm with no rubs or gallops.  No thyromegaly or JVD noted.  Respiratory: Clear to auscultation with no wheezes or rales. Good inspiratory effort. . Abdomen: Soft nontender nondistended with normal bowel sounds x4 quadrants. . Musculoskeletal: No lower extremity edema. 2/4  pulses in all 4 extremities.  Surgical dressing noted in mid lower back. . Psychiatry: Mood is appropriate for condition and setting   Data Reviewed: CBC: Recent Labs  Lab 07/23/18 0416 07/24/18 0035 07/24/18 0739 07/25/18 0404  WBC 5.5 7.1 5.5 7.3  NEUTROABS  --   4.7 3.4  --   HGB 6.8* 9.1* 8.6* 8.9*  HCT 20.2* 27.5* 25.7* 26.8*  MCV 98.5 92.3 94.1 94.4  PLT 67* 77*  75* 78* 77*   Basic Metabolic Panel: Recent Labs  Lab 07/23/18 0416 07/24/18 0035 07/24/18 0739  NA 130* 139 137  K 5.3* 4.9 4.8  CL 107 108 110  CO2 18* 23 20*  GLUCOSE 181* 229* 134*  BUN 20 17 16   CREATININE 1.11 0.97 0.90  CALCIUM 7.9* 9.2 8.8*   GFR: Estimated Creatinine Clearance: 110.4 mL/min (by C-G formula based on SCr of 0.9 mg/dL). Liver Function Tests: Recent Labs  Lab 07/24/18 0035 07/24/18 0739  AST 45* 43*  ALT 25 25  ALKPHOS 71 69  BILITOT 1.7* 1.5*  PROT 6.3* 5.6*  ALBUMIN 3.3* 3.1*   No results for input(s): LIPASE, AMYLASE in the last 168 hours. No results for input(s): AMMONIA in the last 168 hours. Coagulation Profile: Recent Labs  Lab 07/24/18 0035  INR 1.40   Cardiac Enzymes: No results for input(s): CKTOTAL, CKMB, CKMBINDEX, TROPONINI in the last 168 hours. BNP (last 3 results) No results for input(s): PROBNP in the last 8760 hours. HbA1C: Recent Labs    07/24/18 0739  HGBA1C 4.9   CBG: Recent Labs  Lab 07/24/18 1701 07/24/18 2130 07/25/18 0610 07/25/18 1115 07/25/18 1700  GLUCAP 168* 144* 117* 127* 135*   Lipid Profile: No results for input(s): CHOL, HDL, LDLCALC, TRIG, CHOLHDL, LDLDIRECT in the last 72 hours. Thyroid Function Tests: Recent Labs    07/24/18 0739  TSH 2.081   Anemia Panel: Recent Labs    07/24/18 0035  VITAMINB12 713  FOLATE 9.4  FERRITIN 192  TIBC 225*  IRON 88  RETICCTPCT 3.7*   Urine analysis:    Component Value Date/Time   COLORURINE YELLOW 07/18/2018 1210   APPEARANCEUR CLEAR 07/18/2018 1210   LABSPEC 1.013 07/18/2018 1210   PHURINE 7.0 07/18/2018 1210   GLUCOSEU NEGATIVE 07/18/2018 1210   HGBUR NEGATIVE 07/18/2018 1210   BILIRUBINUR NEGATIVE 07/18/2018 1210   BILIRUBINUR neg 05/17/2014 1736   KETONESUR NEGATIVE 07/18/2018 1210   PROTEINUR NEGATIVE 07/18/2018 1210    UROBILINOGEN 1.0 05/17/2014 1736   NITRITE NEGATIVE 07/18/2018 1210   LEUKOCYTESUR NEGATIVE 07/18/2018 1210   Sepsis Labs: @LABRCNTIP (procalcitonin:4,lacticidven:4)  ) Recent Results (from the past 240 hour(s))  Surgical pcr screen     Status: Abnormal   Collection Time: 07/18/18 12:09 PM  Result Value Ref Range Status   MRSA, PCR NEGATIVE NEGATIVE Final   Staphylococcus aureus POSITIVE (A) NEGATIVE Final    Comment: (NOTE) The Xpert SA Assay (FDA approved for NASAL specimens in patients 4 years of age and older), is one component of a comprehensive surveillance program. It is not intended to diagnose infection nor to guide or monitor treatment. Performed at [facility], [address] [place], [place] [address]       Studies: No results found.  Scheduled Meds: . sodium chloride   Intravenous Once  . calcium citrate  200 mg of elemental calcium Oral TID WC  . dapsone  100 mg Oral Daily  . ferrous gluconate  324 mg Oral BID WC  .  gabapentin  300 mg Oral TID  . hydrochlorothiazide  25 mg Oral Daily  . lactulose  10 g Oral TID  . Lifitegrast  1 drop Both Eyes BID  . lisinopril  40 mg Oral Daily  . montelukast  10 mg Oral QHS  . nicotine  21 mg Transdermal Daily  . oxyCODONE  20 mg Oral Q12H  . pantoprazole  80 mg Oral Daily  . polyethylene glycol  17 g Oral BID  . propranolol  10 mg Oral BID  . senna-docusate  2 tablet Oral BID  . traZODone  200 mg Oral QHS  . zolpidem  10 mg Oral QHS    Continuous Infusions: . sodium chloride 50 mL/hr at 07/24/18 0236  . methocarbamol (ROBAXIN) IV       LOS: 3 days     Darlin Drop, MD [facility] Pager [phone]  If 7PM-7AM, please contact night-coverage [email or web address] Password [email or web address] 07/25/2018, 5:12 PM

## 2018-07-25 NOTE — Progress Notes (Signed)
Subjective: Patient asleep and snoring when I enter the room this morning.  Pain appears to be controlled at this point.  Very drowsy.  RN states that patient is requesting a lot of pain medication.  Objective: Vital signs in last 24 hours: Temp:  [98 F (36.7 C)-99.4 F (37.4 C)] 98.1 F (36.7 C) (08/30 0801) Pulse Rate:  [88-99] 90 (08/30 0801) Resp:  [18-20] 20 (08/30 0801) BP: (110-127)/(61-77) 111/61 (08/30 0801) SpO2:  [94 %-98 %] 95 % (08/30 0801)  Intake/Output from previous day: 08/29 0701 - 08/30 0700 In: 480 [P.O.:480] Out: 1820 [Urine:1820] Intake/Output this shift: No intake/output data recorded.  Recent Labs    07/23/18 0416 07/24/18 0035 07/24/18 0739 07/25/18 0404  HGB 6.8* 9.1* 8.6* 8.9*   Recent Labs    07/24/18 0739 07/25/18 0404  WBC 5.5 7.3  RBC 2.73* 2.84*  HCT 25.7* 26.8*  PLT 78* 77*   Recent Labs    07/24/18 0035 07/24/18 0739  NA 139 137  K 4.9 4.8  CL 108 110  CO2 23 20*  BUN 17 16  CREATININE 0.97 0.90  GLUCOSE 229* 134*  CALCIUM 9.2 8.8*   Recent Labs    07/24/18 0035  INR 1.40    Exam Patient very drowsy.  Arouses briefly during my visit with him.  Nurse tech helped roll patient for wound check.  Wounds look good.  No drainage or signs of infection.  Bilateral calves are nontender.  Trace bilateral anterior tib weakness.      Assessment/Plan: Patient is on a fair amount of pain medication.  Will D/C morphine and Oxy IR 5mg .  I advised patient that we need him to be more alert, up and mobile.  We will see how he does with PT today.   Michael KiefJames [name] 07/25/2018, 9:50 AM

## 2018-07-26 LAB — GLUCOSE, CAPILLARY
GLUCOSE-CAPILLARY: 112 mg/dL — AB (ref 70–99)
Glucose-Capillary: 153 mg/dL — ABNORMAL HIGH (ref 70–99)

## 2018-07-26 LAB — AMMONIA: Ammonia: 77 umol/L — ABNORMAL HIGH (ref 9–35)

## 2018-07-26 MED ORDER — OXYCODONE HCL 20 MG PO TABS: 20.0000 mg | ORAL_TABLET | ORAL | 0 refills | Status: DC | PRN

## 2018-07-26 MED ORDER — METHOCARBAMOL 500 MG PO TABS: 500.0000 mg | ORAL_TABLET | Freq: Four times a day (QID) | ORAL | 1 refills | Status: DC | PRN

## 2018-07-26 MED ORDER — OXYCODONE HCL ER 15 MG PO T12A: 20.0000 mg | EXTENDED_RELEASE_TABLET | Freq: Two times a day (BID) | ORAL | 0 refills | Status: DC

## 2018-07-26 MED ORDER — NALOXONE HCL 4 MG/0.1ML NA LIQD: NASAL | 0 refills | Status: DC

## 2018-07-26 MED ORDER — GABAPENTIN 300 MG PO CAPS: 300.0000 mg | ORAL_CAPSULE | Freq: Three times a day (TID) | ORAL | 3 refills | Status: DC

## 2018-07-26 MED ORDER — FERROUS GLUCONATE 324 (38 FE) MG PO TABS: 324.0000 mg | ORAL_TABLET | Freq: Two times a day (BID) | ORAL | 3 refills | Status: DC

## 2018-07-26 NOTE — Progress Notes (Signed)
Patient's wife states they have not received RW 3/1 yet. Requested Reggie w [facility] to bring to room today for DC. [facility] No other CM needs

## 2018-07-26 NOTE — Progress Notes (Signed)
     Subjective: 4 Days Post-Op Procedure(s): TRANSFORAMINAL LUMBAR INTERBODY FUSION LEFT  L4-5 AND RIGHT L5-S1 WITH PEDICLE SCREWS, RODS, CAGES, LOCAL AND ALLOGRAFT BONE GRAFT, VIVIGEN Awake alert and oriented x 4. Dressing is dry. Increase to 20 mg oxy IR for break through was effective in relieving pain. Vistaril not used. Not nauseated.  Will prescribe a Narcan Kit for use of narcotics in greater than 100 MME   Patient reports pain as moderate.    Objective:   VITALS:  Temp:  [98.2 F (36.8 C)-99.8 F (37.7 C)] 99.1 F (37.3 C) (08/31 0745) Pulse Rate:  [90-98] 98 (08/31 0745) Resp:  [15-20] 18 (08/31 0745) BP: (93-126)/(53-77) 112/77 (08/31 0745) SpO2:  [94 %-98 %] 97 % (08/31 0745)  Neurologically intact ABD soft Neurovascular intact Sensation intact distally Intact pulses distally Dorsiflexion/Plantar flexion intact Incision: dressing C/D/I and no drainage   LABS Recent Labs    07/24/18 0035 07/24/18 0739 07/25/18 0404  HGB 9.1* 8.6* 8.9*  WBC 7.1 5.5 7.3  PLT 77*  75* 78* 77*   Recent Labs    07/24/18 0035 07/24/18 0739  NA 139 137  K 4.9 4.8  CL 108 110  CO2 23 20*  BUN 17 16  CREATININE 0.97 0.90  GLUCOSE 229* 134*   Recent Labs    07/24/18 0035  INR 1.40     Assessment/Plan: 4 Days Post-Op Procedure(s): TRANSFORAMINAL LUMBAR INTERBODY FUSION LEFT  L4-5 AND RIGHT L5-S1 WITH PEDICLE SCREWS, RODS, CAGES, LOCAL AND ALLOGRAFT BONE GRAFT, VIVIGEN  Advance diet Up with therapy Discharge home with home health  Basil Dess 07/26/2018, 11:20 AMPatient ID: Michael [name], male   DOB: 05/06/1966, 52 y.o.   MRN: [id number]

## 2018-07-26 NOTE — Progress Notes (Signed)
Patient slept well through the night, as I checked numerous times. Snoring, however easy to arouse.

## 2018-07-26 NOTE — Progress Notes (Addendum)
Physical Therapy Treatment Patient Details Name: Michael [name] MRN: [id number] DOB: 01/26/1966 Today's Date: 07/26/2018    History of Present Illness Pt is a 52 y.o. M with significant PMH of depression, skin abscess, lumbar laminectomy/decompression now s/p transforaminal lumbar interbody fusion left L4-5 and right L5-S1    PT Comments    Patient seen for mobility progression s/p spinal surgery. Patient educated on car transfers and mobility expectations. Performed stair negotiation with patient today and educated patient and caregiver on need for guarding when performing stairs at home.   Spoke with patient and caregiver at length regarding DME recommendations as they inquired about use of platform RW.  This device would be contraindicated for this patient due to flexion precautions as device does require some thoracic flexion and additionally, platform RW is much more cumbersome and difficult to manuever.  Patient much more upright and steady with normal RW.  At this time, this therapist continues to feel current DME recommendations as indicated below are appropriate.   OF NOTE: Patient very lethargic this session suspect medication related.   Follow Up Recommendations  Home health PT;Supervision for mobility/OOB     Equipment Recommendations  Rolling walker with 5" wheels    Recommendations for Other Services       Precautions / Restrictions Precautions Precautions: Back;Fall Precaution Comments: verbally reviewed with patient Required Braces or Orthoses: Spinal Brace Spinal Brace: Applied in sitting position;Thoracolumbosacral orthotic Restrictions Weight Bearing Restrictions: No    Mobility  Bed Mobility Overal bed mobility: Modified Independent Bed Mobility: Sidelying to Sit   Sidelying to sit: Modified independent (Device/Increase time)       General bed mobility comments: use of bed rail  Transfers Overall transfer level: Needs assistance Equipment used:  Rolling walker (2 wheeled) Transfers: Sit to/from Stand Sit to Stand: Supervision         General transfer comment: Supervision for safety no physical assist  Ambulation/Gait Ambulation/Gait assistance: Min guard Gait Distance (Feet): 320 Feet Assistive device: Rolling walker (2 wheeled) Gait Pattern/deviations: Decreased stride length;Step-through pattern;Decreased step length - left;Decreased step length - right Gait velocity: decr Gait velocity interpretation: <1.8 ft/sec, indicate of risk for recurrent falls General Gait Details: Some modest instability, improved with cues for increased cadence   Stairs Stairs: Yes Stairs assistance: Min guard Stair Management: Forwards Number of Stairs: 2 General stair comments: educated on technique and sequencing   Wheelchair Mobility    Modified Rankin (Stroke Patients Only)       Balance Overall balance assessment: Needs assistance Sitting-balance support: No upper extremity supported;Feet supported Sitting balance-Leahy Scale: Good Sitting balance - Comments: Able to donn brace independently.    Standing balance support: During functional activity;Bilateral upper extremity supported Standing balance-Leahy Scale: Poor Standing balance comment: pt requires UE support for dynamic balance                            Cognition Arousal/Alertness: Lethargic Behavior During Therapy: Flat affect Overall Cognitive Status: Within Functional Limits for tasks assessed                                 General Comments: Slow responsiveness       Exercises      General Comments        Pertinent Vitals/Pain Pain Assessment: Faces Faces Pain Scale: Hurts little more Pain Location: back Pain Descriptors / Indicators: Sore Pain  Intervention(s): Monitored during session    Home Living                      Prior Function            PT Goals (current goals can now be found in the care plan  section) Acute Rehab PT Goals Patient Stated Goal: decrease pain PT Goal Formulation: With patient Time For Goal Achievement: 08/06/18 Potential to Achieve Goals: Good Progress towards PT goals: Progressing toward goals    Frequency    Min 5X/week      PT Plan Current plan remains appropriate    Co-evaluation              AM-PAC PT "6 Clicks" Daily Activity  Outcome Measure  Difficulty turning over in bed (including adjusting bedclothes, sheets and blankets)?: None Difficulty moving from lying on back to sitting on the side of the bed? : None Difficulty sitting down on and standing up from a chair with arms (e.g., wheelchair, bedside commode, etc,.)?: A Little Help needed moving to and from a bed to chair (including a wheelchair)?: A Little Help needed walking in hospital room?: A Little Help needed climbing 3-5 steps with a railing? : A Little 6 Click Score: 20    End of Session Equipment Utilized During Treatment: Back brace Activity Tolerance: Patient tolerated treatment well Patient left: with call bell/phone within reach;in chair Nurse Communication: Mobility status PT Visit Diagnosis: Other abnormalities of gait and mobility (R26.89);Difficulty in walking, not elsewhere classified (R26.2);Pain Pain - Right/Left: (both) Pain - part of body: Leg(back)     Time: 1029-1050 PT Time Calculation (min) (ACUTE ONLY): 21 min  Charges:  $Gait Training: 8-22 mins                     Charlotte Crumbevon Tiah Heckel, PT DPT  Board Certified Neurologic Specialist [phone]    Fabio AsaDevon J Florrie Ramires 07/26/2018, 10:54 AM

## 2018-07-26 NOTE — Progress Notes (Signed)
NURSING PROGRESS NOTE  Michael [name] [id number] Discharge Data: 07/26/2018 1:42 PM Attending Provider: Jessy Oto, MD Michael [name], Michael Li, MD     Michael [name] to be D/C'd Home per MD order.  Discussed with the patient the After Visit Summary and all questions fully answered. All IV's discontinued with no bleeding noted. All belongings returned to patient for patient to take home. Walker and 3n1 at bedside with mom.   Last Vital Signs:  Blood pressure 117/83, pulse (!) 101, temperature 98.9 F (37.2 C), temperature source Oral, resp. rate 18, height 5' 10.5" (1.791 m), weight 89.7 kg, SpO2 100 %.  Discharge Medication List Allergies as of 07/26/2018      Reactions   Cat Hair Extract Anaphylaxis   Iodine-131 Shortness Of Breath, Other (See Comments)   MRI contrast dye   Other Anaphylaxis   Penicillins Rash, Other (See Comments)   Has patient had a PCN reaction causing immediate rash, facial/tongue/throat swelling, SOB or lightheadedness with hypotension: yes Has patient had a PCN reaction causing severe rash involving mucus membranes or skin necrosis: no Has patient had a PCN reaction that required hospitalization: no Has patient had a PCN reaction occurring within the last 10 years: no If all of the above answers are "NO", then may proceed with Cephalosporin use.   Nickel Itching, Other (See Comments)   redness   Latex Rash      Medication List    STOP taking these medications   HYDROcodone-acetaminophen 10-325 MG tablet Commonly known as:  NORCO   oxycodone 5 MG capsule Commonly known as:  OXY-IR Replaced by:  oxyCODONE 15 mg 12 hr tablet   traMADol 50 MG tablet Commonly known as:  ULTRAM     TAKE these medications   CIALIS 5 MG tablet Generic drug:  tadalafil Take 5 mg by mouth daily as needed for erectile dysfunction.   dapsone 100 MG tablet Take 100 mg by mouth daily.   EPINEPHrine 0.3 mg/0.3 mL Soaj injection Commonly known as:  EPI-PEN Inject 0.3 mg into  the muscle once.   ferrous gluconate 324 MG tablet Commonly known as:  FERGON Take 1 tablet (324 mg total) by mouth 2 (two) times daily with a meal.   ferrous sulfate 325 (65 FE) MG tablet Take 325 mg by mouth daily before supper.   gabapentin 300 MG capsule Commonly known as:  NEURONTIN Take 1 capsule (300 mg total) by mouth 3 (three) times daily.   HUMIRA 40 MG/0.8ML Pskt Generic drug:  Adalimumab Inject 40 mg into the skin every Thursday.   hydrochlorothiazide 25 MG tablet Commonly known as:  HYDRODIURIL Take 25 mg by mouth daily.   lactulose 10 GM/15ML solution Commonly known as:  CHRONULAC Take 15 mLs (10 g total) 3 (three) times daily by mouth. What changed:    when to take this  reasons to take this   lisinopril 40 MG tablet Commonly known as:  PRINIVIL,ZESTRIL Take 40 mg by mouth daily.   Melatonin 1 MG Tabs Take 2 mg by mouth at bedtime as needed (for sleep).   methocarbamol 500 MG tablet Commonly known as:  ROBAXIN Take 1 tablet (500 mg total) by mouth every 8 (eight) hours as needed for muscle spasms. What changed:  Another medication with the same name was added. Make sure you understand how and when to take each.   methocarbamol 500 MG tablet Commonly known as:  ROBAXIN Take 1 tablet (500 mg total) by mouth every 6 (six) hours as  needed for muscle spasms. What changed:  You were already taking a medication with the same name, and this prescription was added. Make sure you understand how and when to take each.   montelukast 10 MG tablet Commonly known as:  SINGULAIR Take 10 mg by mouth at bedtime.   naloxone 4 MG/0.1ML Liqd nasal spray kit Commonly known as:  NARCAN Take as directed for respiratory depression or narcotic overdose.   omeprazole 20 MG capsule Commonly known as:  PRILOSEC Take 1 capsule (20 mg total) by mouth 2 (two) times daily.   ondansetron 8 MG tablet Commonly known as:  ZOFRAN Take 8 mg by mouth every 8 (eight) hours as  needed for nausea or vomiting.   oxyCODONE 15 mg 12 hr tablet Commonly known as:  OXYCONTIN Take 1 tablet (15 mg total) by mouth every 12 (twelve) hours. Replaces:  oxycodone 5 MG capsule   Oxycodone HCl 20 MG Tabs Take 1 tablet (20 mg total) by mouth every 4 (four) hours as needed for severe pain ((score 7 to 10)).   potassium chloride 10 MEQ CR capsule Commonly known as:  MICRO-K Take 10 mEq by mouth daily.   PROAIR HFA 108 (90 Base) MCG/ACT inhaler Generic drug:  albuterol Inhale 2 puffs into the lungs every 4 (four) hours as needed for wheezing or shortness of breath.   propranolol 10 MG tablet Commonly known as:  INDERAL Take 10 mg by mouth 2 (two) times daily.   SSD 1 % cream Generic drug:  silver sulfADIAZINE Apply 1 application topically daily as needed (shoulder).   traZODone 100 MG tablet Commonly known as:  DESYREL Take 200 mg by mouth at bedtime.   XIIDRA 5 % Soln Generic drug:  Lifitegrast Place 1 drop into both eyes 2 (two) times daily.   zolpidem 10 MG tablet Commonly known as:  AMBIEN Take 10 mg by mouth at bedtime.            Durable Medical Equipment  (From admission, onward)         Start     Ordered   07/22/18 2022  DME Walker rolling  Once    Question:  Patient needs a walker to treat with the following condition  Answer:  Status post lumbar spinal fusion   07/22/18 2021   07/22/18 2022  DME 3 n 1  Once     07/22/18 2021

## 2018-08-01 ENCOUNTER — Other Ambulatory Visit (INDEPENDENT_AMBULATORY_CARE_PROVIDER_SITE_OTHER): Payer: Self-pay | Admitting: Specialist

## 2018-08-01 MED ORDER — OXYCODONE HCL 20 MG PO TABS: 20.0000 mg | ORAL_TABLET | ORAL | 0 refills | Status: DC | PRN

## 2018-08-01 NOTE — Telephone Encounter (Signed)
Sent request to James 

## 2018-08-01 NOTE — Telephone Encounter (Signed)
I called and advised pt his rx is ready for pick up at the front desk 

## 2018-08-01 NOTE — Telephone Encounter (Signed)
Ok

## 2018-08-01 NOTE — Telephone Encounter (Signed)
Patient called needing Rx refilled (Oxycodone) The number to contact patient is [phone]

## 2018-08-04 ENCOUNTER — Telehealth (INDEPENDENT_AMBULATORY_CARE_PROVIDER_SITE_OTHER): Payer: Self-pay | Admitting: Specialist

## 2018-08-04 NOTE — Telephone Encounter (Signed)
Sharol Harness  [facility]  [phone]    Verbal orders   Twice a week for three weeks PT orders   Strengthening, balance and safety

## 2018-08-04 NOTE — Telephone Encounter (Signed)
I called and gave verbal ok for orders 

## 2018-08-06 ENCOUNTER — Telehealth (INDEPENDENT_AMBULATORY_CARE_PROVIDER_SITE_OTHER): Payer: Self-pay | Admitting: Specialist

## 2018-08-06 NOTE — Telephone Encounter (Signed)
I called and advised that Dr. Heriberto Antigua out today and that he has an appt tomorrow and that we will see if he needs them at that appt. He understands

## 2018-08-06 NOTE — Telephone Encounter (Signed)
Patient called needing a Rx for bandages for his back. Patient said the bandages are very expensive. The bandages are called Opsite post op visible 8 by 4 inches.  They only sell a box of 20 bandages. The number to contact patient is  [phone]   Patient said the Rx should be sent to [facility] [phone] The fax# is [phone]

## 2018-08-07 ENCOUNTER — Ambulatory Visit (INDEPENDENT_AMBULATORY_CARE_PROVIDER_SITE_OTHER): Payer: BLUE CROSS/BLUE SHIELD

## 2018-08-07 ENCOUNTER — Ambulatory Visit (INDEPENDENT_AMBULATORY_CARE_PROVIDER_SITE_OTHER): Payer: BLUE CROSS/BLUE SHIELD | Admitting: Specialist

## 2018-08-07 ENCOUNTER — Encounter (INDEPENDENT_AMBULATORY_CARE_PROVIDER_SITE_OTHER): Payer: Self-pay | Admitting: Specialist

## 2018-08-07 VITALS — BP 109/68 | HR 99 | Ht 70.5 in | Wt 197.0 lb

## 2018-08-07 DIAGNOSIS — Z981 Arthrodesis status: Secondary | ICD-10-CM

## 2018-08-07 MED ORDER — OXYCODONE HCL ER 15 MG PO T12A: 20.0000 mg | EXTENDED_RELEASE_TABLET | Freq: Two times a day (BID) | ORAL | 0 refills | Status: DC

## 2018-08-07 MED ORDER — OXYCODONE HCL 20 MG PO TABS: 20.0000 mg | ORAL_TABLET | ORAL | 0 refills | Status: DC | PRN

## 2018-08-07 NOTE — Discharge Summary (Signed)
Patient ID: Michael [name] MRN: [id number] DOB/AGE: 06-15-1966 52 y.o.  Admit date: 07/22/2018 Discharge date: 08/07/2018  Admission Diagnoses:  Principal Problem:   Spinal stenosis of lumbar region Active Problems:   Acute blood loss anemia   Anemia associated with acute blood loss   Degenerative disc disease, lumbar   Status post lumbar spinal fusion   Anemia due to acute blood loss   Thrombocytopenia (HCC)   Hyperglycemia   Discharge Diagnoses:  Principal Problem:   Spinal stenosis of lumbar region Active Problems:   Acute blood loss anemia   Anemia associated with acute blood loss   Degenerative disc disease, lumbar   Status post lumbar spinal fusion   Anemia due to acute blood loss   Thrombocytopenia (HCC)   Hyperglycemia  status post Procedure(s): TRANSFORAMINAL LUMBAR INTERBODY FUSION LEFT  L4-5 AND RIGHT L5-S1 WITH PEDICLE SCREWS, RODS, CAGES, LOCAL AND ALLOGRAFT BONE GRAFT, VIVIGEN  Past Medical History:  Diagnosis Date  . Acne conglobata   . Allergy   . Anemia   . Anxiety   . Arthritis   . Asthma   . Complication of anesthesia    woke up during surgery for the past 3 surgeries  . Depression   . Gastritis   . GERD (gastroesophageal reflux disease)   . Headache    hx. migraines  none in a long time  . History of blood transfusion   . Hypertension   . Insomnia   . MRSA (methicillin resistant Staphylococcus aureus) infection    left hand  . Skin abscess    Recurrent  . Skin disease    Hidradentitis Suppurativa  . Sleep apnea    uses CPAP  on and off    Surgeries: Procedure(s): TRANSFORAMINAL LUMBAR INTERBODY FUSION LEFT  L4-5 AND RIGHT L5-S1 WITH PEDICLE SCREWS, RODS, CAGES, LOCAL AND ALLOGRAFT BONE GRAFT, VIVIGEN on 07/22/2018   Consultants: Treatment Team:  Marcheta Grammes, MD  Discharged Condition: Improved  Hospital Course: Michael [name] is an 52 y.o. male who was admitted 07/22/2018 for operative treatment of Spinal stenosis of lumbar  region. Patient failed conservative treatments (please see the history and physical for the specifics) and had severe unremitting pain that affects sleep, daily activities and work/hobbies. After pre-op clearance, the patient was taken to the operating room on 07/22/2018 and underwent  Procedure(s): TRANSFORAMINAL LUMBAR INTERBODY FUSION LEFT  L4-5 AND RIGHT L5-S1 WITH PEDICLE SCREWS, RODS, CAGES, LOCAL AND ALLOGRAFT BONE GRAFT, VIVIGEN.    Patient was given perioperative antibiotics:  Anti-infectives (From admission, onward)   Start     Dose/Rate Route Frequency Ordered Stop   07/22/18 2200  vancomycin (VANCOCIN) IVPB 1000 mg/200 mL premix     1,000 mg 200 mL/hr over 60 Minutes Intravenous Every 12 hours 07/22/18 2054 07/23/18 1711   07/22/18 2030  dapsone tablet 100 mg  Status:  Discontinued     100 mg Oral Daily 07/22/18 2021 07/26/18 1719   07/22/18 2030  ceFAZolin (ANCEF) IVPB 2g/100 mL premix  Status:  Discontinued     2 g 200 mL/hr over 30 Minutes Intravenous Every 8 hours 07/22/18 2021 07/22/18 2109   07/22/18 1330  clindamycin (CLEOCIN) IVPB 600 mg  Status:  Discontinued     600 mg 100 mL/hr over 30 Minutes Intravenous To Surgery 07/22/18 1327 07/22/18 2021   07/22/18 1028  vancomycin (VANCOCIN) powder  Status:  Discontinued       As needed 07/22/18 1028 07/22/18 1613   07/22/18 0615  vancomycin (VANCOCIN)  IVPB 1000 mg/200 mL premix     1,000 mg 200 mL/hr over 60 Minutes Intravenous On call to O.R. 07/22/18 0602 07/22/18 0739   07/22/18 0608  clindamycin (CLEOCIN) 600 MG/50ML IVPB    Note to Pharmacy:  Granville Lewis, Lindsi   : cabinet override      07/22/18 0608 07/22/18 0845   07/22/18 0602  clindamycin (CLEOCIN) IVPB 600 mg     600 mg 100 mL/hr over 30 Minutes Intravenous 60 min pre-op 07/22/18 0602 07/22/18 1445       Patient was given sequential compression devices and early ambulation to prevent DVT.   Patient benefited maximally from hospital stay and there were no  complications. At the time of discharge, the patient was urinating/moving their bowels without difficulty, tolerating a regular diet, pain is controlled with oral pain medications and they have been cleared by PT/OT.   Recent vital signs: No data found.   Recent laboratory studies: No results for input(s): WBC, HGB, HCT, PLT, NA, K, CL, CO2, BUN, CREATININE, GLUCOSE, INR, CALCIUM in the last 72 hours.  Invalid input(s): PT, 2   Discharge Medications:   Allergies as of 07/26/2018      Reactions   Cat Hair Extract Anaphylaxis   Iodine-131 Shortness Of Breath, Other (See Comments)   MRI contrast dye   Other Anaphylaxis   Penicillins Rash, Other (See Comments)   Has patient had a PCN reaction causing immediate rash, facial/tongue/throat swelling, SOB or lightheadedness with hypotension: yes Has patient had a PCN reaction causing severe rash involving mucus membranes or skin necrosis: no Has patient had a PCN reaction that required hospitalization: no Has patient had a PCN reaction occurring within the last 10 years: no If all of the above answers are "NO", then may proceed with Cephalosporin use.   Nickel Itching, Other (See Comments)   redness   Latex Rash      Medication List    STOP taking these medications   HYDROcodone-acetaminophen 10-325 MG tablet Commonly known as:  NORCO   oxycodone 5 MG capsule Commonly known as:  OXY-IR Replaced by:  oxyCODONE 15 mg 12 hr tablet   traMADol 50 MG tablet Commonly known as:  ULTRAM     TAKE these medications   CIALIS 5 MG tablet Generic drug:  tadalafil Take 5 mg by mouth daily as needed for erectile dysfunction.   dapsone 100 MG tablet Take 100 mg by mouth daily.   EPINEPHrine 0.3 mg/0.3 mL Soaj injection Commonly known as:  EPI-PEN Inject 0.3 mg into the muscle once.   ferrous gluconate 324 MG tablet Commonly known as:  FERGON Take 1 tablet (324 mg total) by mouth 2 (two) times daily with a meal.   ferrous sulfate 325 (65  FE) MG tablet Take 325 mg by mouth daily before supper.   gabapentin 300 MG capsule Commonly known as:  NEURONTIN Take 1 capsule (300 mg total) by mouth 3 (three) times daily.   HUMIRA 40 MG/0.8ML Pskt Generic drug:  Adalimumab Inject 40 mg into the skin every Thursday.   hydrochlorothiazide 25 MG tablet Commonly known as:  HYDRODIURIL Take 25 mg by mouth daily.   lactulose 10 GM/15ML solution Commonly known as:  CHRONULAC Take 15 mLs (10 g total) 3 (three) times daily by mouth. What changed:    when to take this  reasons to take this   lisinopril 40 MG tablet Commonly known as:  PRINIVIL,ZESTRIL Take 40 mg by mouth daily.   Melatonin 1  MG Tabs Take 2 mg by mouth at bedtime as needed (for sleep).   methocarbamol 500 MG tablet Commonly known as:  ROBAXIN Take 1 tablet (500 mg total) by mouth every 8 (eight) hours as needed for muscle spasms. What changed:  Another medication with the same name was added. Make sure you understand how and when to take each.   methocarbamol 500 MG tablet Commonly known as:  ROBAXIN Take 1 tablet (500 mg total) by mouth every 6 (six) hours as needed for muscle spasms. What changed:  You were already taking a medication with the same name, and this prescription was added. Make sure you understand how and when to take each.   montelukast 10 MG tablet Commonly known as:  SINGULAIR Take 10 mg by mouth at bedtime.   naloxone 4 MG/0.1ML Liqd nasal spray kit Commonly known as:  NARCAN Take as directed for respiratory depression or narcotic overdose.   omeprazole 20 MG capsule Commonly known as:  PRILOSEC Take 1 capsule (20 mg total) by mouth 2 (two) times daily.   ondansetron 8 MG tablet Commonly known as:  ZOFRAN Take 8 mg by mouth every 8 (eight) hours as needed for nausea or vomiting.   oxyCODONE 15 mg 12 hr tablet Commonly known as:  OXYCONTIN Take 1 tablet (15 mg total) by mouth every 12 (twelve) hours. Replaces:  oxycodone 5 MG  capsule   potassium chloride 10 MEQ CR capsule Commonly known as:  MICRO-K Take 10 mEq by mouth daily.   PROAIR HFA 108 (90 Base) MCG/ACT inhaler Generic drug:  albuterol Inhale 2 puffs into the lungs every 4 (four) hours as needed for wheezing or shortness of breath.   propranolol 10 MG tablet Commonly known as:  INDERAL Take 10 mg by mouth 2 (two) times daily.   SSD 1 % cream Generic drug:  silver sulfADIAZINE Apply 1 application topically daily as needed (shoulder).   traZODone 100 MG tablet Commonly known as:  DESYREL Take 200 mg by mouth at bedtime.   XIIDRA 5 % Soln Generic drug:  Lifitegrast Place 1 drop into both eyes 2 (two) times daily.   zolpidem 10 MG tablet Commonly known as:  AMBIEN Take 10 mg by mouth at bedtime.       Diagnostic Studies: Dg Lumbar Spine Complete  Result Date: 07/22/2018 CLINICAL DATA:  Lumbar fusion EXAM: DG C-ARM 61-120 MIN; LUMBAR SPINE - COMPLETE 4+ VIEW COMPARISON:  06/20/2018 FLUOROSCOPY TIME:  Radiation Exposure Index (as provided by the fluoroscopic device): Not available If the device does not provide the exposure index: Fluoroscopy Time:  1 minutes 36 seconds Number of Acquired Images:  4 FINDINGS: Pedicle screws are noted at L4, L5 and S1 with posterior fixation. Interbody fusion is noted at both levels as well. No soft tissue abnormality is seen. IMPRESSION: Status post lumbar fusion. Electronically Signed   By: Inez Catalina M.D.   On: 07/22/2018 15:40   Dg C-arm 1-60 Min  Result Date: 07/22/2018 CLINICAL DATA:  Lumbar fusion EXAM: DG C-ARM 61-120 MIN; LUMBAR SPINE - COMPLETE 4+ VIEW COMPARISON:  06/20/2018 FLUOROSCOPY TIME:  Radiation Exposure Index (as provided by the fluoroscopic device): Not available If the device does not provide the exposure index: Fluoroscopy Time:  1 minutes 36 seconds Number of Acquired Images:  4 FINDINGS: Pedicle screws are noted at L4, L5 and S1 with posterior fixation. Interbody fusion is noted at both  levels as well. No soft tissue abnormality is seen. IMPRESSION: Status post  lumbar fusion. Electronically Signed   By: Inez Catalina M.D.   On: 07/22/2018 15:40   Dg C-arm 1-60 Min  Result Date: 07/22/2018 CLINICAL DATA:  Lumbar fusion EXAM: DG C-ARM 61-120 MIN; LUMBAR SPINE - COMPLETE 4+ VIEW COMPARISON:  06/20/2018 FLUOROSCOPY TIME:  Radiation Exposure Index (as provided by the fluoroscopic device): Not available If the device does not provide the exposure index: Fluoroscopy Time:  1 minutes 36 seconds Number of Acquired Images:  4 FINDINGS: Pedicle screws are noted at L4, L5 and S1 with posterior fixation. Interbody fusion is noted at both levels as well. No soft tissue abnormality is seen. IMPRESSION: Status post lumbar fusion. Electronically Signed   By: Inez Catalina M.D.   On: 07/22/2018 15:40   Dg C-arm 1-60 Min  Result Date: 07/22/2018 CLINICAL DATA:  Lumbar fusion EXAM: DG C-ARM 61-120 MIN; LUMBAR SPINE - COMPLETE 4+ VIEW COMPARISON:  06/20/2018 FLUOROSCOPY TIME:  Radiation Exposure Index (as provided by the fluoroscopic device): Not available If the device does not provide the exposure index: Fluoroscopy Time:  1 minutes 36 seconds Number of Acquired Images:  4 FINDINGS: Pedicle screws are noted at L4, L5 and S1 with posterior fixation. Interbody fusion is noted at both levels as well. No soft tissue abnormality is seen. IMPRESSION: Status post lumbar fusion. Electronically Signed   By: Inez Catalina M.D.   On: 07/22/2018 15:40    Discharge Instructions    Call MD / Call 911   Complete by:  As directed    If you experience chest pain or shortness of breath, CALL 911 and be transported to the hospital emergency room.  If you develope a fever above 101 F, pus (white drainage) or increased drainage or redness at the wound, or calf pain, call your surgeon's office.   Constipation Prevention   Complete by:  As directed    Drink plenty of fluids.  Prune juice may be helpful.  You may use a stool  softener, such as Colace (over the counter) 100 mg twice a day.  Use MiraLax (over the counter) for constipation as needed.   Diet - low sodium heart healthy   Complete by:  As directed    Discharge instructions   Complete by:  As directed    Call if there is increasing drainage, fever greater than 101.5, severe head aches, and worsening nausea or light sensitivity. If shortness of breath, bloody cough or chest tightness or pain go to an emergency room. No lifting greater than 10 lbs. Avoid bending, stooping and twisting. Use brace when sitting and out of bed even to go to bathroom. Walk in house for first 2 weeks then may start to get out slowly increasing distances up to one mile by 4-6 weeks post op. After 5 days may shower and change dressing following bathing with shower.When bathing remove the brace shower and replace brace before getting out of the shower. If drainage, keep dry dressing and do not bathe the incision, use an moisture impervious dressing. Please call and return for scheduled follow up appointment 2 weeks from the time of surgery.   Driving restrictions   Complete by:  As directed    No driving for 2 weeks   Increase activity slowly as tolerated   Complete by:  As directed    Lifting restrictions   Complete by:  As directed    No lifting for 12 weeks      Follow-up Information    Jessy Oto,  MD In 2 weeks.   Specialty:  Orthopedic Surgery Why:  For wound re-check Contact information: [place] [address] [phone]        [facility] Follow up.   Specialty:  Home Health Services Why:  Home Health Physical Therapy- agency will call to arrange appointment Contact information: [place] [address] [place] Follow up.   Why:  Rolling Walker and 3n1 bedside commode- will be deliver to room prior to discharge Contact information: [address] [place] [address] [phone]           Discharge Plan:  discharge to home  Disposition:     Signed: Benjiman Core  08/07/2018, 11:34 AM

## 2018-08-07 NOTE — Patient Instructions (Signed)
Plan: Avoid frequent bending and stooping  No lifting greater than 10 lbs. May use ice or moist heat for pain. Weight loss is of benefit. Best medication for lumbar disc disease is arthritis medications like motrin.Marland Kitchen. Exercise is important to improve your indurance and does allow people to function better inspite of back pain. Walking program, to increase his endurance. Oxycontin 15 mg po every 12 hours. Oxycodone 20 mg every 4 hours prn pain.
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
## 2018-08-13 ENCOUNTER — Encounter (INDEPENDENT_AMBULATORY_CARE_PROVIDER_SITE_OTHER): Payer: Self-pay | Admitting: Specialist

## 2018-08-14 ENCOUNTER — Other Ambulatory Visit (INDEPENDENT_AMBULATORY_CARE_PROVIDER_SITE_OTHER): Payer: Self-pay | Admitting: Specialist

## 2018-08-14 MED ORDER — METHOCARBAMOL 500 MG PO TABS: 500.0000 mg | ORAL_TABLET | Freq: Four times a day (QID) | ORAL | 1 refills | Status: DC | PRN

## 2018-08-14 NOTE — Telephone Encounter (Signed)
Patient called needing Rx refilled (Methocarbamol 500mg )  Patient uses [facility] [address]. Patient said he has 1 tab left.  The number to contact patient is [phone]

## 2018-08-14 NOTE — Telephone Encounter (Signed)
I called approved rx to [facility]

## 2018-08-14 NOTE — Telephone Encounter (Signed)
Sent request to James 

## 2018-08-14 NOTE — Progress Notes (Signed)
Spoke with Michael [name] and he is having problems with obtaining an appointment with Dr. Jacqlyn Larsenavid Derm. To  Assess a area of skin subcutaneous abscess near his lumbar surgical site. I told him I will see him on Monday at 1PM to look at this area and lance it if necessary. He has requested a refill on his methocarbamol and this was sent to his pharmacy.  jen

## 2018-08-18 ENCOUNTER — Encounter (INDEPENDENT_AMBULATORY_CARE_PROVIDER_SITE_OTHER): Payer: Self-pay | Admitting: Specialist

## 2018-08-18 ENCOUNTER — Other Ambulatory Visit (INDEPENDENT_AMBULATORY_CARE_PROVIDER_SITE_OTHER): Payer: Self-pay | Admitting: Specialist

## 2018-08-18 ENCOUNTER — Ambulatory Visit (INDEPENDENT_AMBULATORY_CARE_PROVIDER_SITE_OTHER): Payer: BLUE CROSS/BLUE SHIELD | Admitting: Specialist

## 2018-08-18 ENCOUNTER — Telehealth (INDEPENDENT_AMBULATORY_CARE_PROVIDER_SITE_OTHER): Payer: Self-pay | Admitting: Radiology

## 2018-08-18 VITALS — BP 108/69 | HR 84 | Ht 70.5 in | Wt 197.0 lb

## 2018-08-18 DIAGNOSIS — M4326 Fusion of spine, lumbar region: Secondary | ICD-10-CM

## 2018-08-18 DIAGNOSIS — L989 Disorder of the skin and subcutaneous tissue, unspecified: Secondary | ICD-10-CM

## 2018-08-18 MED ORDER — OXYCODONE HCL 20 MG PO TABS: 20.0000 mg | ORAL_TABLET | ORAL | 0 refills | Status: DC | PRN

## 2018-08-18 MED ORDER — CLINDAMYCIN HCL 300 MG PO CAPS: ORAL_CAPSULE | ORAL | 0 refills | Status: DC

## 2018-08-18 NOTE — Addendum Note (Signed)
Addended by: Vira BrownsNITKA, Mylea Roarty on: 08/18/2018 02:22 PM   Modules accepted: Orders

## 2018-08-18 NOTE — Telephone Encounter (Signed)
I called and gave verbal ok for orders 

## 2018-08-18 NOTE — Patient Instructions (Signed)
Plan: Avoid frequent bending and stooping  No lifting greater than 10 lbs. May use ice or moist heat for pain. Weight loss is of benefit. Best medication for lumbar disc disease is arthritis medications like motrin, celebrex and naprosyn. Exercise is important to improve your indurance and does allow people to function better inspite of back pain. Try to decrease norcotic meds. We are stopping the ER form of oxycodone. Need to work on decreasing the  IR form 20 mg to taking every 4 then 5 then 6 hours as you are getting further from the time of surgery.

## 2018-08-18 NOTE — Telephone Encounter (Signed)
Rolm Galarik called requesting extension of PT orders: Requests 2x week x 3 weeks.  Please call Rolm Galarik at [phone] to advise.
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
## 2018-08-20 ENCOUNTER — Telehealth (INDEPENDENT_AMBULATORY_CARE_PROVIDER_SITE_OTHER): Payer: Self-pay | Admitting: Radiology

## 2018-08-20 NOTE — Telephone Encounter (Signed)
Michael [name] called stated that [name] would not just cover the tape that he requested, that we need to add some kind of bandage to it to get them to cover this.    [name] also called me states that he just needs something like an ABD pad or some kind of nonstick pad to use on draining area.  Please advise  Fax order to [phone], Phone [phone]

## 2018-08-22 ENCOUNTER — Encounter (INDEPENDENT_AMBULATORY_CARE_PROVIDER_SITE_OTHER): Payer: Self-pay | Admitting: Specialist

## 2018-08-22 ENCOUNTER — Ambulatory Visit (INDEPENDENT_AMBULATORY_CARE_PROVIDER_SITE_OTHER): Payer: BLUE CROSS/BLUE SHIELD | Admitting: Specialist

## 2018-08-22 VITALS — BP 133/75 | HR 81 | Ht 70.5 in | Wt 197.0 lb

## 2018-08-22 DIAGNOSIS — Z981 Arthrodesis status: Secondary | ICD-10-CM

## 2018-08-22 DIAGNOSIS — L732 Hidradenitis suppurativa: Secondary | ICD-10-CM

## 2018-08-22 MED ORDER — MUPIROCIN 2 % EX OINT: TOPICAL_OINTMENT | CUTANEOUS | 0 refills | Status: DC

## 2018-08-22 NOTE — Patient Instructions (Signed)
  Plan: Avoid frequent bending and stooping  No lifting greater than 10 lbs. May use ice or moist heat for pain. Weight loss is of benefit. Best medication for lumbar disc disease is arthritis medications like motrin, celebrex and naprosyn. Exercise is important to improve your indurance and does allow people to function better inspite of back pain. 
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
## 2018-08-26 ENCOUNTER — Telehealth (INDEPENDENT_AMBULATORY_CARE_PROVIDER_SITE_OTHER): Payer: Self-pay | Admitting: Specialist

## 2018-08-26 ENCOUNTER — Other Ambulatory Visit (INDEPENDENT_AMBULATORY_CARE_PROVIDER_SITE_OTHER): Payer: Self-pay | Admitting: Specialist

## 2018-08-26 MED ORDER — METHOCARBAMOL 500 MG PO TABS: 500.0000 mg | ORAL_TABLET | Freq: Four times a day (QID) | ORAL | 1 refills | Status: DC | PRN

## 2018-08-26 NOTE — Telephone Encounter (Signed)
This patient has chronic draining hiadenitis, basically boils that are due to a chronic skin condition, his  Wounds currently are 3 in number, we drained one and are not draining the others or caring for the others and the patient has asked [place] to renew his dressing. jen I do not plan to measure or provide measurements concerning the wounds I am not caring for. jen

## 2018-08-26 NOTE — Telephone Encounter (Signed)
[facility]-- I called and advised pt is was called in

## 2018-08-26 NOTE — Telephone Encounter (Signed)
Lurena Joiner from [facility] called needing wound assessment sent,measurement,drainage amount,if surgical wound or debrided and if full or partial thickness wound. The number to contact Lurena Joiner is  [phone]

## 2018-08-26 NOTE — Telephone Encounter (Signed)
Rx refill Robaxin [facility] [place]

## 2018-08-26 NOTE — Telephone Encounter (Signed)
Sent request to dr. nitka  

## 2018-08-26 NOTE — Telephone Encounter (Signed)
Lurena Joiner from [facility] called needing wound assessment sent,measurement,drainage amount,if surgical wound or debrided and if full or partial thickness wound.----Please advise so I can call her back, Thank you

## 2018-08-27 ENCOUNTER — Other Ambulatory Visit: Payer: Self-pay

## 2018-08-27 ENCOUNTER — Ambulatory Visit: Payer: BLUE CROSS/BLUE SHIELD | Attending: Specialist | Admitting: Physical Therapy

## 2018-08-27 DIAGNOSIS — G8929 Other chronic pain: Secondary | ICD-10-CM | POA: Diagnosis present

## 2018-08-27 DIAGNOSIS — M5441 Lumbago with sciatica, right side: Secondary | ICD-10-CM | POA: Insufficient documentation

## 2018-08-27 DIAGNOSIS — R262 Difficulty in walking, not elsewhere classified: Secondary | ICD-10-CM | POA: Insufficient documentation

## 2018-08-27 DIAGNOSIS — M6281 Muscle weakness (generalized): Secondary | ICD-10-CM | POA: Insufficient documentation

## 2018-08-27 NOTE — Telephone Encounter (Signed)
I called and advised Pricilla Holm at [facility] of Dr. Barbaraann Faster message below. Per Pricilla Holm they will resubmit to the insurance and let the patient know if they cover it or not and if not they will give him a quote on the price.

## 2018-08-28 ENCOUNTER — Encounter (INDEPENDENT_AMBULATORY_CARE_PROVIDER_SITE_OTHER): Payer: Self-pay | Admitting: Specialist

## 2018-08-28 ENCOUNTER — Ambulatory Visit (INDEPENDENT_AMBULATORY_CARE_PROVIDER_SITE_OTHER): Payer: BLUE CROSS/BLUE SHIELD | Admitting: Specialist

## 2018-08-28 NOTE — Patient Instructions (Signed)
Access Code: [email or web address]  URL: [email or web address]  Date: 08/28/2018  Prepared by: Dorie Rank   Exercises  Supine Transversus Abdominis Bracing - Hands on Stomach - 10 reps - 1 sets - 5 hold - 3x daily - 7x weekly  Beginner Clam - 10 reps - 1 sets - 3x daily - 7x weekly
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
## 2018-09-01 ENCOUNTER — Other Ambulatory Visit (INDEPENDENT_AMBULATORY_CARE_PROVIDER_SITE_OTHER): Payer: Self-pay | Admitting: Specialist

## 2018-09-01 ENCOUNTER — Encounter: Payer: BLUE CROSS/BLUE SHIELD | Admitting: Physical Therapy

## 2018-09-01 MED ORDER — OXYCODONE HCL 20 MG PO TABS: 20.0000 mg | ORAL_TABLET | ORAL | 0 refills | Status: DC | PRN

## 2018-09-01 NOTE — Telephone Encounter (Signed)
Sent request to Dr. Nitka 

## 2018-09-01 NOTE — Telephone Encounter (Signed)
Patient called needing Rx refilled (Oxycodone) Patient advised his mother will pick up Rx when it's ready. The number to contact patient is [phone]

## 2018-09-01 NOTE — Telephone Encounter (Signed)
rx was given to Patients mother Jan Fireman

## 2018-09-03 ENCOUNTER — Ambulatory Visit: Payer: BLUE CROSS/BLUE SHIELD | Admitting: Physical Therapy

## 2018-09-04 ENCOUNTER — Encounter

## 2018-09-05 ENCOUNTER — Ambulatory Visit: Payer: BLUE CROSS/BLUE SHIELD | Admitting: Physical Therapy

## 2018-09-05 DIAGNOSIS — M5441 Lumbago with sciatica, right side: Principal | ICD-10-CM

## 2018-09-05 DIAGNOSIS — G8929 Other chronic pain: Secondary | ICD-10-CM

## 2018-09-05 DIAGNOSIS — M6281 Muscle weakness (generalized): Secondary | ICD-10-CM

## 2018-09-05 NOTE — Patient Instructions (Signed)
Leg Extension (Hamstring)   Sit toward front edge of chair, with leg out straight, heel on floor, toes pointing toward body. Keeping back straight, bend forward at hip, until a stretch is felt. Hold 30-60 seconds. Repeat _3__ times. Repeat with other leg. Do __2-3_ sessions per day.  Or actively contract/relax your quads for a dynamic stretch.    ROM: Plantar / Dorsiflexion   With left leg relaxed, gently flex and extend ankle. Move through full range of motion. Avoid pain. Repeat __20__ times per set. Do ____ sets per session. Do __3-4__ sessions per day.   Solon Palm, PT 09/05/18 11:46 AM [facility] [address], [address] [place], [place] [address] Phone # [phone] Fax [phone]
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
## 2018-09-08 ENCOUNTER — Other Ambulatory Visit (INDEPENDENT_AMBULATORY_CARE_PROVIDER_SITE_OTHER): Payer: Self-pay | Admitting: Specialist

## 2018-09-08 MED ORDER — METHOCARBAMOL 500 MG PO TABS: 500.0000 mg | ORAL_TABLET | Freq: Four times a day (QID) | ORAL | 1 refills | Status: DC | PRN

## 2018-09-08 NOTE — Telephone Encounter (Signed)
Medication refill  Robaxin   [facility]

## 2018-09-08 NOTE — Telephone Encounter (Signed)
Sent request to Dr. Nitka 

## 2018-09-09 NOTE — Telephone Encounter (Signed)
[facility] 

## 2018-09-10 ENCOUNTER — Ambulatory Visit: Payer: BLUE CROSS/BLUE SHIELD | Admitting: Physical Therapy

## 2018-09-10 ENCOUNTER — Encounter: Payer: Self-pay | Admitting: Physical Therapy

## 2018-09-10 DIAGNOSIS — G8929 Other chronic pain: Secondary | ICD-10-CM

## 2018-09-10 DIAGNOSIS — M5441 Lumbago with sciatica, right side: Principal | ICD-10-CM

## 2018-09-10 DIAGNOSIS — R262 Difficulty in walking, not elsewhere classified: Secondary | ICD-10-CM

## 2018-09-10 DIAGNOSIS — M6281 Muscle weakness (generalized): Secondary | ICD-10-CM
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
## 2018-09-11 ENCOUNTER — Other Ambulatory Visit (INDEPENDENT_AMBULATORY_CARE_PROVIDER_SITE_OTHER): Payer: Self-pay | Admitting: Specialist

## 2018-09-11 NOTE — Telephone Encounter (Signed)
Too soon for refill.

## 2018-09-11 NOTE — Telephone Encounter (Signed)
[facility] called [facility] and was told there system will not allow them to send a request for any narcotics. They said the patient have to bring the Rx to them in order for them to fill. The number to contact patient is [phone]

## 2018-09-12 ENCOUNTER — Telehealth (INDEPENDENT_AMBULATORY_CARE_PROVIDER_SITE_OTHER): Payer: Self-pay | Admitting: Radiology

## 2018-09-12 ENCOUNTER — Ambulatory Visit: Payer: BLUE CROSS/BLUE SHIELD | Admitting: Physical Therapy

## 2018-09-12 ENCOUNTER — Other Ambulatory Visit (INDEPENDENT_AMBULATORY_CARE_PROVIDER_SITE_OTHER): Payer: Self-pay | Admitting: Family Medicine

## 2018-09-12 MED ORDER — HYDROCODONE-ACETAMINOPHEN 5-325 MG PO TABS: 1.0000 | ORAL_TABLET | Freq: Four times a day (QID) | ORAL | 0 refills | Status: DC | PRN

## 2018-09-12 NOTE — Telephone Encounter (Signed)
I

## 2018-09-12 NOTE — Telephone Encounter (Signed)
Rx sent 

## 2018-09-12 NOTE — Telephone Encounter (Signed)
I called and advised pt that this has been sent to his pharmacy

## 2018-09-12 NOTE — Telephone Encounter (Signed)
Patient called requesting a refill on his pain meds (Oxycodone 20mg ) However per Dr. Lajoyce Corners its to soon.  I did advise to him that it would have to wait for Dr. Otelia Sergeant on Monday.  He is asking for Norco to help him get through the weekend with as he will be out of the oxycodone today.  Can you please advise?

## 2018-09-15 ENCOUNTER — Encounter: Payer: BLUE CROSS/BLUE SHIELD | Admitting: Physical Therapy

## 2018-09-16 NOTE — Telephone Encounter (Signed)
Patient called to check the status of his pain medication refill

## 2018-09-17 ENCOUNTER — Telehealth (INDEPENDENT_AMBULATORY_CARE_PROVIDER_SITE_OTHER): Payer: Self-pay | Admitting: Specialist

## 2018-09-17 ENCOUNTER — Encounter: Payer: Self-pay | Admitting: Physical Therapy

## 2018-09-17 ENCOUNTER — Ambulatory Visit: Payer: BLUE CROSS/BLUE SHIELD | Admitting: Physical Therapy

## 2018-09-17 DIAGNOSIS — R262 Difficulty in walking, not elsewhere classified: Secondary | ICD-10-CM

## 2018-09-17 DIAGNOSIS — M5441 Lumbago with sciatica, right side: Secondary | ICD-10-CM | POA: Diagnosis not present

## 2018-09-17 DIAGNOSIS — G8929 Other chronic pain: Secondary | ICD-10-CM

## 2018-09-17 DIAGNOSIS — M6281 Muscle weakness (generalized): Secondary | ICD-10-CM

## 2018-09-17 NOTE — Telephone Encounter (Signed)
Patient said he has been calling and leaving messages about getting prescription Oxycodone 25 mg refilled, although I don't see any messages. He did say his pharmacy will not request any narcotics. He said he is already run out because we were supposed to check with Dr. Otelia Sergeant on Monday for refill. Please advise, patient is extremely frustrated. # [phone]
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
## 2018-09-17 NOTE — Telephone Encounter (Signed)
I called and lmom that I spoke with Dr. Otelia Sergeant this morning regarding his refill and that per Dr. Otelia Sergeant he will discuss his pain meds at his appt on 09/18/18.

## 2018-09-18 ENCOUNTER — Ambulatory Visit (INDEPENDENT_AMBULATORY_CARE_PROVIDER_SITE_OTHER): Payer: BLUE CROSS/BLUE SHIELD | Admitting: Specialist

## 2018-09-18 ENCOUNTER — Encounter (INDEPENDENT_AMBULATORY_CARE_PROVIDER_SITE_OTHER): Payer: Self-pay | Admitting: Specialist

## 2018-09-18 ENCOUNTER — Telehealth (INDEPENDENT_AMBULATORY_CARE_PROVIDER_SITE_OTHER): Payer: Self-pay | Admitting: Specialist

## 2018-09-18 ENCOUNTER — Ambulatory Visit (INDEPENDENT_AMBULATORY_CARE_PROVIDER_SITE_OTHER): Payer: BLUE CROSS/BLUE SHIELD

## 2018-09-18 VITALS — BP 108/60 | HR 76 | Ht 70.5 in | Wt 197.0 lb

## 2018-09-18 DIAGNOSIS — L732 Hidradenitis suppurativa: Secondary | ICD-10-CM

## 2018-09-18 DIAGNOSIS — Z981 Arthrodesis status: Secondary | ICD-10-CM

## 2018-09-18 MED ORDER — CELECOXIB 200 MG PO CAPS: 200.0000 mg | ORAL_CAPSULE | Freq: Two times a day (BID) | ORAL | 3 refills | Status: DC

## 2018-09-18 MED ORDER — HYDROCODONE-ACETAMINOPHEN 7.5-325 MG PO TABS: 1.0000 | ORAL_TABLET | Freq: Four times a day (QID) | ORAL | 0 refills | Status: AC | PRN
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
## 2018-09-18 NOTE — Patient Instructions (Signed)
Plan: Avoid frequent bending and stooping  No lifting greater than 10 lbs. May use ice or moist heat for pain. Weight loss is of benefit. Best medication for lumbar disc disease is arthritis medications like motrin, celebrex and naprosyn. Exercise is important to improve your indurance and does allow people to function better inspite of back pain. Pack a single layer of the nu gauze into the right upper buttock wound daily until it can not be packed probably about 2-3 weeks of daily dressing then ABD.  Apply mupirocin oint 2% to the lower wound and 2x2 and tape.

## 2018-09-18 NOTE — Telephone Encounter (Signed)
Patient needs an appointment for a 3 wk follow up around 10/09/18. He is currently scheduled for 12/2. Please add to cancellation list. # [phone]

## 2018-09-18 NOTE — Addendum Note (Signed)
Addended by: Vira Browns on: 09/18/2018 12:59 PM   Modules accepted: Orders

## 2018-09-18 NOTE — Telephone Encounter (Signed)
I put him on the cancellation list and will call if something opens sooner 

## 2018-09-18 NOTE — Addendum Note (Signed)
Addended by: Vira Browns on: 09/18/2018 01:13 PM   Modules accepted: Orders

## 2018-09-19 ENCOUNTER — Ambulatory Visit: Payer: BLUE CROSS/BLUE SHIELD | Admitting: Physical Therapy
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
## 2018-09-23 ENCOUNTER — Ambulatory Visit: Payer: BLUE CROSS/BLUE SHIELD | Admitting: Physical Therapy

## 2018-09-25 ENCOUNTER — Ambulatory Visit: Payer: BLUE CROSS/BLUE SHIELD | Admitting: Physical Therapy

## 2018-09-25 DIAGNOSIS — G8929 Other chronic pain: Secondary | ICD-10-CM

## 2018-09-25 DIAGNOSIS — M6281 Muscle weakness (generalized): Secondary | ICD-10-CM

## 2018-09-25 DIAGNOSIS — R262 Difficulty in walking, not elsewhere classified: Secondary | ICD-10-CM

## 2018-09-25 DIAGNOSIS — M5441 Lumbago with sciatica, right side: Principal | ICD-10-CM
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
## 2018-09-29 ENCOUNTER — Encounter: Payer: BLUE CROSS/BLUE SHIELD | Admitting: Physical Therapy

## 2018-10-01 ENCOUNTER — Ambulatory Visit: Payer: BLUE CROSS/BLUE SHIELD | Attending: Specialist | Admitting: Physical Therapy

## 2018-10-01 ENCOUNTER — Encounter: Payer: Self-pay | Admitting: Physical Therapy

## 2018-10-01 DIAGNOSIS — M6281 Muscle weakness (generalized): Secondary | ICD-10-CM | POA: Diagnosis present

## 2018-10-01 DIAGNOSIS — M5441 Lumbago with sciatica, right side: Secondary | ICD-10-CM | POA: Insufficient documentation

## 2018-10-01 DIAGNOSIS — G8929 Other chronic pain: Secondary | ICD-10-CM | POA: Diagnosis present

## 2018-10-01 DIAGNOSIS — R262 Difficulty in walking, not elsewhere classified: Secondary | ICD-10-CM | POA: Diagnosis present
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
## 2018-10-03 ENCOUNTER — Telehealth: Payer: Self-pay | Admitting: Physical Therapy

## 2018-10-03 ENCOUNTER — Ambulatory Visit: Payer: BLUE CROSS/BLUE SHIELD | Admitting: Physical Therapy

## 2018-10-03 NOTE — Telephone Encounter (Signed)
PTA called and left message on pt's VM to call our office. Pt was a no show for todays appt.   Ane Payment, PTA @TODAY @ 10:27 AM

## 2018-10-06 ENCOUNTER — Other Ambulatory Visit (INDEPENDENT_AMBULATORY_CARE_PROVIDER_SITE_OTHER): Payer: Self-pay | Admitting: Specialist

## 2018-10-06 MED ORDER — HYDROCODONE-ACETAMINOPHEN 7.5-325 MG PO TABS: 1.0000 | ORAL_TABLET | Freq: Four times a day (QID) | ORAL | 0 refills | Status: DC | PRN

## 2018-10-06 NOTE — Telephone Encounter (Signed)
Michael [name],  Patient called to request a refill of Hydrocodone 10-325.  Please call patient to advise.

## 2018-10-06 NOTE — Telephone Encounter (Signed)
Sent request to Dr. Nitka 

## 2018-10-07 ENCOUNTER — Other Ambulatory Visit (INDEPENDENT_AMBULATORY_CARE_PROVIDER_SITE_OTHER): Payer: Self-pay | Admitting: Specialist

## 2018-10-07 NOTE — Telephone Encounter (Signed)
Methocarbamol Refill request 

## 2018-10-08 ENCOUNTER — Ambulatory Visit: Payer: BLUE CROSS/BLUE SHIELD | Admitting: Physical Therapy

## 2018-10-08 ENCOUNTER — Encounter: Payer: Self-pay | Admitting: Physical Therapy

## 2018-10-08 DIAGNOSIS — M5441 Lumbago with sciatica, right side: Secondary | ICD-10-CM | POA: Diagnosis not present

## 2018-10-08 DIAGNOSIS — G8929 Other chronic pain: Secondary | ICD-10-CM

## 2018-10-08 DIAGNOSIS — M6281 Muscle weakness (generalized): Secondary | ICD-10-CM
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
## 2018-10-10 ENCOUNTER — Ambulatory Visit (INDEPENDENT_AMBULATORY_CARE_PROVIDER_SITE_OTHER): Payer: BLUE CROSS/BLUE SHIELD

## 2018-10-10 ENCOUNTER — Ambulatory Visit (INDEPENDENT_AMBULATORY_CARE_PROVIDER_SITE_OTHER): Payer: BLUE CROSS/BLUE SHIELD | Admitting: Specialist

## 2018-10-10 ENCOUNTER — Ambulatory Visit: Payer: BLUE CROSS/BLUE SHIELD | Admitting: Physical Therapy

## 2018-10-10 ENCOUNTER — Encounter (INDEPENDENT_AMBULATORY_CARE_PROVIDER_SITE_OTHER): Payer: Self-pay | Admitting: Specialist

## 2018-10-10 VITALS — BP 117/67 | HR 68 | Ht 70.5 in | Wt 197.0 lb

## 2018-10-10 DIAGNOSIS — Z981 Arthrodesis status: Secondary | ICD-10-CM

## 2018-10-10 DIAGNOSIS — R202 Paresthesia of skin: Secondary | ICD-10-CM

## 2018-10-10 DIAGNOSIS — R200 Anesthesia of skin: Secondary | ICD-10-CM

## 2018-10-10 MED ORDER — HYDROCODONE-ACETAMINOPHEN 10-325 MG PO TABS: 1.0000 | ORAL_TABLET | Freq: Four times a day (QID) | ORAL | 0 refills | Status: DC | PRN

## 2018-10-10 NOTE — Patient Instructions (Signed)
Plan: Avoid frequent bending and stooping  No lifting greater than 10 lbs. May use ice or moist heat for pain. Weight loss is of benefit. Hydrocodone is renewed and a Pain management referral to [facility] and Dr. Vear ClockPhillips anesthesiologist.  Exercise is important to improve your indurance and does allow people to function better inspite of back pain. Due to numbness right arm and feet dysesthesias I am [facility].
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
## 2018-10-13 ENCOUNTER — Ambulatory Visit: Payer: BLUE CROSS/BLUE SHIELD | Admitting: Physical Therapy

## 2018-10-13 DIAGNOSIS — R262 Difficulty in walking, not elsewhere classified: Secondary | ICD-10-CM

## 2018-10-13 DIAGNOSIS — G8929 Other chronic pain: Secondary | ICD-10-CM

## 2018-10-13 DIAGNOSIS — M6281 Muscle weakness (generalized): Secondary | ICD-10-CM

## 2018-10-13 DIAGNOSIS — M5441 Lumbago with sciatica, right side: Principal | ICD-10-CM
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
## 2018-10-14 ENCOUNTER — Encounter (INDEPENDENT_AMBULATORY_CARE_PROVIDER_SITE_OTHER): Payer: Self-pay | Admitting: Specialist

## 2018-10-17 ENCOUNTER — Encounter

## 2018-10-20 ENCOUNTER — Other Ambulatory Visit (INDEPENDENT_AMBULATORY_CARE_PROVIDER_SITE_OTHER): Payer: Self-pay | Admitting: Specialist

## 2018-10-20 MED ORDER — METHOCARBAMOL 500 MG PO TABS: ORAL_TABLET | ORAL | 1 refills | Status: DC

## 2018-10-20 MED ORDER — HYDROCODONE-ACETAMINOPHEN 10-325 MG PO TABS: 1.0000 | ORAL_TABLET | Freq: Four times a day (QID) | ORAL | 0 refills | Status: DC | PRN

## 2018-10-20 NOTE — Telephone Encounter (Signed)
Patient called to get refill of Hydrocodone & Robaxin  Please call patient to advise.  Please call [phone]

## 2018-10-20 NOTE — Telephone Encounter (Signed)
Sent request to Dr. Nitka 

## 2018-10-21 ENCOUNTER — Ambulatory Visit: Payer: BLUE CROSS/BLUE SHIELD | Admitting: Physical Therapy

## 2018-10-21 ENCOUNTER — Encounter: Payer: Self-pay | Admitting: Physical Therapy

## 2018-10-21 DIAGNOSIS — M6281 Muscle weakness (generalized): Secondary | ICD-10-CM

## 2018-10-21 DIAGNOSIS — M5441 Lumbago with sciatica, right side: Principal | ICD-10-CM

## 2018-10-21 DIAGNOSIS — R262 Difficulty in walking, not elsewhere classified: Secondary | ICD-10-CM

## 2018-10-21 DIAGNOSIS — G8929 Other chronic pain: Secondary | ICD-10-CM
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
## 2018-10-22 ENCOUNTER — Ambulatory Visit: Payer: BLUE CROSS/BLUE SHIELD | Admitting: Neurology

## 2018-10-27 ENCOUNTER — Ambulatory Visit (INDEPENDENT_AMBULATORY_CARE_PROVIDER_SITE_OTHER): Payer: BLUE CROSS/BLUE SHIELD | Admitting: Specialist

## 2018-10-28 ENCOUNTER — Encounter: Payer: Self-pay | Admitting: Neurology

## 2018-10-29 ENCOUNTER — Ambulatory Visit: Payer: BLUE CROSS/BLUE SHIELD | Admitting: Physical Therapy

## 2018-10-29 ENCOUNTER — Ambulatory Visit: Payer: BLUE CROSS/BLUE SHIELD | Attending: Specialist | Admitting: Physical Therapy

## 2018-10-29 DIAGNOSIS — M6281 Muscle weakness (generalized): Secondary | ICD-10-CM | POA: Insufficient documentation

## 2018-10-29 DIAGNOSIS — R262 Difficulty in walking, not elsewhere classified: Secondary | ICD-10-CM | POA: Insufficient documentation

## 2018-10-29 DIAGNOSIS — M5441 Lumbago with sciatica, right side: Secondary | ICD-10-CM | POA: Insufficient documentation

## 2018-10-29 DIAGNOSIS — G8929 Other chronic pain: Secondary | ICD-10-CM | POA: Diagnosis present
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
## 2018-10-31 ENCOUNTER — Other Ambulatory Visit (INDEPENDENT_AMBULATORY_CARE_PROVIDER_SITE_OTHER): Payer: Self-pay | Admitting: Specialist

## 2018-10-31 ENCOUNTER — Ambulatory Visit: Payer: BLUE CROSS/BLUE SHIELD | Admitting: Physical Therapy

## 2018-10-31 MED ORDER — HYDROCODONE-ACETAMINOPHEN 7.5-325 MG PO TABS: 1.0000 | ORAL_TABLET | Freq: Four times a day (QID) | ORAL | 0 refills | Status: DC | PRN

## 2018-10-31 NOTE — Telephone Encounter (Signed)
Patient called left voicemail message needing Rx refilled (Hydrocodone) The number to contact patient is [phone]

## 2018-10-31 NOTE — Telephone Encounter (Signed)
Sent request to dr. nitka  

## 2018-11-05 ENCOUNTER — Other Ambulatory Visit (INDEPENDENT_AMBULATORY_CARE_PROVIDER_SITE_OTHER): Payer: Self-pay | Admitting: Specialist

## 2018-11-05 NOTE — Telephone Encounter (Signed)
Gabapentin refill request 

## 2018-11-06 ENCOUNTER — Other Ambulatory Visit (INDEPENDENT_AMBULATORY_CARE_PROVIDER_SITE_OTHER): Payer: Self-pay | Admitting: Radiology

## 2018-11-06 MED ORDER — HYDROCODONE-ACETAMINOPHEN 7.5-325 MG PO TABS: 1.0000 | ORAL_TABLET | Freq: Four times a day (QID) | ORAL | 0 refills | Status: DC | PRN

## 2018-11-06 MED ORDER — METHOCARBAMOL 500 MG PO TABS: ORAL_TABLET | ORAL | 1 refills | Status: DC

## 2018-11-06 NOTE — Telephone Encounter (Signed)
I called and advsied that his methocarbamol was sent to the pharm but the Norco has to be picked up

## 2018-11-17 ENCOUNTER — Other Ambulatory Visit (INDEPENDENT_AMBULATORY_CARE_PROVIDER_SITE_OTHER): Payer: Self-pay | Admitting: Specialist

## 2018-11-17 MED ORDER — HYDROCODONE-ACETAMINOPHEN 7.5-325 MG PO TABS: 1.0000 | ORAL_TABLET | Freq: Four times a day (QID) | ORAL | 0 refills | Status: DC | PRN

## 2018-11-17 NOTE — Telephone Encounter (Signed)
I spoke with Dr. Otelia SergeantNitka and he states [place] is only [place] for [place]. I called and advised patient of this and he states that he understands this.  He would like it set to his local pharmacy and he will get it as soon as he is back.

## 2018-11-17 NOTE — Telephone Encounter (Signed)
Patient request Rx refill of Hydrocodone 10/325mg . He states [place]'s currently in [place] for vacation.  [facility] [phone]

## 2018-11-24 ENCOUNTER — Ambulatory Visit (INDEPENDENT_AMBULATORY_CARE_PROVIDER_SITE_OTHER): Payer: BLUE CROSS/BLUE SHIELD | Admitting: Physician Assistant

## 2018-11-24 ENCOUNTER — Other Ambulatory Visit: Payer: Self-pay

## 2018-11-24 ENCOUNTER — Encounter (INDEPENDENT_AMBULATORY_CARE_PROVIDER_SITE_OTHER): Payer: Self-pay | Admitting: Physician Assistant

## 2018-11-24 VITALS — BP 106/70 | HR 84 | Temp 98.1°F | Ht 70.5 in | Wt 205.0 lb

## 2018-11-24 DIAGNOSIS — I10 Essential (primary) hypertension: Secondary | ICD-10-CM | POA: Diagnosis not present

## 2018-11-24 DIAGNOSIS — G8929 Other chronic pain: Secondary | ICD-10-CM

## 2018-11-24 DIAGNOSIS — G4700 Insomnia, unspecified: Secondary | ICD-10-CM

## 2018-11-24 DIAGNOSIS — M5441 Lumbago with sciatica, right side: Secondary | ICD-10-CM | POA: Diagnosis not present

## 2018-11-24 MED ORDER — DULOXETINE HCL 60 MG PO CPEP: 60.0000 mg | ORAL_CAPSULE | Freq: Every day | ORAL | 3 refills | Status: DC

## 2018-11-24 NOTE — Patient Instructions (Signed)

## 2018-11-24 NOTE — Progress Notes (Signed)
Subjective:  Patient ID: Michael [name], male    DOB: Nov 07, 1966  Age: 52 y.o. MRN: [id number]  CC:   HPI Michael [name] is a 52 y.o. male with a medical history of anxiety, anemia, asthma, depression, GERD, HTN, Insomnia, migraines, hidradenitis suppurativa, and OSA presents as a new patient to establish care. He has recently been seen by his PCP in [facility] but finds the commute to be too long. Pt also going to psychiatrist and psychologist with [facility] behavioral health department. Having trouble maintaining sleep. Is taking Trazodone and Ambien as this is the only combination of pills to help him sleep. Does not endorse any other symptoms or complaints at this time.    Outpatient Medications Prior to Visit  Medication Sig Dispense Refill  . celecoxib (CELEBREX) 200 MG capsule Take 1 capsule (200 mg total) by mouth 2 (two) times daily. 60 capsule 3  . dapsone 100 MG tablet Take 100 mg by mouth daily.     . ferrous sulfate 325 (65 FE) MG tablet Take 325 mg by mouth daily before supper.    . gabapentin (NEURONTIN) 300 MG capsule TAKE ONE CAPSULE BY MOUTH 3 TIMES A DAY 90 capsule 3  . hydrochlorothiazide (HYDRODIURIL) 25 MG tablet Take 25 mg by mouth daily.    Marland Kitchen HYDROcodone-acetaminophen (NORCO) 7.5-325 MG tablet Take 1 tablet by mouth every 6 (six) hours as needed for moderate pain. 30 tablet 0  . lisinopril (PRINIVIL,ZESTRIL) 40 MG tablet Take 40 mg by mouth daily.    . methocarbamol (ROBAXIN) 500 MG tablet TAKE 1 TABLET EVERY 6 HOURS AS NEEDED FOR MUSCLE SPASMS 30 tablet 1  . montelukast (SINGULAIR) 10 MG tablet Take 10 mg by mouth at bedtime.    Marland Kitchen omeprazole (PRILOSEC) 20 MG capsule Take 1 capsule (20 mg total) by mouth 2 (two) times daily. 60 capsule 3  . potassium chloride (MICRO-K) 10 MEQ CR capsule Take 10 mEq by mouth daily.    Marland Kitchen PROAIR HFA 108 (90 Base) MCG/ACT inhaler Inhale 2 puffs into the lungs every 4 (four) hours as needed for wheezing or shortness of breath.     .  propranolol (INDERAL) 10 MG tablet Take 10 mg by mouth 2 (two) times daily.  0  . traZODone (DESYREL) 100 MG tablet Take 200 mg by mouth at bedtime.     Marland Kitchen zolpidem (AMBIEN) 10 MG tablet Take 10 mg by mouth at bedtime.     . Adalimumab (HUMIRA) 40 MG/0.8ML PSKT Inject 40 mg into the skin every Thursday.     Marland Kitchen CIALIS 5 MG tablet Take 5 mg by mouth daily as needed for erectile dysfunction.    Marland Kitchen EPINEPHrine 0.3 mg/0.3 mL IJ SOAJ injection Inject 0.3 mg into the muscle once.   0  . ferrous gluconate (FERGON) 324 MG tablet Take 1 tablet (324 mg total) by mouth 2 (two) times daily with a meal. 60 tablet 3  . lactulose (CHRONULAC) 10 GM/15ML solution Take 15 mLs (10 g total) 3 (three) times daily by mouth. (Patient taking differently: Take 10 g by mouth 2 (two) times daily as needed for moderate constipation. ) 240 mL 0  . Melatonin 1 MG TABS Take 2 mg by mouth at bedtime as needed (for sleep).    . naloxone (NARCAN) nasal spray 4 mg/0.1 mL Take as directed for respiratory depression or narcotic overdose. 1 kit 0  . ondansetron (ZOFRAN) 8 MG tablet Take 8 mg by mouth every 8 (eight) hours as needed  for nausea or vomiting.    . Oxycodone HCl 20 MG TABS Take 1 tablet (20 mg total) by mouth every 4 (four) hours as needed ((score 7 to 10)). 40 tablet 0  . SSD 1 % cream Apply 1 application topically daily as needed (shoulder).   0  . clindamycin (CLEOCIN) 300 MG capsule Take one tablet one hour prior to dental appointment the repeat in 8 hours times 1. 6 capsule 0  . Lifitegrast (XIIDRA) 5 % SOLN Place 1 drop into both eyes 2 (two) times daily.    . mupirocin ointment (BACTROBAN) 2 % Apply to area of erythrema left lower lumbar region BID. 22 g 0   No facility-administered medications prior to visit.      ROS Review of Systems  Constitutional: Negative for chills, fever and malaise/fatigue.  Eyes: Negative for blurred vision.  Respiratory: Negative for shortness of breath.   Cardiovascular: Negative for  chest pain and palpitations.  Gastrointestinal: Negative for abdominal pain and nausea.  Genitourinary: Negative for dysuria and hematuria.  Musculoskeletal: Negative for joint pain and myalgias.  Skin: Negative for rash.  Neurological: Negative for tingling and headaches.  Psychiatric/Behavioral: Negative for depression. The patient has insomnia. The patient is not nervous/anxious.     Objective:  BP 106/70 (BP Location: Left Arm, Patient Position: Sitting, Cuff Size: Normal)   Pulse 84   Temp 98.1 F (36.7 C) (Oral)   Ht 5' 10.5" (1.791 m)   Wt 205 lb (93 kg)   SpO2 95%   BMI 29.00 kg/m   BP/Weight 11/24/2018 10/10/2018 63/89/3734  Systolic BP 287 681 157  Diastolic BP 70 67 60  Wt. (Lbs) 205 197 197  BMI 29 27.87 27.87      Physical Exam Vitals signs reviewed.  Constitutional:      Comments: Well developed, well nourished, NAD, polite  HENT:     Head: Normocephalic and atraumatic.  Eyes:     General: No scleral icterus. Neck:     Musculoskeletal: Normal range of motion and neck supple.     Thyroid: No thyromegaly.     Comments: No thyromegaly Cardiovascular:     Rate and Rhythm: Normal rate and regular rhythm.     Heart sounds: Normal heart sounds.  Pulmonary:     Effort: Pulmonary effort is normal.     Breath sounds: Normal breath sounds.  Skin:    General: Skin is warm and dry.     Coloration: Skin is not pale.     Findings: No erythema or rash.  Neurological:     Mental Status: He is alert and oriented to person, place, and time.  Psychiatric:        Behavior: Behavior normal.        Thought Content: Thought content normal.     Comments: Mildly depressed mood, constricted affect. Fair judgment and insight.       Assessment & Plan:    1. Essential hypertension - Well controlled. Continue current treatment regimen.  - CBC with Differential; Future - Comprehensive metabolic panel; Future - Lipid panel; Future  2. Chronic bilateral low back pain  with right-sided sciatica - Ambulatory referral to Pain Clinic - DULoxetine (CYMBALTA) 60 MG capsule; Take 1 capsule (60 mg total) by mouth daily.  Dispense: 30 capsule; Refill: 3 - Continue Gabapentin 300 mg   3. Insomnia, unspecified type - Seems a combination of chronic LBP and mood disorder are contributing to his sleep disturbance. I will begin Cymbalta as  a way to help with LBP and mood disorder.  - Stop Trazodone 200 mg.  - Continue your current rx for Ambien - Begin Cymbalta 60 mg   Meds ordered this encounter  Medications  . DULoxetine (CYMBALTA) 60 MG capsule    Sig: Take 1 capsule (60 mg total) by mouth daily.    Dispense:  30 capsule    Refill:  3    Order Specific Question:   Supervising Provider    Answer:   Charlott Rakes [[id number]]    Follow-up: Return in about 4 weeks (around 12/22/2018) for insomnia and chronic lower back pain s/p spinal fusion.   Clent Demark PA

## 2018-11-25 ENCOUNTER — Ambulatory Visit: Payer: BLUE CROSS/BLUE SHIELD | Admitting: Physical Therapy

## 2018-11-26 DIAGNOSIS — D649 Anemia, unspecified: Secondary | ICD-10-CM

## 2018-11-26 HISTORY — DX: Anemia, unspecified: D64.9

## 2018-12-03 ENCOUNTER — Other Ambulatory Visit (INDEPENDENT_AMBULATORY_CARE_PROVIDER_SITE_OTHER): Payer: Self-pay | Admitting: Physician Assistant

## 2018-12-03 ENCOUNTER — Other Ambulatory Visit (INDEPENDENT_AMBULATORY_CARE_PROVIDER_SITE_OTHER): Payer: Self-pay | Admitting: Specialist

## 2018-12-03 MED ORDER — HYDROCODONE-ACETAMINOPHEN 5-325 MG PO TABS: 1.0000 | ORAL_TABLET | Freq: Four times a day (QID) | ORAL | 0 refills | Status: DC | PRN

## 2018-12-03 NOTE — Telephone Encounter (Signed)
Will bring to you

## 2018-12-03 NOTE — Telephone Encounter (Signed)
I called and advised that Michael [name] approved enough of the medication till Monday.

## 2018-12-03 NOTE — Telephone Encounter (Signed)
[facility]

## 2018-12-09 ENCOUNTER — Other Ambulatory Visit (INDEPENDENT_AMBULATORY_CARE_PROVIDER_SITE_OTHER): Payer: Self-pay | Admitting: Specialist

## 2018-12-09 NOTE — Telephone Encounter (Signed)
Patient called and requested a refill 10mg  [facility] to be [facility] [address]

## 2018-12-10 NOTE — Telephone Encounter (Signed)
Sent request to Dr. Nitka 

## 2018-12-11 ENCOUNTER — Other Ambulatory Visit (INDEPENDENT_AMBULATORY_CARE_PROVIDER_SITE_OTHER): Payer: Self-pay | Admitting: Specialist

## 2018-12-11 ENCOUNTER — Telehealth (INDEPENDENT_AMBULATORY_CARE_PROVIDER_SITE_OTHER): Payer: Self-pay | Admitting: Specialist

## 2018-12-11 MED ORDER — HYDROCODONE-ACETAMINOPHEN 5-325 MG PO TABS: 1.0000 | ORAL_TABLET | Freq: Four times a day (QID) | ORAL | 0 refills | Status: DC | PRN

## 2018-12-11 NOTE — Telephone Encounter (Signed)
Patient stated has called for several days asking for Hydrocodone and has had no reply.  Wants someone to call him to let him know when he can get the refill.  Please call patient to advise.

## 2018-12-12 ENCOUNTER — Telehealth (INDEPENDENT_AMBULATORY_CARE_PROVIDER_SITE_OTHER): Payer: Self-pay | Admitting: Specialist

## 2018-12-12 NOTE — Telephone Encounter (Signed)
I called patient back, I advised that this was the last rx that sent in for him. He states that he usually gets 7.5/325mg  Norco.  He states that he and Dr. Otelia Sergeant had a deal that his medicine would not be decreased anymore till she see the neurologist which is on 01/13/2019 @1pm .  (However he is calling in for 10/325 Norco)  He says that the meds the were called is only a 4 day supply and he says he will run out before the end of the weekend, but they should last till Monday.-----Please advise

## 2018-12-12 NOTE — Telephone Encounter (Signed)
Patient called concerning his Rx for Hydrocodone. Patient said he usually get 7 to 10 days of the Hydrocodone. Patient said the Rx was for 4 day only and  had the wrong dosage. Patient asked for a call back concerning the medication. Patient said he will run out of the medicine by the end of the  weekend. The number to contact patient is [phone]

## 2018-12-12 NOTE — Telephone Encounter (Signed)
See other mesasge

## 2018-12-15 ENCOUNTER — Encounter (INDEPENDENT_AMBULATORY_CARE_PROVIDER_SITE_OTHER): Payer: Self-pay | Admitting: Specialist

## 2018-12-15 ENCOUNTER — Other Ambulatory Visit (INDEPENDENT_AMBULATORY_CARE_PROVIDER_SITE_OTHER): Payer: Self-pay | Admitting: Radiology

## 2018-12-15 MED ORDER — HYDROCODONE-ACETAMINOPHEN 5-325 MG PO TABS: 1.0000 | ORAL_TABLET | Freq: Four times a day (QID) | ORAL | 0 refills | Status: DC | PRN

## 2018-12-16 ENCOUNTER — Telehealth (INDEPENDENT_AMBULATORY_CARE_PROVIDER_SITE_OTHER): Payer: Self-pay | Admitting: Radiology

## 2018-12-16 NOTE — Telephone Encounter (Signed)
Dr Otelia Sergeant,  What's going on? I haven't heard anything from you in months. I need hydrocodone for pain control or have you changed my treatment plan without discussing it with me?  Michael [name]   ---Patient was on 7.5-325mg  tablets, he was changed by a provider that refilled it while you were out of the office.  Please advise.

## 2018-12-24 ENCOUNTER — Other Ambulatory Visit (INDEPENDENT_AMBULATORY_CARE_PROVIDER_SITE_OTHER): Payer: Self-pay | Admitting: Specialist

## 2018-12-24 NOTE — Telephone Encounter (Signed)
Sent request to Dr. Nitka 

## 2018-12-24 NOTE — Telephone Encounter (Signed)
Patient called needing Rx refilled (Hydrocodone) The number to contact patient is [phone]

## 2018-12-25 MED ORDER — HYDROCODONE-ACETAMINOPHEN 5-325 MG PO TABS: 1.0000 | ORAL_TABLET | Freq: Four times a day (QID) | ORAL | 0 refills | Status: DC | PRN

## 2018-12-25 NOTE — Telephone Encounter (Signed)
Patient called back and wanted to know about medication be refilled. Wants Christy to give him a call

## 2018-12-26 NOTE — Telephone Encounter (Signed)
I called and advised that his meds were sent in last night, patient states that he is going to Pain Management on 01/26/2019

## 2018-12-26 NOTE — Telephone Encounter (Signed)
This has been done.

## 2019-01-01 ENCOUNTER — Other Ambulatory Visit (INDEPENDENT_AMBULATORY_CARE_PROVIDER_SITE_OTHER): Payer: Self-pay | Admitting: Specialist

## 2019-01-01 NOTE — Telephone Encounter (Signed)
Pt called requesting refill on hydrocodone  

## 2019-01-01 NOTE — Telephone Encounter (Signed)
Sent request to Dr. Nitka 

## 2019-01-02 NOTE — Telephone Encounter (Signed)
Patient called back to check the status of his refill request.  Thank you.

## 2019-01-05 MED ORDER — HYDROCODONE-ACETAMINOPHEN 5-325 MG PO TABS: 1.0000 | ORAL_TABLET | Freq: Four times a day (QID) | ORAL | 0 refills | Status: DC | PRN

## 2019-01-05 NOTE — Telephone Encounter (Signed)
norco refill request 

## 2019-01-08 HISTORY — PX: UPPER GI ENDOSCOPY: SHX6162

## 2019-01-11 ENCOUNTER — Other Ambulatory Visit (INDEPENDENT_AMBULATORY_CARE_PROVIDER_SITE_OTHER): Payer: Self-pay | Admitting: Specialist

## 2019-01-12 NOTE — Telephone Encounter (Signed)
Celecoxib refill request  

## 2019-01-13 ENCOUNTER — Ambulatory Visit (INDEPENDENT_AMBULATORY_CARE_PROVIDER_SITE_OTHER): Payer: BLUE CROSS/BLUE SHIELD | Admitting: Neurology

## 2019-01-13 ENCOUNTER — Encounter: Payer: Self-pay | Admitting: Neurology

## 2019-01-13 ENCOUNTER — Other Ambulatory Visit (INDEPENDENT_AMBULATORY_CARE_PROVIDER_SITE_OTHER): Payer: Self-pay | Admitting: Orthopedic Surgery

## 2019-01-13 VITALS — BP 117/74 | HR 72 | Ht 70.5 in | Wt 208.0 lb

## 2019-01-13 DIAGNOSIS — G959 Disease of spinal cord, unspecified: Secondary | ICD-10-CM

## 2019-01-13 DIAGNOSIS — R202 Paresthesia of skin: Secondary | ICD-10-CM

## 2019-01-13 DIAGNOSIS — M4712 Other spondylosis with myelopathy, cervical region: Secondary | ICD-10-CM | POA: Diagnosis not present

## 2019-01-13 DIAGNOSIS — G621 Alcoholic polyneuropathy: Secondary | ICD-10-CM

## 2019-01-13 DIAGNOSIS — M4722 Other spondylosis with radiculopathy, cervical region: Secondary | ICD-10-CM

## 2019-01-13 DIAGNOSIS — R200 Anesthesia of skin: Secondary | ICD-10-CM

## 2019-01-13 NOTE — Telephone Encounter (Signed)
Michael [name] called in to request that Dr. Otelia Sergeant refill his oxycodone Rx.  He uses the [facility].
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
## 2019-01-13 NOTE — Patient Instructions (Signed)

## 2019-01-14 MED ORDER — HYDROCODONE-ACETAMINOPHEN 5-325 MG PO TABS: 1.0000 | ORAL_TABLET | Freq: Four times a day (QID) | ORAL | 0 refills | Status: DC | PRN

## 2019-01-14 NOTE — Addendum Note (Signed)
Addended by: Penne Lash, Neysa Bonito N on: 01/14/2019 11:07 AM   Modules accepted: Orders

## 2019-01-14 NOTE — Telephone Encounter (Signed)
Sent request to Dr. Nitka 

## 2019-01-15 ENCOUNTER — Ambulatory Visit (INDEPENDENT_AMBULATORY_CARE_PROVIDER_SITE_OTHER): Payer: BLUE CROSS/BLUE SHIELD | Admitting: Specialist

## 2019-01-15 ENCOUNTER — Ambulatory Visit (INDEPENDENT_AMBULATORY_CARE_PROVIDER_SITE_OTHER): Payer: BLUE CROSS/BLUE SHIELD | Admitting: Neurology

## 2019-01-15 DIAGNOSIS — G621 Alcoholic polyneuropathy: Secondary | ICD-10-CM | POA: Diagnosis not present

## 2019-01-15 DIAGNOSIS — Z0289 Encounter for other administrative examinations: Secondary | ICD-10-CM

## 2019-01-15 DIAGNOSIS — G5620 Lesion of ulnar nerve, unspecified upper limb: Secondary | ICD-10-CM

## 2019-01-15 NOTE — Progress Notes (Signed)
Full Name: Michael [name] Gender: Male MRN #: [id number] Date of Birth: [birth year]    Visit Date: 01/15/2019 08:37 Age: 53 Years 0 Months Old Examining Physician: Naomie Dean, MD  Referring Physician: Vira Browns, MD  History: 53 y.o. male here as requested by provider Vira Browns  for numbness in the right 4th and 5th digits and feet paresthesias and numbness.   He has a past medical history of alcohol abuse, lumbar radiculopathy, cirrhosis and esophageal varices,  spinal stenosis s/p surgery x 2, upper GI bleed and anemia.  Summary: EMG/NCS was performed on the right upper and right lower extremities.   The right Peroneal EDB motor nerve showed reduced amplitude(0.9mV, N>2) and decreased conduction velocity (22m/s, N>44). The right Tibial AH motor nerve showed decreased conduction velocity (79m/s, N>41). The right Radial sensory nerve showed reduced amplitude (12uV, N>15). The right Sural sensory nerve showed borderline peak latency (2.71ms, N<2.9) and borderling amplitude (6uV, N>6). The right superficial peroneal sensory nerve showed no result. The right  Ulnar FDI motor nerve showed reduced amplitude (6.23mV, N>7), decreased conduction velocity ( B Elbow-wrist, 48m/s, N>50), decreased conduction velocity (A Elbow-B Elbow, 46 m/s, N>50).   The right Median 2nd Digit orthodromic sensory nerve showed reduced amplitude(9 uV, N>10). The right Ulnar 5th Digit orthodromic sensory nerve showed no response. F Wave studies indicate that the right Ulnar F wave has delayed latency(33.5, N<94ms) and the right Tibial F wave has delayed latency(60.5, N<50ms). All remaining nerves (as indicated in the following tables) were within normal limits.  The right FDI showed increased spontaneous activity. The right FDP showed decreased insertional activity and diminished motor recruitment.All remaining muscles (as indicated in the following tables) were within normal limits.      Conclusion:  1. There is  electrophysiologic evidence of severe right ulnar neuropathy with acute/ongoing denervation in a distal ulnar-innervated muscle and chronic neurogenic changes in a forearm ulnar muscle.  EMG needle study localizes the ulnar neuropathy at or proximal to the takeoff of the Flexor Digitorum Profundus muscle in the forearm.  But given decreased velocity across the elbow suspect ulnar neuropathy at the elbow.   2. Patient elected not to have emg needle study on the right leg but given clinical history and symptoms, nerve conduction studies consistent with remote right lumbar radiculopathy as well as concomitant mild axonal length-dependent polyneuropathy.   Naomie Dean M.D.  [facility] [address] [place], [place] [address] Tel: [phone] Fax: [phone]   cc:Dr. Otelia Sergeant     [facility]    Nerve / Sites Muscle Latency Ref. Amplitude Ref. Rel Amp Segments Distance Velocity Ref. Area    ms ms mV mV %  cm m/s m/s mVms  R Median - APB     Wrist APB 3.3 ?4.4 6.9 ?4.0 100 Wrist - APB 7   21.9     Upper arm APB 8.3  6.5  94.4 Upper arm - Wrist 25 49 ?49 18.9  R Ulnar - ADM     Wrist ADM 2.9 ?3.3 7.9 ?6.0 100 Wrist - ADM 7   28.7     B.Elbow ADM 7.6  7.5  94.3 B.Elbow - Wrist 23 49 ?49 27.6     A.Elbow ADM 9.6  7.5  101 A.Elbow - B.Elbow 10 49 ?49 27.2         A.Elbow - Wrist      R Peroneal - EDB     Ankle EDB 5.6 ?6.5 0.4 ?2.0  100 Ankle - EDB 9   1.6     Fib head EDB 13.4  0.4  95.7 Fib head - Ankle 30 38 ?44 1.5     Pop fossa EDB 16.0  0.4  92.2 Pop fossa - Fib head 10 38 ?44 1.6         Pop fossa - Ankle      R Tibial - AH     Ankle AH 3.7 ?5.8 15.5 ?4.0 100 Ankle - AH 9   32.7     Pop fossa AH 15.0  10.9  70.3 Pop fossa - Ankle 39 35 ?41 26.0  R Ulnar - FDI     Wrist FDI 4.2 ?4.5 6.1 ?7.0 100 Wrist - FDI 8   12.0     B.Elbow FDI 9.1  6.1  100 B.Elbow - Wrist 21 43 ?49 12.6     A.Elbow FDI 11.3  6.5  105 A.Elbow - B.Elbow 10 46 ?49 13.9         A.Elbow - Wrist                    SNC    Nerve / Sites Rec. Site Peak Lat Ref.  Amp Ref. Segments Distance    ms ms V V  cm  R Radial - Anatomical snuff box (Forearm)     Forearm Wrist 2.8 ?2.9 12 ?15 Forearm - Wrist 10  R Sural - Ankle (Calf)     Calf Ankle 3.4 ?4.4 6 ?6 Calf - Ankle 14  R Superficial peroneal - Ankle     Lat leg Ankle NR ?4.4 NR ?6 Lat leg - Ankle 14  R Median - Orthodromic (Dig II, Mid palm)     Dig II Wrist 3.3 ?3.4 9 ?10 Dig II - Wrist 13  R Ulnar - Orthodromic, (Dig V, Mid palm)     Dig V Wrist NR ?3.1 NR ?5 Dig V - Wrist 108               F  Wave    Nerve F Lat Ref.   ms ms  R Tibial - AH 60.5 ?56.0  R Ulnar - ADM 33.5 ?32.0         EMG full       EMG Summary Table    Spontaneous MUAP Recruitment  Muscle IA Fib PSW Fasc Other Amp Dur. Poly Pattern  R. Deltoid Normal None None None _______ Normal Normal Normal Normal  R. Triceps brachii Normal None None None _______ Normal Normal Normal Normal  R. Pronator teres Normal None None None _______ Normal Normal Normal Normal  R. First dorsal interosseous Normal 2+ 3+ None _______ Normal Normal Normal Reduced  R. Opponens pollicis Normal None None None _______ Normal Normal Normal Normal  R. Flexor digitorum profundus (Ulnar) Decreased None None None _______ Normal Normal Normal Reduced

## 2019-01-17 LAB — SJOGREN'S SYNDROME ANTIBODS(SSA + SSB): ENA SSB (LA) Ab: <0.2 AI (ref 0.0–0.9)

## 2019-01-17 LAB — HEAVY METALS, BLOOD
Arsenic: 9 ug/L (ref 2–23)
Lead, Blood: NOT DETECTED ug/dL (ref 0–4)
Mercury: NOT DETECTED ug/L (ref 0.0–14.9)

## 2019-01-17 LAB — MULTIPLE MYELOMA PANEL, SERUM
ALBUMIN/GLOB SERPL: 1.1 (ref 0.7–1.7)
Albumin SerPl Elph-Mcnc: 3.6 g/dL (ref 2.9–4.4)
Alpha 1: 0.3 g/dL (ref 0.0–0.4)
Alpha2 Glob SerPl Elph-Mcnc: 0.6 g/dL (ref 0.4–1.0)
B-Globulin SerPl Elph-Mcnc: 1.7 g/dL — ABNORMAL HIGH (ref 0.7–1.3)
GAMMA GLOB SERPL ELPH-MCNC: 1 g/dL (ref 0.4–1.8)
Globulin, Total: 3.6 g/dL (ref 2.2–3.9)
IgA/Immunoglobulin A, Serum: 1216 mg/dL — ABNORMAL HIGH (ref 90–386)
IgG (Immunoglobin G), Serum: 1074 mg/dL (ref 700–1600)
IgM (Immunoglobulin M), Srm: 90 mg/dL (ref 20–172)
Total Protein: 7.2 g/dL (ref 6.0–8.5)

## 2019-01-17 LAB — VITAMIN B6: Vitamin B6: 9 ug/L (ref 5.3–46.7)

## 2019-01-17 LAB — VITAMIN B1: Thiamine: 72.1 nmol/L (ref 66.5–200.0)

## 2019-01-18 NOTE — Progress Notes (Signed)
See procedure note.

## 2019-01-20 ENCOUNTER — Telehealth: Payer: Self-pay | Admitting: Neurology

## 2019-01-20 NOTE — Procedures (Signed)
Full Name: Michael [name] Gender: Male MRN #: [id number] Date of Birth: [birth year]    Visit Date: 01/15/2019 08:37 Age: 53 Years 0 Months Old Examining Physician: Naomie Dean, MD  Referring Physician: Vira Browns, MD  History: 53 y.o. male here as requested by provider Vira Browns  for numbness in the right 4th and 5th digits and feet paresthesias and numbness.   He has a past medical history of alcohol abuse, lumbar radiculopathy, cirrhosis and esophageal varices,  spinal stenosis s/p surgery x 2, upper GI bleed and anemia.  Summary: EMG/NCS was performed on the right upper and right lower extremities.   The right Peroneal EDB motor nerve showed reduced amplitude(0.9mV, N>2) and decreased conduction velocity (22m/s, N>44). The right Tibial AH motor nerve showed decreased conduction velocity (79m/s, N>41). The right Radial sensory nerve showed reduced amplitude (12uV, N>15). The right Sural sensory nerve showed borderline peak latency (2.71ms, N<2.9) and borderling amplitude (6uV, N>6). The right superficial peroneal sensory nerve showed no result. The right  Ulnar FDI motor nerve showed reduced amplitude (6.23mV, N>7), decreased conduction velocity ( B Elbow-wrist, 48m/s, N>50), decreased conduction velocity (A Elbow-B Elbow, 46 m/s, N>50).   The right Median 2nd Digit orthodromic sensory nerve showed reduced amplitude(9 uV, N>10). The right Ulnar 5th Digit orthodromic sensory nerve showed no response. F Wave studies indicate that the right Ulnar F wave has delayed latency(33.5, N<94ms) and the right Tibial F wave has delayed latency(60.5, N<50ms). All remaining nerves (as indicated in the following tables) were within normal limits.  The right FDI showed increased spontaneous activity. The right FDP showed decreased insertional activity and diminished motor recruitment.All remaining muscles (as indicated in the following tables) were within normal limits.      Conclusion:  1. There is  electrophysiologic evidence of severe right ulnar neuropathy with acute/ongoing denervation in a distal ulnar-innervated muscle and chronic neurogenic changes in a forearm ulnar muscle.  EMG needle study localizes the ulnar neuropathy at or proximal to the takeoff of the Flexor Digitorum Profundus muscle in the forearm.  But given decreased velocity across the elbow suspect ulnar neuropathy at the elbow.   2. Patient elected not to have emg needle study on the right leg but given clinical history and symptoms, nerve conduction studies consistent with remote right lumbar radiculopathy as well as concomitant mild axonal length-dependent polyneuropathy.   Naomie Dean M.D.  [facility] [address] [place], [place] [address] Tel: [phone] Fax: [phone]   cc:Dr. Otelia Sergeant     [facility]    Nerve / Sites Muscle Latency Ref. Amplitude Ref. Rel Amp Segments Distance Velocity Ref. Area    ms ms mV mV %  cm m/s m/s mVms  R Median - APB     Wrist APB 3.3 ?4.4 6.9 ?4.0 100 Wrist - APB 7   21.9     Upper arm APB 8.3  6.5  94.4 Upper arm - Wrist 25 49 ?49 18.9  R Ulnar - ADM     Wrist ADM 2.9 ?3.3 7.9 ?6.0 100 Wrist - ADM 7   28.7     B.Elbow ADM 7.6  7.5  94.3 B.Elbow - Wrist 23 49 ?49 27.6     A.Elbow ADM 9.6  7.5  101 A.Elbow - B.Elbow 10 49 ?49 27.2         A.Elbow - Wrist      R Peroneal - EDB     Ankle EDB 5.6 ?6.5 0.4 ?2.0  100 Ankle - EDB 9   1.6     Fib head EDB 13.4  0.4  95.7 Fib head - Ankle 30 38 ?44 1.5     Pop fossa EDB 16.0  0.4  92.2 Pop fossa - Fib head 10 38 ?44 1.6         Pop fossa - Ankle      R Tibial - AH     Ankle AH 3.7 ?5.8 15.5 ?4.0 100 Ankle - AH 9   32.7     Pop fossa AH 15.0  10.9  70.3 Pop fossa - Ankle 39 35 ?41 26.0  R Ulnar - FDI     Wrist FDI 4.2 ?4.5 6.1 ?7.0 100 Wrist - FDI 8   12.0     B.Elbow FDI 9.1  6.1  100 B.Elbow - Wrist 21 43 ?49 12.6     A.Elbow FDI 11.3  6.5  105 A.Elbow - B.Elbow 10 46 ?49 13.9         A.Elbow - Wrist                    SNC    Nerve / Sites Rec. Site Peak Lat Ref.  Amp Ref. Segments Distance    ms ms V V  cm  R Radial - Anatomical snuff box (Forearm)     Forearm Wrist 2.8 ?2.9 12 ?15 Forearm - Wrist 10  R Sural - Ankle (Calf)     Calf Ankle 3.4 ?4.4 6 ?6 Calf - Ankle 14  R Superficial peroneal - Ankle     Lat leg Ankle NR ?4.4 NR ?6 Lat leg - Ankle 14  R Median - Orthodromic (Dig II, Mid palm)     Dig II Wrist 3.3 ?3.4 9 ?10 Dig II - Wrist 13  R Ulnar - Orthodromic, (Dig V, Mid palm)     Dig V Wrist NR ?3.1 NR ?5 Dig V - Wrist 11               F  Wave    Nerve F Lat Ref.   ms ms  R Tibial - AH 60.5 ?56.0  R Ulnar - ADM 33.5 ?32.0         EMG full       EMG Summary Table    Spontaneous MUAP Recruitment  Muscle IA Fib PSW Fasc Other Amp Dur. Poly Pattern  R. Deltoid Normal None None None _______ Normal Normal Normal Normal  R. Triceps brachii Normal None None None _______ Normal Normal Normal Normal  R. Pronator teres Normal None None None _______ Normal Normal Normal Normal  R. First dorsal interosseous Normal 2+ 3+ None _______ Normal Normal Normal Reduced  R. Opponens pollicis Normal None None None _______ Normal Normal Normal Normal  R. Flexor digitorum profundus (Ulnar) Decreased None None None _______ Normal Normal Normal Reduced      

## 2019-01-20 NOTE — Telephone Encounter (Signed)
lvm for pt to call back about scheduling  [employer] Auth: NPR Ref # Channel on 01/19/19

## 2019-01-22 ENCOUNTER — Ambulatory Visit (INDEPENDENT_AMBULATORY_CARE_PROVIDER_SITE_OTHER): Payer: BLUE CROSS/BLUE SHIELD | Admitting: Surgery

## 2019-01-22 ENCOUNTER — Encounter (INDEPENDENT_AMBULATORY_CARE_PROVIDER_SITE_OTHER): Payer: Self-pay | Admitting: Surgery

## 2019-01-22 ENCOUNTER — Ambulatory Visit (INDEPENDENT_AMBULATORY_CARE_PROVIDER_SITE_OTHER): Payer: BLUE CROSS/BLUE SHIELD

## 2019-01-22 VITALS — BP 129/82 | HR 77 | Ht 70.5 in | Wt 208.0 lb

## 2019-01-22 DIAGNOSIS — G5621 Lesion of ulnar nerve, right upper limb: Secondary | ICD-10-CM | POA: Diagnosis not present

## 2019-01-22 DIAGNOSIS — M25511 Pain in right shoulder: Secondary | ICD-10-CM | POA: Diagnosis not present
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
## 2019-01-23 NOTE — Telephone Encounter (Signed)
x2 lvm for pt to call back about scheduling mri  

## 2019-01-26 ENCOUNTER — Other Ambulatory Visit (INDEPENDENT_AMBULATORY_CARE_PROVIDER_SITE_OTHER): Payer: Self-pay | Admitting: Specialist

## 2019-01-26 NOTE — Telephone Encounter (Signed)
Patient called requesting a refill of Hydrocodone.  Please call patient to advise.  [phone]

## 2019-01-26 NOTE — Telephone Encounter (Signed)
Sent request to Dr. Nitka 

## 2019-01-27 ENCOUNTER — Telehealth: Payer: Self-pay | Admitting: Neurology

## 2019-01-27 ENCOUNTER — Other Ambulatory Visit (INDEPENDENT_AMBULATORY_CARE_PROVIDER_SITE_OTHER): Payer: Self-pay | Admitting: Specialist

## 2019-01-27 NOTE — Telephone Encounter (Signed)
Patient called needing Rx refilled (Hydrocodone) The number to contact patient is [phone]

## 2019-01-27 NOTE — Telephone Encounter (Signed)
Patient returned my call he is scheduled for 01/28/19 at [facility].

## 2019-01-27 NOTE — Telephone Encounter (Signed)
Sent request to Dr. Nitka 

## 2019-01-28 ENCOUNTER — Other Ambulatory Visit: Payer: BLUE CROSS/BLUE SHIELD

## 2019-01-28 MED ORDER — HYDROCODONE-ACETAMINOPHEN 5-325 MG PO TABS: 1.0000 | ORAL_TABLET | Freq: Four times a day (QID) | ORAL | 0 refills | Status: DC | PRN

## 2019-01-29 NOTE — Telephone Encounter (Signed)
FYI---Patient called advsied that he saw Dr. Vear Clock on Monday, and that he will not rx pain meds until he sees him again at the end of the month. They are needing to get his records from, [facility], [place] and [place]. Dr. Vear Clock did have him to stop the Celebrex.

## 2019-02-04 ENCOUNTER — Ambulatory Visit (INDEPENDENT_AMBULATORY_CARE_PROVIDER_SITE_OTHER): Payer: BLUE CROSS/BLUE SHIELD

## 2019-02-04 ENCOUNTER — Other Ambulatory Visit: Payer: Self-pay

## 2019-02-04 DIAGNOSIS — G621 Alcoholic polyneuropathy: Secondary | ICD-10-CM | POA: Diagnosis not present

## 2019-02-04 DIAGNOSIS — R202 Paresthesia of skin: Secondary | ICD-10-CM

## 2019-02-04 DIAGNOSIS — G959 Disease of spinal cord, unspecified: Secondary | ICD-10-CM | POA: Diagnosis not present

## 2019-02-04 DIAGNOSIS — M4712 Other spondylosis with myelopathy, cervical region: Secondary | ICD-10-CM | POA: Diagnosis not present

## 2019-02-04 DIAGNOSIS — R200 Anesthesia of skin: Secondary | ICD-10-CM

## 2019-02-04 DIAGNOSIS — M4722 Other spondylosis with radiculopathy, cervical region: Secondary | ICD-10-CM

## 2019-02-05 ENCOUNTER — Other Ambulatory Visit (INDEPENDENT_AMBULATORY_CARE_PROVIDER_SITE_OTHER): Payer: Self-pay | Admitting: Specialist

## 2019-02-05 ENCOUNTER — Other Ambulatory Visit (INDEPENDENT_AMBULATORY_CARE_PROVIDER_SITE_OTHER): Payer: Self-pay | Admitting: Radiology

## 2019-02-05 ENCOUNTER — Telehealth (INDEPENDENT_AMBULATORY_CARE_PROVIDER_SITE_OTHER): Payer: Self-pay | Admitting: Radiology

## 2019-02-05 MED ORDER — HYDROCODONE-ACETAMINOPHEN 5-325 MG PO TABS: 1.0000 | ORAL_TABLET | Freq: Four times a day (QID) | ORAL | 0 refills | Status: DC | PRN

## 2019-02-05 NOTE — Telephone Encounter (Signed)
error 

## 2019-02-05 NOTE — Telephone Encounter (Signed)
Michael [name] is calling to check the status of his surgery for his elbow, he states that he has not heard anything. Please call him to advised

## 2019-02-06 NOTE — Telephone Encounter (Signed)
I called and scheduled patient.  

## 2019-02-12 ENCOUNTER — Other Ambulatory Visit (INDEPENDENT_AMBULATORY_CARE_PROVIDER_SITE_OTHER): Payer: Self-pay | Admitting: Specialist

## 2019-02-12 MED ORDER — HYDROCODONE-ACETAMINOPHEN 5-325 MG PO TABS: 1.0000 | ORAL_TABLET | Freq: Three times a day (TID) | ORAL | 0 refills | Status: DC | PRN

## 2019-02-12 NOTE — Telephone Encounter (Signed)
Pt came into the office said he needs a refill of Hydrocodone 5-325 and please have that sent to [facility] on [address] or [employer]

## 2019-02-18 ENCOUNTER — Other Ambulatory Visit (INDEPENDENT_AMBULATORY_CARE_PROVIDER_SITE_OTHER): Payer: Self-pay | Admitting: Specialist

## 2019-02-19 ENCOUNTER — Other Ambulatory Visit (INDEPENDENT_AMBULATORY_CARE_PROVIDER_SITE_OTHER): Payer: Self-pay | Admitting: Specialist

## 2019-02-19 MED ORDER — HYDROCODONE-ACETAMINOPHEN 5-325 MG PO TABS: 1.0000 | ORAL_TABLET | Freq: Three times a day (TID) | ORAL | 0 refills | Status: DC | PRN

## 2019-02-19 NOTE — Telephone Encounter (Signed)
JN pt 

## 2019-02-21 ENCOUNTER — Other Ambulatory Visit (INDEPENDENT_AMBULATORY_CARE_PROVIDER_SITE_OTHER): Payer: Self-pay | Admitting: Surgery

## 2019-02-22 NOTE — Telephone Encounter (Signed)
Rx request 

## 2019-02-24 NOTE — Telephone Encounter (Signed)
Pain management was to occur on 3/2, no further narcotics without knowing status of this referral. jen

## 2019-02-25 ENCOUNTER — Ambulatory Visit (INDEPENDENT_AMBULATORY_CARE_PROVIDER_SITE_OTHER): Payer: BLUE CROSS/BLUE SHIELD | Admitting: Surgery

## 2019-02-25 NOTE — Telephone Encounter (Signed)
Tried calling patient to discuss. No answer.  

## 2019-02-26 ENCOUNTER — Other Ambulatory Visit (INDEPENDENT_AMBULATORY_CARE_PROVIDER_SITE_OTHER): Payer: Self-pay | Admitting: Specialist

## 2019-02-26 NOTE — Telephone Encounter (Signed)
lmom for pt to call us back

## 2019-02-26 NOTE — Telephone Encounter (Signed)
Nitka patient 

## 2019-02-27 ENCOUNTER — Encounter (INDEPENDENT_AMBULATORY_CARE_PROVIDER_SITE_OTHER): Payer: Self-pay | Admitting: Specialist

## 2019-02-27 MED ORDER — HYDROCODONE-ACETAMINOPHEN 5-325 MG PO TABS: 1.0000 | ORAL_TABLET | Freq: Three times a day (TID) | ORAL | 0 refills | Status: DC | PRN

## 2019-02-27 NOTE — Telephone Encounter (Signed)
norco refill request---he has not been able to r/s appt from end of march, due to COVID-19

## 2019-02-27 NOTE — Telephone Encounter (Signed)
Rx for refill given and sent to his pharmacy. jen

## 2019-02-27 NOTE — Telephone Encounter (Signed)
Patient called back, states that his appt for end of March was cancelled dur to the COVID-19 pandemic.  They have not rescheduled yet. And he also wanted to let Dr. Otelia Sergeant know that the inside of his hand is going numb.

## 2019-02-27 NOTE — Telephone Encounter (Signed)
Patient is aware his meds were sent to his pharmacy

## 2019-03-06 ENCOUNTER — Other Ambulatory Visit (INDEPENDENT_AMBULATORY_CARE_PROVIDER_SITE_OTHER): Payer: Self-pay | Admitting: Specialist

## 2019-03-06 NOTE — Telephone Encounter (Signed)
Rx refill

## 2019-03-07 ENCOUNTER — Inpatient Hospital Stay (HOSPITAL_COMMUNITY): Payer: BLUE CROSS/BLUE SHIELD

## 2019-03-07 ENCOUNTER — Inpatient Hospital Stay (HOSPITAL_COMMUNITY)
Admission: EM | Admit: 2019-03-07 | Discharge: 2019-03-11 | DRG: 637 | Disposition: A | Discharge status: Home / Self Care | Payer: BLUE CROSS/BLUE SHIELD | Attending: Family Medicine | Admitting: Family Medicine

## 2019-03-07 ENCOUNTER — Other Ambulatory Visit: Payer: Self-pay

## 2019-03-07 ENCOUNTER — Encounter (HOSPITAL_COMMUNITY): Payer: Self-pay | Admitting: Emergency Medicine

## 2019-03-07 DIAGNOSIS — E119 Type 2 diabetes mellitus without complications: Secondary | ICD-10-CM

## 2019-03-07 DIAGNOSIS — G9341 Metabolic encephalopathy: Secondary | ICD-10-CM | POA: Diagnosis present

## 2019-03-07 DIAGNOSIS — F17200 Nicotine dependence, unspecified, uncomplicated: Secondary | ICD-10-CM

## 2019-03-07 DIAGNOSIS — E860 Dehydration: Secondary | ICD-10-CM | POA: Diagnosis present

## 2019-03-07 DIAGNOSIS — G4700 Insomnia, unspecified: Secondary | ICD-10-CM | POA: Diagnosis present

## 2019-03-07 DIAGNOSIS — Z8349 Family history of other endocrine, nutritional and metabolic diseases: Secondary | ICD-10-CM

## 2019-03-07 DIAGNOSIS — J45909 Unspecified asthma, uncomplicated: Secondary | ICD-10-CM | POA: Diagnosis present

## 2019-03-07 DIAGNOSIS — Z811 Family history of alcohol abuse and dependence: Secondary | ICD-10-CM | POA: Diagnosis not present

## 2019-03-07 DIAGNOSIS — K5900 Constipation, unspecified: Secondary | ICD-10-CM | POA: Diagnosis present

## 2019-03-07 DIAGNOSIS — Z833 Family history of diabetes mellitus: Secondary | ICD-10-CM | POA: Diagnosis not present

## 2019-03-07 DIAGNOSIS — Z8261 Family history of arthritis: Secondary | ICD-10-CM

## 2019-03-07 DIAGNOSIS — Z8249 Family history of ischemic heart disease and other diseases of the circulatory system: Secondary | ICD-10-CM

## 2019-03-07 DIAGNOSIS — F17210 Nicotine dependence, cigarettes, uncomplicated: Secondary | ICD-10-CM | POA: Diagnosis present

## 2019-03-07 DIAGNOSIS — L732 Hidradenitis suppurativa: Secondary | ICD-10-CM | POA: Diagnosis not present

## 2019-03-07 DIAGNOSIS — F411 Generalized anxiety disorder: Secondary | ICD-10-CM | POA: Diagnosis not present

## 2019-03-07 DIAGNOSIS — R059 Cough, unspecified: Secondary | ICD-10-CM

## 2019-03-07 DIAGNOSIS — F419 Anxiety disorder, unspecified: Secondary | ICD-10-CM | POA: Diagnosis present

## 2019-03-07 DIAGNOSIS — K7030 Alcoholic cirrhosis of liver without ascites: Secondary | ICD-10-CM | POA: Diagnosis not present

## 2019-03-07 DIAGNOSIS — E46 Unspecified protein-calorie malnutrition: Secondary | ICD-10-CM | POA: Diagnosis present

## 2019-03-07 DIAGNOSIS — Z79899 Other long term (current) drug therapy: Secondary | ICD-10-CM

## 2019-03-07 DIAGNOSIS — E1165 Type 2 diabetes mellitus with hyperglycemia: Secondary | ICD-10-CM | POA: Diagnosis not present

## 2019-03-07 DIAGNOSIS — R05 Cough: Secondary | ICD-10-CM | POA: Diagnosis not present

## 2019-03-07 DIAGNOSIS — E871 Hypo-osmolality and hyponatremia: Secondary | ICD-10-CM | POA: Diagnosis present

## 2019-03-07 DIAGNOSIS — G621 Alcoholic polyneuropathy: Secondary | ICD-10-CM | POA: Diagnosis present

## 2019-03-07 DIAGNOSIS — E1100 Type 2 diabetes mellitus with hyperosmolarity without nonketotic hyperglycemic-hyperosmolar coma (NKHHC): Secondary | ICD-10-CM

## 2019-03-07 DIAGNOSIS — G8929 Other chronic pain: Secondary | ICD-10-CM | POA: Diagnosis present

## 2019-03-07 DIAGNOSIS — F988 Other specified behavioral and emotional disorders with onset usually occurring in childhood and adolescence: Secondary | ICD-10-CM | POA: Diagnosis present

## 2019-03-07 DIAGNOSIS — Z91048 Other nonmedicinal substance allergy status: Secondary | ICD-10-CM

## 2019-03-07 DIAGNOSIS — K3189 Other diseases of stomach and duodenum: Secondary | ICD-10-CM | POA: Diagnosis present

## 2019-03-07 DIAGNOSIS — I10 Essential (primary) hypertension: Secondary | ICD-10-CM | POA: Diagnosis present

## 2019-03-07 DIAGNOSIS — N179 Acute kidney failure, unspecified: Secondary | ICD-10-CM | POA: Diagnosis present

## 2019-03-07 DIAGNOSIS — R7989 Other specified abnormal findings of blood chemistry: Secondary | ICD-10-CM

## 2019-03-07 DIAGNOSIS — F1011 Alcohol abuse, in remission: Secondary | ICD-10-CM

## 2019-03-07 DIAGNOSIS — Z981 Arthrodesis status: Secondary | ICD-10-CM

## 2019-03-07 DIAGNOSIS — Z880 Allergy status to penicillin: Secondary | ICD-10-CM | POA: Diagnosis not present

## 2019-03-07 DIAGNOSIS — Z91040 Latex allergy status: Secondary | ICD-10-CM

## 2019-03-07 DIAGNOSIS — K766 Portal hypertension: Secondary | ICD-10-CM | POA: Diagnosis present

## 2019-03-07 DIAGNOSIS — M1990 Unspecified osteoarthritis, unspecified site: Secondary | ICD-10-CM | POA: Diagnosis present

## 2019-03-07 DIAGNOSIS — M5416 Radiculopathy, lumbar region: Secondary | ICD-10-CM | POA: Diagnosis present

## 2019-03-07 DIAGNOSIS — E1110 Type 2 diabetes mellitus with ketoacidosis without coma: Secondary | ICD-10-CM | POA: Diagnosis present

## 2019-03-07 DIAGNOSIS — K769 Liver disease, unspecified: Secondary | ICD-10-CM | POA: Diagnosis present

## 2019-03-07 DIAGNOSIS — K219 Gastro-esophageal reflux disease without esophagitis: Secondary | ICD-10-CM | POA: Diagnosis present

## 2019-03-07 DIAGNOSIS — I864 Gastric varices: Secondary | ICD-10-CM | POA: Diagnosis present

## 2019-03-07 DIAGNOSIS — R4182 Altered mental status, unspecified: Secondary | ICD-10-CM | POA: Diagnosis present

## 2019-03-07 DIAGNOSIS — G4733 Obstructive sleep apnea (adult) (pediatric): Secondary | ICD-10-CM | POA: Diagnosis present

## 2019-03-07 DIAGNOSIS — Z9119 Patient's noncompliance with other medical treatment and regimen: Secondary | ICD-10-CM

## 2019-03-07 LAB — BASIC METABOLIC PANEL
Anion gap: 16 — ABNORMAL HIGH (ref 5–15)
BUN: 29 mg/dL — ABNORMAL HIGH (ref 6–20)
CO2: 22 mmol/L (ref 22–32)
Calcium: 9.4 mg/dL (ref 8.9–10.3)
Chloride: 87 mmol/L — ABNORMAL LOW (ref 98–111)
Creatinine, Ser: 1.5 mg/dL — ABNORMAL HIGH (ref 0.61–1.24)
GFR calc Af Amer: >60 mL/min (ref >60)
GFR calc non Af Amer: 53 mL/min — ABNORMAL LOW (ref >60)
Glucose, Bld: 537 mg/dL (ref 70–99)
Potassium: 3 mmol/L — ABNORMAL LOW (ref 3.5–5.1)
Sodium: 125 mmol/L — ABNORMAL LOW (ref 135–145)

## 2019-03-07 LAB — URINALYSIS, ROUTINE W REFLEX MICROSCOPIC
Bacteria, UA: NONE SEEN
Bilirubin Urine: NEGATIVE
Glucose, UA: >=500 mg/dL — AB
Hgb urine dipstick: NEGATIVE
Ketones, ur: NEGATIVE mg/dL
Leukocytes,Ua: NEGATIVE
Nitrite: NEGATIVE
Protein, ur: NEGATIVE mg/dL
Specific Gravity, Urine: 1.025 (ref 1.005–1.030)
pH: 7 (ref 5.0–8.0)

## 2019-03-07 LAB — POCT I-STAT 7, (LYTES, BLD GAS, ICA,H+H)
Acid-Base Excess: 1 mmol/L (ref 0.0–2.0)
Bicarbonate: 24.8 mmol/L (ref 20.0–28.0)
Calcium, Ion: 1.18 mmol/L (ref 1.15–1.40)
HCT: 34 % — ABNORMAL LOW (ref 39.0–52.0)
Hemoglobin: 11.6 g/dL — ABNORMAL LOW (ref 13.0–17.0)
O2 Saturation: 99 %
Patient temperature: 98.6
Potassium: 3.6 mmol/L (ref 3.5–5.1)
Sodium: 120 mmol/L — ABNORMAL LOW (ref 135–145)
TCO2: 26 mmol/L (ref 22–32)
pCO2 arterial: 34.5 mmHg (ref 32.0–48.0)
pH, Arterial: 7.464 — ABNORMAL HIGH (ref 7.350–7.450)
pO2, Arterial: 115 mmHg — ABNORMAL HIGH (ref 83.0–108.0)

## 2019-03-07 LAB — CBG MONITORING, ED
Glucose-Capillary: >600 mg/dL (ref 70–99)
Glucose-Capillary: >600 mg/dL (ref 70–99)
Glucose-Capillary: >600 mg/dL (ref 70–99)

## 2019-03-07 LAB — COMPREHENSIVE METABOLIC PANEL
ALT: 32 U/L (ref 0–44)
AST: 37 U/L (ref 15–41)
Albumin: 3.3 g/dL — ABNORMAL LOW (ref 3.5–5.0)
Alkaline Phosphatase: 133 U/L — ABNORMAL HIGH (ref 38–126)
Anion gap: 21 — ABNORMAL HIGH (ref 5–15)
BUN: 30 mg/dL — ABNORMAL HIGH (ref 6–20)
CO2: 20 mmol/L — ABNORMAL LOW (ref 22–32)
Calcium: 9.4 mg/dL (ref 8.9–10.3)
Chloride: 69 mmol/L — ABNORMAL LOW (ref 98–111)
Creatinine, Ser: 1.9 mg/dL — ABNORMAL HIGH (ref 0.61–1.24)
GFR calc Af Amer: 46 mL/min — ABNORMAL LOW (ref >60)
GFR calc non Af Amer: 40 mL/min — ABNORMAL LOW (ref >60)
Glucose, Bld: 1143 mg/dL (ref 70–99)
Potassium: 4.5 mmol/L (ref 3.5–5.1)
Sodium: 110 mmol/L — CL (ref 135–145)
Total Bilirubin: 2.1 mg/dL — ABNORMAL HIGH (ref 0.3–1.2)
Total Protein: 7.6 g/dL (ref 6.5–8.1)

## 2019-03-07 LAB — CBC WITH DIFFERENTIAL/PLATELET
Abs Immature Granulocytes: 0.1 10*3/uL — ABNORMAL HIGH (ref 0.00–0.07)
Basophils Absolute: 0 10*3/uL (ref 0.0–0.1)
Basophils Relative: 0 %
Eosinophils Absolute: 0.2 10*3/uL (ref 0.0–0.5)
Eosinophils Relative: 1 %
HCT: 36.9 % — ABNORMAL LOW (ref 39.0–52.0)
Hemoglobin: 12.7 g/dL — ABNORMAL LOW (ref 13.0–17.0)
Immature Granulocytes: 1 %
Lymphocytes Relative: 11 %
Lymphs Abs: 1.3 10*3/uL (ref 0.7–4.0)
MCH: 31.4 pg (ref 26.0–34.0)
MCHC: 34.4 g/dL (ref 30.0–36.0)
MCV: 91.1 fL (ref 80.0–100.0)
Monocytes Absolute: 1.1 10*3/uL — ABNORMAL HIGH (ref 0.1–1.0)
Monocytes Relative: 9 %
Neutro Abs: 9.1 10*3/uL — ABNORMAL HIGH (ref 1.7–7.7)
Neutrophils Relative %: 78 %
Platelets: 143 10*3/uL — ABNORMAL LOW (ref 150–400)
RBC: 4.05 MIL/uL — ABNORMAL LOW (ref 4.22–5.81)
RDW: 13 % (ref 11.5–15.5)
WBC: 11.8 10*3/uL — ABNORMAL HIGH (ref 4.0–10.5)
nRBC: 0 % (ref 0.0–0.2)

## 2019-03-07 LAB — ETHANOL: Alcohol, Ethyl (B): <10 mg/dL (ref <10)

## 2019-03-07 LAB — GLUCOSE, CAPILLARY
Glucose-Capillary: 317 mg/dL — ABNORMAL HIGH (ref 70–99)
Glucose-Capillary: 401 mg/dL — ABNORMAL HIGH (ref 70–99)
Glucose-Capillary: 515 mg/dL (ref 70–99)
Glucose-Capillary: >600 mg/dL (ref 70–99)

## 2019-03-07 LAB — AMMONIA: Ammonia: 94 umol/L — ABNORMAL HIGH (ref 9–35)

## 2019-03-07 LAB — LIPASE, BLOOD: Lipase: 70 U/L — ABNORMAL HIGH (ref 11–51)

## 2019-03-07 LAB — TROPONIN I: Troponin I: <0.03 ng/mL (ref <0.03)

## 2019-03-07 MED ORDER — SODIUM CHLORIDE 0.9 % IV SOLN
INTRAVENOUS | Status: AC
  Administered 2019-03-07: 21:00:00 via INTRAVENOUS

## 2019-03-07 MED ORDER — SODIUM CHLORIDE 0.9 % IV BOLUS
1000.0000 mL | Freq: Once | INTRAVENOUS | Status: AC
  Administered 2019-03-07: 18:00:00 1000 mL via INTRAVENOUS

## 2019-03-07 MED ORDER — ENOXAPARIN SODIUM 40 MG/0.4ML ~~LOC~~ SOLN
40.0000 mg | SUBCUTANEOUS | Status: DC
  Administered 2019-03-07 – 2019-03-10 (×4): 40 mg via SUBCUTANEOUS
  Filled 2019-03-07 (×4): qty 0.4

## 2019-03-07 MED ORDER — POTASSIUM CHLORIDE CRYS ER 20 MEQ PO TBCR
40.0000 meq | EXTENDED_RELEASE_TABLET | ORAL | Status: AC
  Administered 2019-03-07 – 2019-03-08 (×3): 40 meq via ORAL
  Filled 2019-03-07 (×3): qty 2

## 2019-03-07 MED ORDER — KETOROLAC TROMETHAMINE 30 MG/ML IJ SOLN
30.0000 mg | Freq: Once | INTRAMUSCULAR | Status: AC
  Administered 2019-03-07: 16:00:00 30 mg via INTRAVENOUS
  Filled 2019-03-07: qty 1

## 2019-03-07 MED ORDER — ACETAMINOPHEN 650 MG RE SUPP: 650.0000 mg | Freq: Four times a day (QID) | RECTAL | Status: DC | PRN

## 2019-03-07 MED ORDER — ONDANSETRON HCL 4 MG PO TABS
4.0000 mg | ORAL_TABLET | Freq: Four times a day (QID) | ORAL | Status: DC | PRN
  Administered 2019-03-10: 4 mg via ORAL
  Filled 2019-03-07: qty 1

## 2019-03-07 MED ORDER — ACETAMINOPHEN 325 MG PO TABS: 650.0000 mg | ORAL_TABLET | Freq: Four times a day (QID) | ORAL | Status: DC | PRN

## 2019-03-07 MED ORDER — SODIUM CHLORIDE 0.9 % IV SOLN
INTRAVENOUS | Status: DC
  Administered 2019-03-07 – 2019-03-08 (×2): via INTRAVENOUS

## 2019-03-07 MED ORDER — INSULIN REGULAR(HUMAN) IN NACL 100-0.9 UT/100ML-% IV SOLN
INTRAVENOUS | Status: DC
  Administered 2019-03-07: 18:00:00 5.4 [IU]/h via INTRAVENOUS
  Filled 2019-03-07 (×2): qty 100

## 2019-03-07 MED ORDER — SODIUM CHLORIDE 0.9 % IV SOLN: INTRAVENOUS | Status: DC

## 2019-03-07 MED ORDER — POTASSIUM CHLORIDE 10 MEQ/100ML IV SOLN
10.0000 meq | INTRAVENOUS | Status: AC
  Administered 2019-03-07: 10 meq via INTRAVENOUS

## 2019-03-07 MED ORDER — ONDANSETRON HCL 4 MG/2ML IJ SOLN
4.0000 mg | Freq: Once | INTRAMUSCULAR | Status: AC
  Administered 2019-03-07: 4 mg via INTRAVENOUS
  Filled 2019-03-07: qty 2

## 2019-03-07 MED ORDER — ONDANSETRON HCL 4 MG/2ML IJ SOLN
4.0000 mg | Freq: Four times a day (QID) | INTRAMUSCULAR | Status: DC | PRN
  Administered 2019-03-11: 06:00:00 4 mg via INTRAVENOUS
  Filled 2019-03-07: qty 2

## 2019-03-07 MED ORDER — SODIUM CHLORIDE 0.9 % IV BOLUS
1000.0000 mL | Freq: Once | INTRAVENOUS | Status: AC
  Administered 2019-03-07: 15:00:00 1000 mL via INTRAVENOUS

## 2019-03-07 MED ORDER — NICOTINE 7 MG/24HR TD PT24
7.0000 mg | MEDICATED_PATCH | Freq: Every day | TRANSDERMAL | Status: DC
  Administered 2019-03-07 – 2019-03-11 (×5): 7 mg via TRANSDERMAL
  Filled 2019-03-07 (×5): qty 1

## 2019-03-07 MED ORDER — POTASSIUM CHLORIDE 10 MEQ/100ML IV SOLN
10.0000 meq | INTRAVENOUS | Status: AC
  Administered 2019-03-07: 21:00:00 10 meq via INTRAVENOUS
  Filled 2019-03-07: qty 100

## 2019-03-07 MED ORDER — INSULIN REGULAR(HUMAN) IN NACL 100-0.9 UT/100ML-% IV SOLN: INTRAVENOUS | Status: DC

## 2019-03-07 MED ORDER — DEXTROSE-NACL 5-0.45 % IV SOLN
INTRAVENOUS | Status: DC
  Administered 2019-03-08 (×2): via INTRAVENOUS
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
## 2019-03-07 NOTE — ED Triage Notes (Signed)
Patient has been feeling weak, dizzy, dry mouth for about a week. He also says he decreased sensation in his feet. He denies fever and SOB. He says he has vomited once in the past 24 hours.

## 2019-03-07 NOTE — Progress Notes (Signed)
Patient arrived to the floor, BG is > 600, BP is 87/60, pt ambulate to the bed
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
## 2019-03-07 NOTE — Progress Notes (Signed)
Family medicine provider called to check on PT last BP after the bolus, which was 92/62 manually. Pt already has one run of potassium, the second on is still runnung. Pt has also received 40 Meq potassium PO. The last BG reading is 401. Received order to hold insulin drip for now. untl potassium get stable.

## 2019-03-07 NOTE — Progress Notes (Signed)
Patient's K has dropped from 4.5 to 3.0 despite some potassium supplementation IV.  RN paged and says patient can take meds PO so kdur ordered.  We are checking BMP Q4hr and will hold insulin until K>3.3.  CBGs have improved from 1143 to 537.  -Dr. Parke Simmers

## 2019-03-08 ENCOUNTER — Encounter (HOSPITAL_COMMUNITY): Payer: Self-pay

## 2019-03-08 DIAGNOSIS — E1165 Type 2 diabetes mellitus with hyperglycemia: Secondary | ICD-10-CM

## 2019-03-08 DIAGNOSIS — I10 Essential (primary) hypertension: Secondary | ICD-10-CM

## 2019-03-08 DIAGNOSIS — R05 Cough: Secondary | ICD-10-CM

## 2019-03-08 DIAGNOSIS — E1100 Type 2 diabetes mellitus with hyperosmolarity without nonketotic hyperglycemic-hyperosmolar coma (NKHHC): Secondary | ICD-10-CM

## 2019-03-08 DIAGNOSIS — R059 Cough, unspecified: Secondary | ICD-10-CM

## 2019-03-08 DIAGNOSIS — F17200 Nicotine dependence, unspecified, uncomplicated: Secondary | ICD-10-CM

## 2019-03-08 DIAGNOSIS — F1011 Alcohol abuse, in remission: Secondary | ICD-10-CM

## 2019-03-08 DIAGNOSIS — E119 Type 2 diabetes mellitus without complications: Secondary | ICD-10-CM

## 2019-03-08 DIAGNOSIS — R7989 Other specified abnormal findings of blood chemistry: Secondary | ICD-10-CM

## 2019-03-08 DIAGNOSIS — F411 Generalized anxiety disorder: Secondary | ICD-10-CM

## 2019-03-08 LAB — GLUCOSE, CAPILLARY
Glucose-Capillary: 123 mg/dL — ABNORMAL HIGH (ref 70–99)
Glucose-Capillary: 144 mg/dL — ABNORMAL HIGH (ref 70–99)
Glucose-Capillary: 154 mg/dL — ABNORMAL HIGH (ref 70–99)
Glucose-Capillary: 156 mg/dL — ABNORMAL HIGH (ref 70–99)
Glucose-Capillary: 170 mg/dL — ABNORMAL HIGH (ref 70–99)
Glucose-Capillary: 214 mg/dL — ABNORMAL HIGH (ref 70–99)
Glucose-Capillary: 273 mg/dL — ABNORMAL HIGH (ref 70–99)
Glucose-Capillary: 311 mg/dL — ABNORMAL HIGH (ref 70–99)
Glucose-Capillary: 322 mg/dL — ABNORMAL HIGH (ref 70–99)
Glucose-Capillary: 360 mg/dL — ABNORMAL HIGH (ref 70–99)
Glucose-Capillary: 364 mg/dL — ABNORMAL HIGH (ref 70–99)
Glucose-Capillary: 518 mg/dL (ref 70–99)

## 2019-03-08 LAB — BASIC METABOLIC PANEL WITH GFR
Anion gap: 16 — ABNORMAL HIGH (ref 5–15)
Anion gap: 12 (ref 5–15)
Anion gap: 9 (ref 5–15)
BUN: 28 mg/dL — ABNORMAL HIGH (ref 6–20)
BUN: 28 mg/dL — ABNORMAL HIGH (ref 6–20)
BUN: 25 mg/dL — ABNORMAL HIGH (ref 6–20)
CO2: 22 mmol/L (ref 22–32)
CO2: 21 mmol/L — ABNORMAL LOW (ref 22–32)
CO2: 21 mmol/L — ABNORMAL LOW (ref 22–32)
Calcium: 9 mg/dL (ref 8.9–10.3)
Calcium: 8.6 mg/dL — ABNORMAL LOW (ref 8.9–10.3)
Calcium: 8.7 mg/dL — ABNORMAL LOW (ref 8.9–10.3)
Chloride: 89 mmol/L — ABNORMAL LOW (ref 98–111)
Chloride: 95 mmol/L — ABNORMAL LOW (ref 98–111)
Chloride: 98 mmol/L (ref 98–111)
Creatinine, Ser: 1.45 mg/dL — ABNORMAL HIGH (ref 0.61–1.24)
Creatinine, Ser: 1.45 mg/dL — ABNORMAL HIGH (ref 0.61–1.24)
Creatinine, Ser: 1.42 mg/dL — ABNORMAL HIGH (ref 0.61–1.24)
GFR calc Af Amer: >60 mL/min
GFR calc Af Amer: >60 mL/min
GFR calc Af Amer: >60 mL/min
GFR calc non Af Amer: 55 mL/min — ABNORMAL LOW
GFR calc non Af Amer: 55 mL/min — ABNORMAL LOW
GFR calc non Af Amer: 56 mL/min — ABNORMAL LOW
Glucose, Bld: 325 mg/dL — ABNORMAL HIGH (ref 70–99)
Glucose, Bld: 279 mg/dL — ABNORMAL HIGH (ref 70–99)
Glucose, Bld: 129 mg/dL — ABNORMAL HIGH (ref 70–99)
Potassium: 3.9 mmol/L (ref 3.5–5.1)
Potassium: 3.6 mmol/L (ref 3.5–5.1)
Potassium: 4.5 mmol/L (ref 3.5–5.1)
Sodium: 127 mmol/L — ABNORMAL LOW (ref 135–145)
Sodium: 128 mmol/L — ABNORMAL LOW (ref 135–145)
Sodium: 128 mmol/L — ABNORMAL LOW (ref 135–145)

## 2019-03-08 LAB — BASIC METABOLIC PANEL
Anion gap: 10 (ref 5–15)
BUN: 26 mg/dL — ABNORMAL HIGH (ref 6–20)
CO2: 18 mmol/L — ABNORMAL LOW (ref 22–32)
Calcium: 8.5 mg/dL — ABNORMAL LOW (ref 8.9–10.3)
Chloride: 92 mmol/L — ABNORMAL LOW (ref 98–111)
Creatinine, Ser: 1.99 mg/dL — ABNORMAL HIGH (ref 0.61–1.24)
GFR calc Af Amer: 43 mL/min — ABNORMAL LOW (ref >60)
GFR calc non Af Amer: 37 mL/min — ABNORMAL LOW (ref >60)
Glucose, Bld: 444 mg/dL — ABNORMAL HIGH (ref 70–99)
Potassium: 5.1 mmol/L (ref 3.5–5.1)
Sodium: 120 mmol/L — ABNORMAL LOW (ref 135–145)

## 2019-03-08 LAB — HIV ANTIBODY (ROUTINE TESTING W REFLEX): HIV Screen 4th Generation wRfx: NONREACTIVE

## 2019-03-08 LAB — AMMONIA: Ammonia: 88 umol/L — ABNORMAL HIGH (ref 9–35)

## 2019-03-08 MED ORDER — GABAPENTIN 300 MG PO CAPS
300.0000 mg | ORAL_CAPSULE | Freq: Three times a day (TID) | ORAL | Status: DC
  Administered 2019-03-08 – 2019-03-11 (×10): 300 mg via ORAL
  Filled 2019-03-08 (×10): qty 1

## 2019-03-08 MED ORDER — INSULIN GLARGINE 100 UNIT/ML ~~LOC~~ SOLN
15.0000 [IU] | Freq: Every day | SUBCUTANEOUS | Status: DC
  Administered 2019-03-08: 15 [IU] via SUBCUTANEOUS
  Filled 2019-03-08 (×3): qty 0.15

## 2019-03-08 MED ORDER — DEXTROSE 50 % IV SOLN: 25.0000 mL | INTRAVENOUS | Status: DC | PRN

## 2019-03-08 MED ORDER — FOLIC ACID 1 MG PO TABS
1.0000 mg | ORAL_TABLET | Freq: Every day | ORAL | Status: DC
  Administered 2019-03-08 – 2019-03-11 (×4): 1 mg via ORAL
  Filled 2019-03-08 (×4): qty 1

## 2019-03-08 MED ORDER — INSULIN GLARGINE 100 UNIT/ML ~~LOC~~ SOLN
10.0000 [IU] | Freq: Once | SUBCUTANEOUS | Status: AC
  Administered 2019-03-08: 10 [IU] via SUBCUTANEOUS
  Filled 2019-03-08: qty 0.1

## 2019-03-08 MED ORDER — SODIUM CHLORIDE 0.9 % IV SOLN: INTRAVENOUS | Status: DC

## 2019-03-08 MED ORDER — VITAMIN B-1 100 MG PO TABS
100.0000 mg | ORAL_TABLET | Freq: Every day | ORAL | Status: DC
  Administered 2019-03-08 – 2019-03-11 (×4): 100 mg via ORAL
  Filled 2019-03-08 (×4): qty 1

## 2019-03-08 MED ORDER — SODIUM CHLORIDE 0.9 % IV BOLUS
1000.0000 mL | Freq: Once | INTRAVENOUS | Status: AC
  Administered 2019-03-08: 1000 mL via INTRAVENOUS

## 2019-03-08 MED ORDER — THIAMINE HCL 100 MG/ML IJ SOLN
100.0000 mg | Freq: Every day | INTRAMUSCULAR | Status: DC
  Filled 2019-03-08: qty 2

## 2019-03-08 MED ORDER — ZOLPIDEM TARTRATE 5 MG PO TABS
5.0000 mg | ORAL_TABLET | Freq: Every evening | ORAL | Status: DC | PRN
  Administered 2019-03-08 – 2019-03-10 (×3): 5 mg via ORAL
  Filled 2019-03-08 (×3): qty 1

## 2019-03-08 MED ORDER — LACTULOSE 10 GM/15ML PO SOLN
10.0000 g | Freq: Every day | ORAL | Status: DC
  Administered 2019-03-08: 23:00:00 10 g via ORAL
  Filled 2019-03-08: qty 15

## 2019-03-08 MED ORDER — LIVING WELL WITH DIABETES BOOK
Freq: Once | Status: AC
  Administered 2019-03-08: 11:00:00

## 2019-03-08 MED ORDER — DEXTROSE-NACL 5-0.45 % IV SOLN: INTRAVENOUS | Status: DC

## 2019-03-08 MED ORDER — INSULIN REGULAR(HUMAN) IN NACL 100-0.9 UT/100ML-% IV SOLN
INTRAVENOUS | Status: DC
  Administered 2019-03-08: 03:00:00 12 [IU]/h via INTRAVENOUS
  Administered 2019-03-08: 3.8 [IU]/h via INTRAVENOUS
  Filled 2019-03-08 (×2): qty 100

## 2019-03-08 MED ORDER — INSULIN STARTER KIT- PEN NEEDLES (ENGLISH): 1.0000 | Freq: Once | Status: DC

## 2019-03-08 MED ORDER — POTASSIUM CHLORIDE CRYS ER 20 MEQ PO TBCR
40.0000 meq | EXTENDED_RELEASE_TABLET | ORAL | Status: AC
  Administered 2019-03-08: 40 meq via ORAL
  Filled 2019-03-08: qty 2

## 2019-03-08 MED ORDER — INSULIN ASPART 100 UNIT/ML ~~LOC~~ SOLN
0.0000 [IU] | Freq: Every day | SUBCUTANEOUS | Status: DC
  Administered 2019-03-09: 3 [IU] via SUBCUTANEOUS
  Administered 2019-03-10: 4 [IU] via SUBCUTANEOUS

## 2019-03-08 MED ORDER — INSULIN ASPART 100 UNIT/ML ~~LOC~~ SOLN
0.0000 [IU] | Freq: Three times a day (TID) | SUBCUTANEOUS | Status: DC
  Administered 2019-03-08: 9 [IU] via SUBCUTANEOUS
  Administered 2019-03-08: 2 [IU] via SUBCUTANEOUS
  Administered 2019-03-09: 5 [IU] via SUBCUTANEOUS
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
## 2019-03-08 NOTE — Plan of Care (Signed)
POC initiated and progressing. 

## 2019-03-08 NOTE — Progress Notes (Addendum)
Inpatient Diabetes Program Recommendations  AACE/ADA: New Consensus Statement on Inpatient Glycemic Control (2015)  Target Ranges:  Prepandial:   less than 140 mg/dL      Peak postprandial:   less than 180 mg/dL (1-2 hours)      Critically ill patients:  140 - 180 mg/dL     Review of Glycemic Control  Diabetes history: New Diabetes Diagnosis  Current orders for Inpatient glycemic control: IV insulin  Inpatient Diabetes Program Recommendations:    Patient requiring 2-4 units/hour IV insulin to keep glucose in target range. Will need at least .2-.3 units/kg for basal insulin dosing.  Glucose 1143 on admission. Currently on IV insulin. A1c 4.9% on 07/24/18. Patient has [employer]. Based on glucose level will anticipate pt require dual therapy (orals and nasal insulin) at time of d/c.  Spoke with patient over the phone. Patient has a family history of DM. Patient has used ETOH in the past but last drank September 27, 2017. Patient reports having a PCP in [facility].   Spoke with patient about new diabetes diagnosis.  Discussed what an A1C is and informed patient was are waiting on his level to result. Discussed basic pathophysiology of DM Type 2, basic home care, importance of checking CBGs and maintaining good CBG control to prevent long-term and short-term complications. Reviewed glucose and A1C goals. Reviewed signs and symptoms of hyperglycemia and hypoglycemia along with treatment for both. Discussed impact of nutrition, exercise, stress, sickness, and medications on diabetes control. Reviewed Living Well with diabetes booklet and encouraged patient to read through entire book. Informed patient that he may be prescribed basal insulin in addition to an oral medication for DM management outpatient. Discussed lifestyle changes exercise and diet changes (portion control, regular meal intake, diet/zero/light version of beverages).  Sent patient insulin pen and checking CBG instructional  videos via email to [email or web address].  Asked patient to check his glucose at least 2 times a day (Fasting and alternating second check) and to keep a log book of glucose readings and insulin taken. Explained how the doctor he follows up with can use the log book to continue to make insulin adjustments if needed.   Will call back 2 pm to review insulin pen administration and glucose check videos and other DM information.  Discussed care with floor RN. RN to get patient to administer insulin injection in addition to checking his glucose before discharge. Patient verbalized understanding of information.  RNs to provide ongoing basic DM education at bedside with this patient and engage patient to actively check blood glucose and administer insulin injections.   ADDENDUM: Spoke with patient over the phone to review educational videos. Patient has not watched them yet but is starting to look at the DM educational booklet. Will call patient tomorrow to review and tie in all information.  Thanks, Christena Deem RN, MSN, BC-ADM Inpatient Diabetes Coordinator Team Pager [phone] (8a-5p)

## 2019-03-08 NOTE — Progress Notes (Signed)
15 units of Lantus given. BS 170. Will monitor and d/c insulin gtt @ 1:00pm.  Lacy Duverney, RN

## 2019-03-08 NOTE — Progress Notes (Signed)
Patient's 0030 BMP came back with a potassium of 3.9.  It is now safe to restart the insulin pump.  Last CBG was 311 so we will continue with the normal saline IV fluid for now, when CBG goes below 250 we will switch to D5 quarter normal saline.  Anion gap is still 16, patient has been alert enough to take oral potassium supplementation up till now.  Continue every 4 hours BMPs and every hour CBG while on insulin pump.  Dr. Parke Simmers

## 2019-03-08 NOTE — Progress Notes (Signed)
Patient with improved mental status the patient is back to baseline.  CBG is now under 2002.  Anion gap is not closed x1.  Patient is progressing on D5 quarter normal saline and insulin pump until anion gap is closed x2.  Now that mental status as regained normal patient is requesting return of his prior pain regimen.  We will start by adding back gabapentin and evaluate mental status after that which point we can discuss potential of his home hydrocodone.  Decision for trazodone and Ambien will be made tonight.  Dr. Parke Simmers

## 2019-03-08 NOTE — Progress Notes (Signed)
FPTS Interim Progress Note  S: On evening chart review, patient with elevated glucose mild hypo-natremia and continued AKI.  Assessed him at bedside, mentating normally and able to give a full history of his medical conditions, which include known alcoholic cirrhosis.  He states his last drink was November 2018.  He does have a hepatologist at [facility].  He denies ever having abdominal swelling or lower extremity swelling.  He also denies having any previous diagnoses of diabetes.  O: BP (!) 86/53 (BP Location: Left Arm) Comment: MD aware no new orders  Pulse 98   Temp 98.7 F (37.1 C) (Oral)   Resp 20   Ht 5\' 10"  (1.778 m)   Wt 83.1 kg   SpO2 94%   BMI 26.29 kg/m   Gen: Patient is alert, awake talkative, pleasant. Resp: breathing comfortably, no increased WOB Abd: nontender, nondistended Ext: no edema, WWP.   A/P: HHS: Patient's blood sugar is climbing.  We will increase his insulin by giving additional 10 units of Lantus now.  Recheck CBG in 2 hours.  We could cover with additional NovoLog then if necessary.  Based on his drip rate at conversion from drip to subcu insulin he could have required up to 30 units of Lantus.  He is mentating normally.  His sodium corrects to 126. Fluid status is dry to euvolemic.  -10 units Lantus -recheck CBG in 2-hour -1L NS bolus -strict I/O, bladder scan if no outs in 4 hours after NS bolus  Alcoholic cirrhosis: Patient with known extensive alcohol use, now sober.  History of esophageal varices status post banding in February 2019. Last GI plan was 11/2017 in [facility]. Home meds are supposed to include propranolol, lactulose, iron. No recent INR for MELD scoring, but if INR was normal his MELD-Na would be 24 based on available labs. Relative hypotension likely related to cirrhosis, but mentation normal.  -INR with morning labs -Repeat CBC for platelet count -encourage patient to f/u with hepatology at d/c  -question whether he would need  midodrine if BPs stay lower -restart lactulose and titrate to 2-3 soft BMs per day  HS: Patient requested changing of his bandages over a couple of his hidradenitis flare locations.  Order placed for such.  Garth Bigness, MD 03/08/2019, 9:43 PM PGY-3, [facility] Family Medicine Service pager [phone]

## 2019-03-08 NOTE — Progress Notes (Signed)
Gluco stabilizer restarted with the insulin drip per MD order. Last BG is 360. Will continue to monitor.

## 2019-03-09 DIAGNOSIS — G4733 Obstructive sleep apnea (adult) (pediatric): Secondary | ICD-10-CM

## 2019-03-09 DIAGNOSIS — L732 Hidradenitis suppurativa: Secondary | ICD-10-CM

## 2019-03-09 DIAGNOSIS — K7030 Alcoholic cirrhosis of liver without ascites: Secondary | ICD-10-CM

## 2019-03-09 LAB — COMPREHENSIVE METABOLIC PANEL
ALT: 34 U/L (ref 0–44)
AST: 57 U/L — ABNORMAL HIGH (ref 15–41)
Albumin: 2.6 g/dL — ABNORMAL LOW (ref 3.5–5.0)
Alkaline Phosphatase: 101 U/L (ref 38–126)
Anion gap: 9 (ref 5–15)
BUN: 30 mg/dL — ABNORMAL HIGH (ref 6–20)
CO2: 19 mmol/L — ABNORMAL LOW (ref 22–32)
Calcium: 8.4 mg/dL — ABNORMAL LOW (ref 8.9–10.3)
Chloride: 94 mmol/L — ABNORMAL LOW (ref 98–111)
Creatinine, Ser: 2.02 mg/dL — ABNORMAL HIGH (ref 0.61–1.24)
GFR calc Af Amer: 43 mL/min — ABNORMAL LOW (ref >60)
GFR calc non Af Amer: 37 mL/min — ABNORMAL LOW (ref >60)
Glucose, Bld: 468 mg/dL — ABNORMAL HIGH (ref 70–99)
Potassium: 5 mmol/L (ref 3.5–5.1)
Sodium: 122 mmol/L — ABNORMAL LOW (ref 135–145)
Total Bilirubin: 1.7 mg/dL — ABNORMAL HIGH (ref 0.3–1.2)
Total Protein: 5.9 g/dL — ABNORMAL LOW (ref 6.5–8.1)

## 2019-03-09 LAB — GLUCOSE, CAPILLARY
Glucose-Capillary: 498 mg/dL — ABNORMAL HIGH (ref 70–99)
Glucose-Capillary: 432 mg/dL — ABNORMAL HIGH (ref 70–99)
Glucose-Capillary: 326 mg/dL — ABNORMAL HIGH (ref 70–99)
Glucose-Capillary: 294 mg/dL — ABNORMAL HIGH (ref 70–99)
Glucose-Capillary: 305 mg/dL — ABNORMAL HIGH (ref 70–99)
Glucose-Capillary: 310 mg/dL — ABNORMAL HIGH (ref 70–99)
Glucose-Capillary: 272 mg/dL — ABNORMAL HIGH (ref 70–99)

## 2019-03-09 LAB — HEMOGLOBIN A1C
Hgb A1c MFr Bld: 15.1 % — ABNORMAL HIGH (ref 4.8–5.6)
Mean Plasma Glucose: 387 mg/dL

## 2019-03-09 LAB — CBC
HCT: 28.2 % — ABNORMAL LOW (ref 39.0–52.0)
Hemoglobin: 10.1 g/dL — ABNORMAL LOW (ref 13.0–17.0)
MCH: 32.2 pg (ref 26.0–34.0)
MCHC: 35.8 g/dL (ref 30.0–36.0)
MCV: 89.8 fL (ref 80.0–100.0)
Platelets: 96 10*3/uL — ABNORMAL LOW (ref 150–400)
RBC: 3.14 MIL/uL — ABNORMAL LOW (ref 4.22–5.81)
RDW: 12.7 % (ref 11.5–15.5)
WBC: 7.2 10*3/uL (ref 4.0–10.5)
nRBC: 0.3 % — ABNORMAL HIGH (ref 0.0–0.2)

## 2019-03-09 LAB — PROTIME-INR
INR: 1.5 — ABNORMAL HIGH (ref 0.8–1.2)
Prothrombin Time: 17.5 seconds — ABNORMAL HIGH (ref 11.4–15.2)

## 2019-03-09 MED ORDER — ALBUTEROL SULFATE (2.5 MG/3ML) 0.083% IN NEBU
2.5000 mg | INHALATION_SOLUTION | RESPIRATORY_TRACT | Status: DC | PRN
  Administered 2019-03-09 – 2019-03-11 (×2): 2.5 mg via RESPIRATORY_TRACT
  Filled 2019-03-09 (×2): qty 3

## 2019-03-09 MED ORDER — SODIUM CHLORIDE 0.9 % IV BOLUS
500.0000 mL | Freq: Once | INTRAVENOUS | Status: AC
  Administered 2019-03-09: 12:00:00 500 mL via INTRAVENOUS

## 2019-03-09 MED ORDER — LORAZEPAM 2 MG/ML IJ SOLN
2.0000 mg | Freq: Once | INTRAMUSCULAR | Status: AC
  Administered 2019-03-09: 19:00:00 2 mg via INTRAVENOUS
  Filled 2019-03-09: qty 1

## 2019-03-09 MED ORDER — LACTULOSE 10 GM/15ML PO SOLN
10.0000 g | Freq: Two times a day (BID) | ORAL | Status: DC
  Administered 2019-03-09 (×2): 10 g via ORAL
  Filled 2019-03-09 (×3): qty 15

## 2019-03-09 MED ORDER — NYSTATIN 100000 UNIT/ML MT SUSP
5.0000 mL | Freq: Four times a day (QID) | OROMUCOSAL | Status: DC
  Administered 2019-03-09 – 2019-03-11 (×7): 500000 [IU] via ORAL
  Filled 2019-03-09 (×7): qty 5

## 2019-03-09 MED ORDER — INSULIN ASPART 100 UNIT/ML ~~LOC~~ SOLN: 4.0000 [IU] | Freq: Once | SUBCUTANEOUS | Status: DC

## 2019-03-09 MED ORDER — INSULIN GLARGINE 100 UNIT/ML ~~LOC~~ SOLN
30.0000 [IU] | Freq: Every day | SUBCUTANEOUS | Status: DC
  Administered 2019-03-09: 30 [IU] via SUBCUTANEOUS
  Filled 2019-03-09 (×3): qty 0.3

## 2019-03-09 MED ORDER — HYDROCODONE-ACETAMINOPHEN 5-325 MG PO TABS: 1.0000 | ORAL_TABLET | Freq: Three times a day (TID) | ORAL | 0 refills | Status: DC | PRN

## 2019-03-09 MED ORDER — INSULIN ASPART 100 UNIT/ML ~~LOC~~ SOLN
0.0000 [IU] | Freq: Three times a day (TID) | SUBCUTANEOUS | Status: DC
  Administered 2019-03-09 (×2): 11 [IU] via SUBCUTANEOUS
  Administered 2019-03-10 (×2): 8 [IU] via SUBCUTANEOUS
  Administered 2019-03-10: 5 [IU] via SUBCUTANEOUS
  Administered 2019-03-11: 11 [IU] via SUBCUTANEOUS
  Administered 2019-03-11: 07:00:00 5 [IU] via SUBCUTANEOUS

## 2019-03-09 MED ORDER — INSULIN ASPART 100 UNIT/ML ~~LOC~~ SOLN
7.0000 [IU] | Freq: Once | SUBCUTANEOUS | Status: AC
  Administered 2019-03-09: 03:00:00 7 [IU] via SUBCUTANEOUS

## 2019-03-09 MED ORDER — DULOXETINE HCL 60 MG PO CPEP
60.0000 mg | ORAL_CAPSULE | Freq: Every day | ORAL | Status: DC
  Administered 2019-03-09 – 2019-03-11 (×3): 60 mg via ORAL
  Filled 2019-03-09 (×3): qty 1

## 2019-03-09 MED ORDER — TRAZODONE HCL 100 MG PO TABS
100.0000 mg | ORAL_TABLET | Freq: Every day | ORAL | Status: DC
  Administered 2019-03-09 – 2019-03-10 (×2): 100 mg via ORAL
  Filled 2019-03-09 (×2): qty 1

## 2019-03-09 NOTE — Progress Notes (Signed)
Pt. Refused cpap. 

## 2019-03-09 NOTE — Discharge Instructions (Addendum)
It has been a pleasure taking care of you! You were admitted due to new onset diabetes. We have treated you with insulin. With that your symptoms improved to the point we think it is safe to let you go home and follow up with your primary care doctor.   We are discharging you on metformin and lantus insulin that you need to continue taking after you leave the hospital. Your primary care doctor will help you increase these medications as needed.  There could be some changes made to your home medications during this hospitalization. Please, make sure to read the directions before you take them. The names and directions on how to take these medications are found on this discharge paper under medication section.   Please call your primary care doctor in the next 1-2 days.    [employer] for Diabetes Products Glucose Products:  ReliOn glucose products raise low blood sugar fast. Tablets are free of fat, caffeine, sodium and gluten. They are portable and easy to carry, making it easier for people with diabetes to BE PREPARED for lows.  Glucose Tablets Available in 6 flavors  10 ct...................................... $1.00  50 ct...................................... $3.98 Glucose Shot..................................$1.48 Glucose Gel....................................$3.44  Alcohol Swabs Alcohol swabs are used to sterilize your injection site. All of our swabs are individually wrapped for maximum safety, convenience and moisture retention. ReliOn Alcohol Swabs  100 ct Swabs..............................$1.00  400 ct Swabs..............................$3.74
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
## 2019-03-09 NOTE — Progress Notes (Addendum)
Inpatient Diabetes Program Recommendations  AACE/ADA: New Consensus Statement on Inpatient Glycemic Control (2015)  Target Ranges:  Prepandial:   less than 140 mg/dL      Peak postprandial:   less than 180 mg/dL (1-2 hours)      Critically ill patients:  140 - 180 mg/dL   Lab Results  Component Value Date   GLUCAP 294 (H) 03/09/2019   HGBA1C 15.1 (H) 03/07/2019    Review of Glycemic Control  A1c 15.1% this admission  Patient received a total of 25 units of basal insulin yesterday. Glucose trends increased through the evening. Fasting glucose above 300 this am. Patient's Lantus was increased to 30 units.   Please consider Lantus 35-40 units based on patient's resistance.  May also consider starting oral agents in the hospital if medically stable in all other areas. Patient may benefit from 2 oral agents for daytime glucose spikes throughout the day.    Spoke with patient over the phone this am. Has not injected himself with insulin or checked his glucose yet. Spoke with Surveyor, mining. Patient to try these tasks for lunch today.   Addendum 1:35 pm:  Spoke with patient over the phone he is interested in the [employer] device at time of d/c. His insurance covers 100% prescriptions. Spoke with patient's Partner, Jenny Reichmann, over the phone in regards to lifestyle management, diet, exercise. Jenny Reichmann is the one who grocery shops and cooks. Questions answered  [facility] [address]  Pt asking about transitions of care pharmacy to get meds before he leaves hospital.  Discharge order numbers: Freestyle Libre 14 day Sensors: (will need 2 sensors) order #[id number] Lantus Solostar pen: Order # [id number] Insulin pen needles: order # [id number] Blood Glucose meter kit: order # [id number]  Thanks,  Tama Headings RN, MSN, BC-ADM Inpatient Diabetes Coordinator Team Pager [phone] (8a-5p)

## 2019-03-10 LAB — BASIC METABOLIC PANEL
Anion gap: 12 (ref 5–15)
BUN: 14 mg/dL (ref 6–20)
CO2: 18 mmol/L — ABNORMAL LOW (ref 22–32)
Calcium: 9 mg/dL (ref 8.9–10.3)
Chloride: 100 mmol/L (ref 98–111)
Creatinine, Ser: 1.01 mg/dL (ref 0.61–1.24)
GFR calc Af Amer: >60 mL/min (ref >60)
GFR calc non Af Amer: >60 mL/min (ref >60)
Glucose, Bld: 290 mg/dL — ABNORMAL HIGH (ref 70–99)
Potassium: 4 mmol/L (ref 3.5–5.1)
Sodium: 130 mmol/L — ABNORMAL LOW (ref 135–145)

## 2019-03-10 LAB — GLUCOSE, CAPILLARY
Glucose-Capillary: 277 mg/dL — ABNORMAL HIGH (ref 70–99)
Glucose-Capillary: 286 mg/dL — ABNORMAL HIGH (ref 70–99)
Glucose-Capillary: 209 mg/dL — ABNORMAL HIGH (ref 70–99)
Glucose-Capillary: 346 mg/dL — ABNORMAL HIGH (ref 70–99)

## 2019-03-10 MED ORDER — FREESTYLE LIBRE 14 DAY SENSOR MISC: 1.0000 | 0 refills | Status: DC

## 2019-03-10 MED ORDER — LACTULOSE 10 GM/15ML PO SOLN: 10.0000 g | Freq: Every day | ORAL | Status: DC | PRN

## 2019-03-10 MED ORDER — BLOOD GLUCOSE METER KIT: 1.0000 | PACK | 0 refills | Status: DC

## 2019-03-10 MED ORDER — CALCIUM CARBONATE ANTACID 500 MG PO CHEW
1.0000 | CHEWABLE_TABLET | Freq: Three times a day (TID) | ORAL | Status: DC | PRN
  Administered 2019-03-10: 200 mg via ORAL
  Filled 2019-03-10: qty 1

## 2019-03-10 MED ORDER — INSULIN GLARGINE 100 UNIT/ML ~~LOC~~ SOLN
45.0000 [IU] | Freq: Every day | SUBCUTANEOUS | Status: DC
  Administered 2019-03-10: 45 [IU] via SUBCUTANEOUS
  Filled 2019-03-10 (×3): qty 0.45

## 2019-03-10 MED ORDER — TRAMADOL HCL 50 MG PO TABS
50.0000 mg | ORAL_TABLET | Freq: Once | ORAL | Status: DC
  Filled 2019-03-10: qty 1

## 2019-03-10 MED ORDER — POLYVINYL ALCOHOL 1.4 % OP SOLN
1.0000 [drp] | OPHTHALMIC | Status: DC | PRN
  Administered 2019-03-10: 1 [drp] via OPHTHALMIC
  Filled 2019-03-10: qty 15

## 2019-03-10 MED ORDER — INSULIN PEN NEEDLE 32G X 4 MM MISC: 1.0000 | 0 refills | Status: DC

## 2019-03-10 MED ORDER — FAMOTIDINE 20 MG PO TABS
20.0000 mg | ORAL_TABLET | Freq: Every day | ORAL | Status: DC
  Administered 2019-03-11: 20 mg via ORAL
  Filled 2019-03-10: qty 1

## 2019-03-10 MED ORDER — FAMOTIDINE 20 MG PO TABS
10.0000 mg | ORAL_TABLET | Freq: Every day | ORAL | Status: DC
  Administered 2019-03-10: 12:00:00 10 mg via ORAL
  Filled 2019-03-10: qty 1
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
## 2019-03-10 NOTE — Progress Notes (Signed)
Pt has refused cpap for second night. RT will monitor.

## 2019-03-10 NOTE — Progress Notes (Signed)
Inpatient Diabetes Program Recommendations  AACE/ADA: New Consensus Statement on Inpatient Glycemic Control (2015)  Target Ranges:  Prepandial:   less than 140 mg/dL      Peak postprandial:   less than 180 mg/dL (1-2 hours)      Critically ill patients:  140 - 180 mg/dL   Order placed for [employer] 14 day sensor for discharge. Went to patient room to assist patient in applying sensor and operating reader. Sensor applied to the back of the left arm at 4:00pm. Patient understands we will still use approve hospital glucose monitor while he is inpatient.  Thanks,  Christena Deem RN, MSN, BC-ADM Inpatient Diabetes Coordinator Team Pager [phone] (8a-5p)

## 2019-03-11 LAB — BASIC METABOLIC PANEL
Anion gap: 12 (ref 5–15)
BUN: 10 mg/dL (ref 6–20)
CO2: 20 mmol/L — ABNORMAL LOW (ref 22–32)
Calcium: 9.1 mg/dL (ref 8.9–10.3)
Chloride: 98 mmol/L (ref 98–111)
Creatinine, Ser: 0.85 mg/dL (ref 0.61–1.24)
GFR calc Af Amer: >60 mL/min (ref >60)
GFR calc non Af Amer: >60 mL/min (ref >60)
Glucose, Bld: 226 mg/dL — ABNORMAL HIGH (ref 70–99)
Potassium: 4 mmol/L (ref 3.5–5.1)
Sodium: 130 mmol/L — ABNORMAL LOW (ref 135–145)

## 2019-03-11 LAB — GLUCOSE, CAPILLARY
Glucose-Capillary: 247 mg/dL — ABNORMAL HIGH (ref 70–99)
Glucose-Capillary: 323 mg/dL — ABNORMAL HIGH (ref 70–99)

## 2019-03-11 LAB — GLUTAMIC ACID DECARBOXYLASE AUTO ABS: Glutamic Acid Decarb Ab: <5 U/mL (ref 0.0–5.0)

## 2019-03-11 MED ORDER — METFORMIN HCL 500 MG PO TABS: 500.0000 mg | ORAL_TABLET | Freq: Two times a day (BID) | ORAL | Status: DC

## 2019-03-11 MED ORDER — INSULIN GLARGINE 100 UNIT/ML ~~LOC~~ SOLN
40.0000 [IU] | Freq: Every day | SUBCUTANEOUS | Status: DC
  Administered 2019-03-11: 40 [IU] via SUBCUTANEOUS
  Filled 2019-03-11 (×2): qty 0.4

## 2019-03-11 MED ORDER — INSULIN GLARGINE 100 UNIT/ML ~~LOC~~ SOLN
50.0000 [IU] | Freq: Every day | SUBCUTANEOUS | Status: DC
  Filled 2019-03-11 (×2): qty 0.5

## 2019-03-11 MED ORDER — INSULIN GLARGINE 100 UNIT/ML SOLOSTAR PEN: 40.0000 [IU] | PEN_INJECTOR | Freq: Every day | SUBCUTANEOUS | 0 refills | Status: DC

## 2019-03-11 MED ORDER — INSULIN GLARGINE 100 UNIT/ML ~~LOC~~ SOLN
40.0000 [IU] | Freq: Every day | SUBCUTANEOUS | Status: DC
  Filled 2019-03-11: qty 0.4

## 2019-03-11 MED ORDER — METFORMIN HCL 500 MG PO TABS: 500.0000 mg | ORAL_TABLET | Freq: Two times a day (BID) | ORAL | 0 refills | Status: DC

## 2019-03-11 NOTE — Progress Notes (Signed)
Pt provided education and discharge instructions. Pt taught about DM and learned to inject himself. Pt has all belongings. Vitals  stable. Pt denies any complaints. Telebox removed/ccmd notified. IV removed and intact. Volunteers called to tx pt via wheelchair to valet to meet ride.  Lacy Duverney, RN

## 2019-03-11 NOTE — Discharge Summary (Signed)
Michael [name] Discharge Summary  Patient name: Michael [name] Medical record number: [id number] Date of birth: May 21, 1966 Age: 53 y.o. Gender: male Date of Admission: 03/07/2019  Date of Discharge: 03/11/19 Admitting Physician: Dickie La, MD  Primary Care Provider: Haydee Salter, MD Consultants: none  Indication for Hospitalization: HHS  Discharge Diagnoses/Problem List:  New onset diabetes Anxiety Hidradenitis  HTN Cirrhosis OSA Chronic pain Tobacco use  Disposition: home  Discharge Condition: stable, improved  Discharge Exam:  General: sitting up in bed, in NAD CV: RRR, no murmurs Lungs: CTAB, NWOB on RA Abd: soft,nontender, nondistended, + bowel sounds Extremities: WWP, no LE edema Neuro: alert and awake, grossly normal  Brief [name] Course:  Michael [name] is a 53 y.o. male with PMH significant for alcoholic cirrhosis, hypertension, asthma, anxiety, OSA, chronic pain who presented with altered mental status due to hypoglycemia from [facility] in the setting of new onset diabetes.  Was started on an insulin drip and did well so was eventually transitioned to subcutaneous insulin.  He received diabetes education and his glucose were under improved control.  His diabetes medication regimen on discharge was metformin 500 mg BID and Lantus 40 units daily.  He was told he needed to follow-up with PCP for titration as an outpatient.  Of note during his [name] stay he had some issues with constipation with lactulose for alcoholic cirrhosis.  His calculated meld score during this [name] stay was 24.  He did end up having bowel movements with lactulose which he disliked and it was changed to an as-needed basis.   Issues for Follow Up:  1. Please increase metformin to 1000 mg BID based on patient's tolerability.  He was started on 500 mg twice daily on discharge. 2. Lantus will also need to be titrated as well.  At discharge was instructed to stay on 40 units  daily. 3. Please titrate home lactulose for appropriate bowel movements 4. Monitor BPs as home medications were restarted on discharge  Significant Procedures: none  Significant Labs and Imaging:  Recent Labs  Lab 03/07/19 1525 03/07/19 1930 03/09/19 0349  WBC 11.8*  --  7.2  HGB 12.7* 11.6* 10.1*  HCT 36.9* 34.0* 28.2*  PLT 143*  --  96*   Recent Labs  Lab 03/07/19 1525  03/08/19 0749 03/08/19 1908 03/09/19 0349 03/10/19 0503 03/11/19 0437  NA 110*   < > 128* 120* 122* 130* 130*  K 4.5   < > 4.5 5.1 5.0 4.0 4.0  CL 69*   < > 98 92* 94* 100 98  CO2 20*   < > 21* 18* 19* 18* 20*  GLUCOSE 1,143*   < > 129* 444* 468* 290* 226*  BUN 30*   < > 25* 26* 30* 14 10  CREATININE 1.90*   < > 1.42* 1.99* 2.02* 1.01 0.85  CALCIUM 9.4   < > 8.7* 8.5* 8.4* 9.0 9.1  ALKPHOS 133*  --   --   --  101  --   --   AST 37  --   --   --  57*  --   --   ALT 32  --   --   --  34  --   --   ALBUMIN 3.3*  --   --   --  2.6*  --   --    < > = values in this interval not displayed.     Results/Tests Pending at Time of Discharge: GAD antibody  Discharge Medications:  Allergies as of 03/11/2019      Reactions   Cat Hair Extract Anaphylaxis   Iodine-131 Shortness Of Breath, Other (See Comments)   MRI contrast dye   Other Anaphylaxis   Penicillins Rash, Other (See Comments)   Has patient had a PCN reaction causing immediate rash, facial/tongue/throat swelling, SOB or lightheadedness with hypotension: yes Has patient had a PCN reaction causing severe rash involving mucus membranes or skin necrosis: no Has patient had a PCN reaction that required hospitalization: no Has patient had a PCN reaction occurring within the last 10 years: no If all of the above answers are "NO", then may proceed with Cephalosporin use.   Nickel Itching, Other (See Comments)   redness   Latex Rash      Medication List    STOP taking these medications   celecoxib 200 MG capsule Commonly known as:  CELEBREX      TAKE these medications   blood glucose meter kit and supplies 1 each by Other route as directed. (FOR ICD-10 E10.9, E11.9).   Cialis 5 MG tablet Generic drug:  tadalafil Take 5 mg by mouth daily as needed for erectile dysfunction.   dapsone 100 MG tablet Take 100 mg by mouth daily.   DULoxetine 60 MG capsule Commonly known as:  CYMBALTA Take 1 capsule (60 mg total) by mouth daily.   EPINEPHrine 0.3 mg/0.3 mL Soaj injection Commonly known as:  EPI-PEN Inject 0.3 mg into the muscle once.   ferrous sulfate 325 (65 FE) MG tablet Take 325 mg by mouth daily before supper.   FreeStyle Libre 14 Day Sensor Misc 1 each by Does not apply route as directed.   gabapentin 300 MG capsule Commonly known as:  NEURONTIN TAKE ONE CAPSULE BY MOUTH 3 TIMES A DAY   Humira 40 MG/0.8ML Pskt Generic drug:  Adalimumab Inject 40 mg into the skin every Thursday.   hydrochlorothiazide 25 MG tablet Commonly known as:  HYDRODIURIL Take 25 mg by mouth daily.   HYDROcodone-acetaminophen 5-325 MG tablet Commonly known as:  Norco Take 1 tablet by mouth 3 (three) times daily as needed for moderate pain.   Insulin Glargine 100 UNIT/ML Solostar Pen Commonly known as:  Lantus SoloStar Inject 40 Units into the skin daily for 30 days.   Insulin Pen Needle 32G X 4 MM Misc 1 each by Does not apply route as directed.   lactulose 10 GM/15ML solution Commonly known as:  CHRONULAC Take 15 mLs (10 g total) 3 (three) times daily by mouth. What changed:    when to take this  reasons to take this   lisinopril 40 MG tablet Commonly known as:  PRINIVIL,ZESTRIL Take 40 mg by mouth daily.   metFORMIN 500 MG tablet Commonly known as:  GLUCOPHAGE Take 1 tablet (500 mg total) by mouth 2 (two) times daily with a meal.   methocarbamol 500 MG tablet Commonly known as:  ROBAXIN TAKE 1 TABLET BY MOUTH EVERY 6 HOURS AS NEEDED FOR MUSCLE SPASM   montelukast 10 MG tablet Commonly known as:  SINGULAIR Take 10  mg by mouth at bedtime.   naloxone 4 MG/0.1ML Liqd nasal spray kit Commonly known as:  NARCAN Take as directed for respiratory depression or narcotic overdose.   omeprazole 20 MG capsule Commonly known as:  PRILOSEC Take 1 capsule (20 mg total) by mouth 2 (two) times daily.   ondansetron 8 MG tablet Commonly known as:  ZOFRAN Take 8 mg by mouth every 8 (eight)  hours as needed for nausea or vomiting.   potassium chloride 10 MEQ CR capsule Commonly known as:  MICRO-K Take 10 mEq by mouth daily.   ProAir HFA 108 (90 Base) MCG/ACT inhaler Generic drug:  albuterol Inhale 2 puffs into the lungs every 4 (four) hours as needed for wheezing or shortness of breath.   propranolol 10 MG tablet Commonly known as:  INDERAL Take 10 mg by mouth 2 (two) times daily.   REMICADE IV Inject into the vein every 6 (six) weeks.   traZODone 100 MG tablet Commonly known as:  DESYREL Take 1-2 tablets by mouth at bedtime.   zolpidem 10 MG tablet Commonly known as:  AMBIEN Take 10 mg by mouth at bedtime.       Discharge Instructions: Please refer to Patient Instructions section of EMR for full details.  Patient was counseled important signs and symptoms that should prompt return to medical care, changes in medications, dietary instructions, activity restrictions, and follow up appointments.   Follow-Up Appointments: Follow-up Information    Haydee Salter, MD. Call.   Specialty:  Internal Medicine Contact information: [place] [address] [place], [place], DO 03/11/2019, 12:05 PM PGY-3, [place]

## 2019-03-11 NOTE — Progress Notes (Signed)
Noted referral for PCP needs, per CSW pt has established Primary care- confirmed as Dr.  Orland Penman, [facility]- as noted in Epic.

## 2019-03-20 ENCOUNTER — Other Ambulatory Visit (INDEPENDENT_AMBULATORY_CARE_PROVIDER_SITE_OTHER): Payer: Self-pay | Admitting: Specialist

## 2019-03-20 MED ORDER — HYDROCODONE-ACETAMINOPHEN 5-325 MG PO TABS: 1.0000 | ORAL_TABLET | Freq: Three times a day (TID) | ORAL | 0 refills | Status: DC | PRN

## 2019-03-20 NOTE — Telephone Encounter (Signed)
Hydrocodone refill request.  

## 2019-03-20 NOTE — Telephone Encounter (Signed)
Nitka patient 

## 2019-04-07 ENCOUNTER — Other Ambulatory Visit: Payer: Self-pay | Admitting: Family Medicine

## 2019-04-10 ENCOUNTER — Ambulatory Visit: Payer: Self-pay

## 2019-04-10 ENCOUNTER — Encounter: Payer: Self-pay | Admitting: Specialist

## 2019-04-10 ENCOUNTER — Other Ambulatory Visit: Payer: Self-pay

## 2019-04-10 ENCOUNTER — Ambulatory Visit (INDEPENDENT_AMBULATORY_CARE_PROVIDER_SITE_OTHER): Payer: BLUE CROSS/BLUE SHIELD | Admitting: Specialist

## 2019-04-10 VITALS — BP 111/70 | HR 88 | Ht 70.5 in | Wt 191.0 lb

## 2019-04-10 DIAGNOSIS — G5622 Lesion of ulnar nerve, left upper limb: Secondary | ICD-10-CM

## 2019-04-10 DIAGNOSIS — E1100 Type 2 diabetes mellitus with hyperosmolarity without nonketotic hyperglycemic-hyperosmolar coma (NKHHC): Secondary | ICD-10-CM

## 2019-04-10 DIAGNOSIS — G5601 Carpal tunnel syndrome, right upper limb: Secondary | ICD-10-CM | POA: Diagnosis not present

## 2019-04-10 DIAGNOSIS — S92515A Nondisplaced fracture of proximal phalanx of left lesser toe(s), initial encounter for closed fracture: Secondary | ICD-10-CM

## 2019-04-10 DIAGNOSIS — M79671 Pain in right foot: Secondary | ICD-10-CM | POA: Diagnosis not present

## 2019-04-10 DIAGNOSIS — E1165 Type 2 diabetes mellitus with hyperglycemia: Secondary | ICD-10-CM

## 2019-04-10 MED ORDER — VITAMIN B-1 100 MG PO TABS: 100.0000 mg | ORAL_TABLET | Freq: Every day | ORAL | 2 refills | Status: DC

## 2019-04-10 MED ORDER — VITAMIN B-6 100 MG PO TABS: 100.0000 mg | ORAL_TABLET | Freq: Every day | ORAL | 2 refills | Status: DC

## 2019-04-10 NOTE — Patient Instructions (Signed)
Avoid frequent bending and stooping  No lifting greater than 10 lbs. May use ice or moist heat for pain. Weight loss is of benefit. You are to avoid tylenol due to concerns of liver changes. No steroids are recommended. Exercise is important to improve your indurance and does allow people to function better inspite of back pain.  May buddy tape the right little toe to the 4th toe and use a shoe that is stiffer and walk flat footed for the next 3 weeks.  Usually takes 3 weeks for early callus at the toe fracture site then about 6 weeks to full healing.  Start Thiamine dailly for 2 weeks and Vitamin B6  For 2 weeks to assess affect on the right arm numbness and hand weakness.  We with schedule right elbow ulnar neurlysis and right carpal tunnel release. A carpal tunnel splint provided.  May use the wrist splints as needed after full time use for 2 weeks.   Our office will contact you to schedule for a left open carpal tunnel release. Tivis RingerSherri Billings is the surgery scheduler and she will get approval from your insurance and call you. Risk of surgery includes risk infection 1:100, bleeding is not usual, 1 in 1,000,000 risk of blood loss that is significant. Risk to the nerve is 1:30,000.  Carpal Tunnel Syndrome  Carpal tunnel syndrome is a condition that causes pain in your hand and arm. The carpal tunnel is a narrow area located on the palm side of your wrist. Repeated wrist motion or certain diseases may cause swelling within the tunnel. This swelling pinches the main nerve in the wrist (median nerve). What are the causes? This condition may be caused by:  Repeated wrist motions.  Wrist injuries.  Arthritis.  A cyst or tumor in the carpal tunnel.  Fluid buildup during pregnancy. Sometimes the cause of this condition is not known. What increases the risk? This condition is more likely to develop in:  People who have jobs that cause them to repeatedly move their wrists in the same  motion, such as Health visitorbutchers and cashiers.  Women.  People with certain conditions, such as: ? Diabetes. ? Obesity. ? An underactive thyroid (hypothyroidism). ? Kidney failure. What are the signs or symptoms? Symptoms of this condition include:  A tingling feeling in your fingers, especially in your thumb, index, and middle fingers.  Tingling or numbness in your hand.  An aching feeling in your entire arm, especially when your wrist and elbow are bent for long periods of time.  Wrist pain that goes up your arm to your shoulder.  Pain that goes down into your palm or fingers.  A weak feeling in your hands. You may have trouble grabbing and holding items. Your symptoms may feel worse during the night. How is this diagnosed? This condition is diagnosed with a medical history and physical exam. You may also have tests, including:  An electromyogram (EMG). This test measures electrical signals sent by your nerves into the muscles.  X-rays. How is this treated? Treatment for this condition includes:  Lifestyle changes. It is important to stop doing or modify the activity that caused your condition.  Physical or occupational therapy.  Medicines for pain and inflammation. This may include medicine that is injected into your wrist.  A wrist splint.  Surgery. Follow these instructions at home: If you have a splint:   Wear it as told by your health care provider. Remove it only as told by your health care provider.  Loosen the splint if your fingers become numb and tingle, or if they turn cold and blue.  Keep the splint clean and dry. General instructions   Take over-the-counter and prescription medicines only as told by your health care provider.  Rest your wrist from any activity that may be causing your pain. If your condition is work related, talk to your employer about changes that can be made, such as getting a wrist pad to use while typing.  If directed, apply ice to  the painful area: ? Put ice in a plastic bag. ? Place a towel between your skin and the bag. ? Leave the ice on for 20 minutes, 2-3 times per day.  Keep all follow-up visits as told by your health care provider. This is important.  Do any exercises as told by your health care provider, physical therapist, or occupational therapist. Contact a health care provider if:  You have new symptoms.  Your pain is not controlled with medicines.  Your symptoms get worse. This information is not intended to replace advice given to you by your health care provider. Make sure you discuss any questions you have with your health care provider. Document Released: 11/09/2000 Document Revised: 03/22/2016 Document Reviewed: 07/24/2017 Elsevier Interactive Patient Education  2017 [employer].
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
## 2019-04-16 ENCOUNTER — Other Ambulatory Visit: Payer: Self-pay | Admitting: Family Medicine

## 2019-05-24 ENCOUNTER — Encounter: Payer: Self-pay | Admitting: Specialist

## 2019-05-31 ENCOUNTER — Encounter: Payer: Self-pay | Admitting: Specialist

## 2019-06-01 ENCOUNTER — Other Ambulatory Visit: Payer: Self-pay

## 2019-06-01 ENCOUNTER — Encounter (HOSPITAL_BASED_OUTPATIENT_CLINIC_OR_DEPARTMENT_OTHER): Payer: Self-pay | Admitting: *Deleted

## 2019-06-01 NOTE — Progress Notes (Signed)
Patient's Epic chart and Care Everywhere records reviewed with Dr Lanetta Inch, [place] for [facility].

## 2019-06-01 NOTE — Telephone Encounter (Signed)
It is just an ulnar nerve decompression.

## 2019-06-04 ENCOUNTER — Other Ambulatory Visit (HOSPITAL_COMMUNITY)
Admission: RE | Admit: 2019-06-04 | Discharge: 2019-06-04 | Disposition: A | Discharge status: Home / Self Care | Payer: BLUE CROSS/BLUE SHIELD | Source: Ambulatory Visit | Attending: Specialist | Admitting: Specialist

## 2019-06-04 ENCOUNTER — Encounter (HOSPITAL_BASED_OUTPATIENT_CLINIC_OR_DEPARTMENT_OTHER)
Admission: RE | Admit: 2019-06-04 | Discharge: 2019-06-04 | Disposition: A | Discharge status: Home / Self Care | Payer: BLUE CROSS/BLUE SHIELD | Source: Ambulatory Visit | Attending: Specialist | Admitting: Specialist

## 2019-06-04 ENCOUNTER — Other Ambulatory Visit: Payer: Self-pay

## 2019-06-04 DIAGNOSIS — Z7951 Long term (current) use of inhaled steroids: Secondary | ICD-10-CM | POA: Diagnosis not present

## 2019-06-04 DIAGNOSIS — K7460 Unspecified cirrhosis of liver: Secondary | ICD-10-CM | POA: Diagnosis not present

## 2019-06-04 DIAGNOSIS — E1142 Type 2 diabetes mellitus with diabetic polyneuropathy: Secondary | ICD-10-CM | POA: Diagnosis not present

## 2019-06-04 DIAGNOSIS — F17210 Nicotine dependence, cigarettes, uncomplicated: Secondary | ICD-10-CM | POA: Diagnosis not present

## 2019-06-04 DIAGNOSIS — G4733 Obstructive sleep apnea (adult) (pediatric): Secondary | ICD-10-CM | POA: Diagnosis not present

## 2019-06-04 DIAGNOSIS — Z833 Family history of diabetes mellitus: Secondary | ICD-10-CM | POA: Diagnosis not present

## 2019-06-04 DIAGNOSIS — Z1159 Encounter for screening for other viral diseases: Secondary | ICD-10-CM | POA: Diagnosis not present

## 2019-06-04 DIAGNOSIS — Z91040 Latex allergy status: Secondary | ICD-10-CM | POA: Diagnosis not present

## 2019-06-04 DIAGNOSIS — I10 Essential (primary) hypertension: Secondary | ICD-10-CM | POA: Diagnosis not present

## 2019-06-04 DIAGNOSIS — D649 Anemia, unspecified: Secondary | ICD-10-CM | POA: Diagnosis not present

## 2019-06-04 DIAGNOSIS — Z888 Allergy status to other drugs, medicaments and biological substances status: Secondary | ICD-10-CM | POA: Diagnosis not present

## 2019-06-04 DIAGNOSIS — G4700 Insomnia, unspecified: Secondary | ICD-10-CM | POA: Diagnosis not present

## 2019-06-04 DIAGNOSIS — Z880 Allergy status to penicillin: Secondary | ICD-10-CM | POA: Diagnosis not present

## 2019-06-04 DIAGNOSIS — Z8249 Family history of ischemic heart disease and other diseases of the circulatory system: Secondary | ICD-10-CM | POA: Diagnosis not present

## 2019-06-04 DIAGNOSIS — M1990 Unspecified osteoarthritis, unspecified site: Secondary | ICD-10-CM | POA: Diagnosis not present

## 2019-06-04 DIAGNOSIS — J45909 Unspecified asthma, uncomplicated: Secondary | ICD-10-CM | POA: Diagnosis not present

## 2019-06-04 DIAGNOSIS — K219 Gastro-esophageal reflux disease without esophagitis: Secondary | ICD-10-CM | POA: Diagnosis not present

## 2019-06-04 DIAGNOSIS — F329 Major depressive disorder, single episode, unspecified: Secondary | ICD-10-CM | POA: Diagnosis not present

## 2019-06-04 DIAGNOSIS — Z794 Long term (current) use of insulin: Secondary | ICD-10-CM | POA: Diagnosis not present

## 2019-06-04 DIAGNOSIS — Z79899 Other long term (current) drug therapy: Secondary | ICD-10-CM | POA: Diagnosis not present

## 2019-06-04 DIAGNOSIS — F1010 Alcohol abuse, uncomplicated: Secondary | ICD-10-CM | POA: Diagnosis not present

## 2019-06-04 DIAGNOSIS — Z01812 Encounter for preprocedural laboratory examination: Secondary | ICD-10-CM | POA: Diagnosis not present

## 2019-06-04 DIAGNOSIS — I8510 Secondary esophageal varices without bleeding: Secondary | ICD-10-CM | POA: Diagnosis not present

## 2019-06-04 DIAGNOSIS — G5621 Lesion of ulnar nerve, right upper limb: Secondary | ICD-10-CM | POA: Diagnosis not present

## 2019-06-04 DIAGNOSIS — F419 Anxiety disorder, unspecified: Secondary | ICD-10-CM | POA: Diagnosis not present

## 2019-06-04 LAB — COMPREHENSIVE METABOLIC PANEL
ALT: 52 U/L — ABNORMAL HIGH (ref 0–44)
AST: 33 U/L (ref 15–41)
Albumin: 3.5 g/dL (ref 3.5–5.0)
Alkaline Phosphatase: 62 U/L (ref 38–126)
Anion gap: 11 (ref 5–15)
BUN: 32 mg/dL — ABNORMAL HIGH (ref 6–20)
CO2: 18 mmol/L — ABNORMAL LOW (ref 22–32)
Calcium: 9.5 mg/dL (ref 8.9–10.3)
Chloride: 104 mmol/L (ref 98–111)
Creatinine, Ser: 1.25 mg/dL — ABNORMAL HIGH (ref 0.61–1.24)
GFR calc Af Amer: >60 mL/min (ref >60)
GFR calc non Af Amer: >60 mL/min (ref >60)
Glucose, Bld: 146 mg/dL — ABNORMAL HIGH (ref 70–99)
Potassium: 4 mmol/L (ref 3.5–5.1)
Sodium: 133 mmol/L — ABNORMAL LOW (ref 135–145)
Total Bilirubin: 0.6 mg/dL (ref 0.3–1.2)
Total Protein: 6.9 g/dL (ref 6.5–8.1)

## 2019-06-04 NOTE — Progress Notes (Signed)
Reminded pt to bring inhalers day of surgery, pt verbalized understanding.

## 2019-06-05 LAB — SARS CORONAVIRUS 2 (TAT 6-24 HRS): SARS Coronavirus 2: NEGATIVE

## 2019-06-08 ENCOUNTER — Encounter (HOSPITAL_BASED_OUTPATIENT_CLINIC_OR_DEPARTMENT_OTHER)
Admission: RE | Disposition: A | Discharge status: Home / Self Care | Payer: Self-pay | Source: Home / Self Care | Attending: Specialist

## 2019-06-08 ENCOUNTER — Ambulatory Visit (HOSPITAL_BASED_OUTPATIENT_CLINIC_OR_DEPARTMENT_OTHER): Payer: BLUE CROSS/BLUE SHIELD | Admitting: Certified Registered Nurse Anesthetist

## 2019-06-08 ENCOUNTER — Encounter (HOSPITAL_BASED_OUTPATIENT_CLINIC_OR_DEPARTMENT_OTHER): Payer: Self-pay

## 2019-06-08 ENCOUNTER — Ambulatory Visit (HOSPITAL_BASED_OUTPATIENT_CLINIC_OR_DEPARTMENT_OTHER)
Admission: RE | Admit: 2019-06-08 | Discharge: 2019-06-08 | Disposition: A | Discharge status: Home / Self Care | Payer: BLUE CROSS/BLUE SHIELD | Attending: Specialist | Admitting: Specialist

## 2019-06-08 ENCOUNTER — Other Ambulatory Visit: Payer: Self-pay

## 2019-06-08 DIAGNOSIS — I8510 Secondary esophageal varices without bleeding: Secondary | ICD-10-CM | POA: Insufficient documentation

## 2019-06-08 DIAGNOSIS — G5621 Lesion of ulnar nerve, right upper limb: Secondary | ICD-10-CM

## 2019-06-08 DIAGNOSIS — K7460 Unspecified cirrhosis of liver: Secondary | ICD-10-CM | POA: Insufficient documentation

## 2019-06-08 DIAGNOSIS — G4733 Obstructive sleep apnea (adult) (pediatric): Secondary | ICD-10-CM | POA: Insufficient documentation

## 2019-06-08 DIAGNOSIS — G4700 Insomnia, unspecified: Secondary | ICD-10-CM | POA: Insufficient documentation

## 2019-06-08 DIAGNOSIS — K219 Gastro-esophageal reflux disease without esophagitis: Secondary | ICD-10-CM | POA: Insufficient documentation

## 2019-06-08 DIAGNOSIS — F329 Major depressive disorder, single episode, unspecified: Secondary | ICD-10-CM | POA: Insufficient documentation

## 2019-06-08 DIAGNOSIS — J45909 Unspecified asthma, uncomplicated: Secondary | ICD-10-CM | POA: Insufficient documentation

## 2019-06-08 DIAGNOSIS — Z794 Long term (current) use of insulin: Secondary | ICD-10-CM | POA: Insufficient documentation

## 2019-06-08 DIAGNOSIS — F1010 Alcohol abuse, uncomplicated: Secondary | ICD-10-CM | POA: Insufficient documentation

## 2019-06-08 DIAGNOSIS — Z8249 Family history of ischemic heart disease and other diseases of the circulatory system: Secondary | ICD-10-CM | POA: Insufficient documentation

## 2019-06-08 DIAGNOSIS — Z880 Allergy status to penicillin: Secondary | ICD-10-CM | POA: Insufficient documentation

## 2019-06-08 DIAGNOSIS — D649 Anemia, unspecified: Secondary | ICD-10-CM | POA: Insufficient documentation

## 2019-06-08 DIAGNOSIS — I10 Essential (primary) hypertension: Secondary | ICD-10-CM | POA: Insufficient documentation

## 2019-06-08 DIAGNOSIS — F17210 Nicotine dependence, cigarettes, uncomplicated: Secondary | ICD-10-CM | POA: Insufficient documentation

## 2019-06-08 DIAGNOSIS — M1990 Unspecified osteoarthritis, unspecified site: Secondary | ICD-10-CM | POA: Insufficient documentation

## 2019-06-08 DIAGNOSIS — Z91040 Latex allergy status: Secondary | ICD-10-CM | POA: Insufficient documentation

## 2019-06-08 DIAGNOSIS — Z79899 Other long term (current) drug therapy: Secondary | ICD-10-CM | POA: Insufficient documentation

## 2019-06-08 DIAGNOSIS — Z833 Family history of diabetes mellitus: Secondary | ICD-10-CM | POA: Insufficient documentation

## 2019-06-08 DIAGNOSIS — Z888 Allergy status to other drugs, medicaments and biological substances status: Secondary | ICD-10-CM | POA: Insufficient documentation

## 2019-06-08 DIAGNOSIS — F419 Anxiety disorder, unspecified: Secondary | ICD-10-CM | POA: Insufficient documentation

## 2019-06-08 DIAGNOSIS — Z7951 Long term (current) use of inhaled steroids: Secondary | ICD-10-CM | POA: Insufficient documentation

## 2019-06-08 DIAGNOSIS — E1142 Type 2 diabetes mellitus with diabetic polyneuropathy: Secondary | ICD-10-CM | POA: Insufficient documentation

## 2019-06-08 HISTORY — DX: Lesion of ulnar nerve, right upper limb: G56.21

## 2019-06-08 HISTORY — DX: Disease of blood and blood-forming organs, unspecified: D75.9

## 2019-06-08 HISTORY — PX: ULNAR NERVE TRANSPOSITION: SHX2595

## 2019-06-08 HISTORY — DX: Type 2 diabetes mellitus without complications: E11.9

## 2019-06-08 LAB — GLUCOSE, CAPILLARY
Glucose-Capillary: 116 mg/dL — ABNORMAL HIGH (ref 70–99)
Glucose-Capillary: 97 mg/dL (ref 70–99)

## 2019-06-08 SURGERY — ULNAR NERVE DECOMPRESSION/TRANSPOSITION
Anesthesia: General | Site: Elbow | Laterality: Right

## 2019-06-08 MED ORDER — LACTATED RINGERS IV SOLN: INTRAVENOUS | Status: DC

## 2019-06-08 MED ORDER — LIDOCAINE HCL (CARDIAC) PF 100 MG/5ML IV SOSY
PREFILLED_SYRINGE | INTRAVENOUS | Status: DC | PRN
  Administered 2019-06-08: 100 mg via INTRAVENOUS

## 2019-06-08 MED ORDER — FENTANYL CITRATE (PF) 100 MCG/2ML IJ SOLN
INTRAMUSCULAR | Status: AC
  Filled 2019-06-08: qty 2

## 2019-06-08 MED ORDER — VANCOMYCIN HCL IN DEXTROSE 1-5 GM/200ML-% IV SOLN
INTRAVENOUS | Status: AC
  Filled 2019-06-08: qty 200

## 2019-06-08 MED ORDER — VANCOMYCIN HCL IN DEXTROSE 1-5 GM/200ML-% IV SOLN
1000.0000 mg | INTRAVENOUS | Status: AC
  Administered 2019-06-08: 07:00:00 1000 mg via INTRAVENOUS

## 2019-06-08 MED ORDER — MEPERIDINE HCL 25 MG/ML IJ SOLN: 6.2500 mg | INTRAMUSCULAR | Status: DC | PRN

## 2019-06-08 MED ORDER — ONDANSETRON HCL 4 MG/2ML IJ SOLN
INTRAMUSCULAR | Status: AC
  Filled 2019-06-08: qty 2

## 2019-06-08 MED ORDER — OXYCODONE HCL 5 MG PO TABS
ORAL_TABLET | ORAL | Status: AC
  Filled 2019-06-08: qty 2

## 2019-06-08 MED ORDER — BUPIVACAINE HCL (PF) 0.5 % IJ SOLN
INTRAMUSCULAR | Status: DC | PRN
  Administered 2019-06-08: 15 mL

## 2019-06-08 MED ORDER — OXYCODONE HCL 5 MG PO CAPS: 5.0000 mg | ORAL_CAPSULE | ORAL | 0 refills | Status: DC | PRN

## 2019-06-08 MED ORDER — LACTATED RINGERS IV SOLN
INTRAVENOUS | Status: DC
  Administered 2019-06-08: 08:00:00 via INTRAVENOUS

## 2019-06-08 MED ORDER — METOCLOPRAMIDE HCL 5 MG/ML IJ SOLN: 10.0000 mg | Freq: Once | INTRAMUSCULAR | Status: DC | PRN

## 2019-06-08 MED ORDER — BUPIVACAINE HCL (PF) 0.5 % IJ SOLN
INTRAMUSCULAR | Status: AC
  Filled 2019-06-08: qty 30

## 2019-06-08 MED ORDER — SCOPOLAMINE 1 MG/3DAYS TD PT72: 1.0000 | MEDICATED_PATCH | Freq: Once | TRANSDERMAL | Status: DC

## 2019-06-08 MED ORDER — GLYCOPYRROLATE 0.2 MG/ML IJ SOLN
INTRAMUSCULAR | Status: DC | PRN
  Administered 2019-06-08: 0.2 mg via INTRAVENOUS

## 2019-06-08 MED ORDER — PROPOFOL 10 MG/ML IV BOLUS
INTRAVENOUS | Status: AC
  Filled 2019-06-08: qty 40

## 2019-06-08 MED ORDER — OXYCODONE HCL 5 MG PO TABS
10.0000 mg | ORAL_TABLET | Freq: Once | ORAL | Status: AC
  Administered 2019-06-08: 10 mg via ORAL

## 2019-06-08 MED ORDER — DEXAMETHASONE SODIUM PHOSPHATE 10 MG/ML IJ SOLN
INTRAMUSCULAR | Status: AC
  Filled 2019-06-08: qty 1

## 2019-06-08 MED ORDER — ONDANSETRON HCL 4 MG/2ML IJ SOLN
INTRAMUSCULAR | Status: DC | PRN
  Administered 2019-06-08: 4 mg via INTRAVENOUS

## 2019-06-08 MED ORDER — ALBUMIN HUMAN 5 % IV SOLN
INTRAVENOUS | Status: DC | PRN
  Administered 2019-06-08: 08:00:00 via INTRAVENOUS

## 2019-06-08 MED ORDER — LIDOCAINE 2% (20 MG/ML) 5 ML SYRINGE
INTRAMUSCULAR | Status: AC
  Filled 2019-06-08: qty 5

## 2019-06-08 MED ORDER — MIDAZOLAM HCL 2 MG/2ML IJ SOLN
1.0000 mg | INTRAMUSCULAR | Status: DC | PRN
  Administered 2019-06-08: 2 mg via INTRAVENOUS

## 2019-06-08 MED ORDER — MIDAZOLAM HCL 2 MG/2ML IJ SOLN
INTRAMUSCULAR | Status: AC
  Filled 2019-06-08: qty 2

## 2019-06-08 MED ORDER — EPHEDRINE SULFATE 50 MG/ML IJ SOLN
INTRAMUSCULAR | Status: DC | PRN
  Administered 2019-06-08: 20 mg via INTRAVENOUS
  Administered 2019-06-08: 10 mg via INTRAVENOUS
  Administered 2019-06-08: 20 mg via INTRAVENOUS

## 2019-06-08 MED ORDER — FENTANYL CITRATE (PF) 100 MCG/2ML IJ SOLN
50.0000 ug | INTRAMUSCULAR | Status: DC | PRN
  Administered 2019-06-08: 100 ug via INTRAVENOUS

## 2019-06-08 MED ORDER — PROPOFOL 10 MG/ML IV BOLUS
INTRAVENOUS | Status: DC | PRN
  Administered 2019-06-08: 200 mg via INTRAVENOUS

## 2019-06-08 MED ORDER — PHENYLEPHRINE HCL (PRESSORS) 10 MG/ML IV SOLN
INTRAVENOUS | Status: DC | PRN
  Administered 2019-06-08 (×2): 120 ug via INTRAVENOUS
  Administered 2019-06-08: 80 ug via INTRAVENOUS
  Administered 2019-06-08: 120 ug via INTRAVENOUS
  Administered 2019-06-08: 80 ug via INTRAVENOUS
  Administered 2019-06-08: 200 ug via INTRAVENOUS
  Administered 2019-06-08: 120 ug via INTRAVENOUS
  Administered 2019-06-08: 80 ug via INTRAVENOUS

## 2019-06-08 MED ORDER — FENTANYL CITRATE (PF) 100 MCG/2ML IJ SOLN
25.0000 ug | INTRAMUSCULAR | Status: DC | PRN
  Administered 2019-06-08 (×2): 50 ug via INTRAVENOUS

## 2019-06-08 MED ORDER — DEXAMETHASONE SODIUM PHOSPHATE 10 MG/ML IJ SOLN
INTRAMUSCULAR | Status: DC | PRN
  Administered 2019-06-08: 10 mg via INTRAVENOUS

## 2019-06-08 SURGICAL SUPPLY — 61 items
BANDAGE ACE 3X5.8 VEL STRL LF (GAUZE/BANDAGES/DRESSINGS) IMPLANT
BENZOIN TINCTURE PRP APPL 2/3 (GAUZE/BANDAGES/DRESSINGS) ×2 IMPLANT
BLADE SURG 15 STRL LF DISP TIS (BLADE) ×2 IMPLANT
BLADE SURG 15 STRL SS (BLADE) ×2
BNDG ELASTIC 4X5.8 VLCR STR LF (GAUZE/BANDAGES/DRESSINGS) ×2 IMPLANT
BNDG ESMARK 4X9 LF (GAUZE/BANDAGES/DRESSINGS) ×2 IMPLANT
BNDG GAUZE ELAST 4 BULKY (GAUZE/BANDAGES/DRESSINGS) ×2 IMPLANT
CORD BIPOLAR FORCEPS 12FT (ELECTRODE) ×2 IMPLANT
COVER BACK TABLE REUSABLE LG (DRAPES) ×2 IMPLANT
COVER WAND RF STERILE (DRAPES) IMPLANT
CUFF TOURN SGL QUICK 18X3 (MISCELLANEOUS) ×2 IMPLANT
CUFF TOURN SGL QUICK 18X4 (TOURNIQUET CUFF) ×2 IMPLANT
DECANTER SPIKE VIAL GLASS SM (MISCELLANEOUS) IMPLANT
DRAPE EXTREMITY T 121X128X90 (DISPOSABLE) ×2 IMPLANT
DRAPE SURG 17X23 STRL (DRAPES) ×2 IMPLANT
DRSG EMULSION OIL 3X3 NADH (GAUZE/BANDAGES/DRESSINGS) ×2 IMPLANT
DRSG PAD ABDOMINAL 8X10 ST (GAUZE/BANDAGES/DRESSINGS) ×6 IMPLANT
DURAPREP 26ML APPLICATOR (WOUND CARE) ×2 IMPLANT
GAUZE 4X4 16PLY RFD (DISPOSABLE) IMPLANT
GLOVE BIO SURGEON STRL SZ8.5 (GLOVE) ×2 IMPLANT
GLOVE BIOGEL PI IND STRL 6.5 (GLOVE) ×1 IMPLANT
GLOVE BIOGEL PI IND STRL 7.0 (GLOVE) ×2 IMPLANT
GLOVE BIOGEL PI IND STRL 8.5 (GLOVE) ×2 IMPLANT
GLOVE BIOGEL PI IND STRL 9 (GLOVE) ×1 IMPLANT
GLOVE BIOGEL PI INDICATOR 6.5 (GLOVE) ×1
GLOVE BIOGEL PI INDICATOR 7.0 (GLOVE) ×2
GLOVE BIOGEL PI INDICATOR 8.5 (GLOVE) ×2
GLOVE BIOGEL PI INDICATOR 9 (GLOVE) ×1
GOWN STRL REUS W/ TWL LRG LVL3 (GOWN DISPOSABLE) ×1 IMPLANT
GOWN STRL REUS W/ TWL XL LVL3 (GOWN DISPOSABLE) ×1 IMPLANT
GOWN STRL REUS W/TWL LRG LVL3 (GOWN DISPOSABLE) ×1
GOWN STRL REUS W/TWL XL LVL3 (GOWN DISPOSABLE) ×1
LOOP VESSEL MAXI BLUE (MISCELLANEOUS) ×2 IMPLANT
NEEDLE HYPO 25X1 1.5 SAFETY (NEEDLE) IMPLANT
NS IRRIG 1000ML POUR BTL (IV SOLUTION) ×2 IMPLANT
PACK BASIN DAY SURGERY FS (CUSTOM PROCEDURE TRAY) ×2 IMPLANT
PAD CAST 3X4 CTTN HI CHSV (CAST SUPPLIES) ×1 IMPLANT
PAD CAST 4YDX4 CTTN HI CHSV (CAST SUPPLIES) ×2 IMPLANT
PADDING CAST ABS 4INX4YD NS (CAST SUPPLIES) ×1
PADDING CAST ABS COTTON 4X4 ST (CAST SUPPLIES) ×1 IMPLANT
PADDING CAST COTTON 3X4 STRL (CAST SUPPLIES) ×1
PADDING CAST COTTON 4X4 STRL (CAST SUPPLIES) ×2
SHEET MEDIUM DRAPE 40X70 STRL (DRAPES) ×2 IMPLANT
SLING ARM FOAM STRAP LRG (SOFTGOODS) ×2 IMPLANT
SLING ARM MED ADULT FOAM STRAP (SOFTGOODS) IMPLANT
STOCKINETTE 4X48 STRL (DRAPES) ×2 IMPLANT
STRIP CLOSURE SKIN 1/2X4 (GAUZE/BANDAGES/DRESSINGS) ×2 IMPLANT
SUT ETHIBOND 2 OS 4 DA (SUTURE) IMPLANT
SUT ETHIBOND 3-0 V-5 (SUTURE) IMPLANT
SUT ETHILON 4 0 PS 2 18 (SUTURE) IMPLANT
SUT PROLENE 3 0 PS 2 (SUTURE) IMPLANT
SUT VIC AB 0 CT1 27 (SUTURE) ×1
SUT VIC AB 0 CT1 27XBRD ANBCTR (SUTURE) ×1 IMPLANT
SUT VIC AB 2-0 SH 27 (SUTURE) ×1
SUT VIC AB 2-0 SH 27XBRD (SUTURE) ×1 IMPLANT
SUT VIC AB 3-0 X1 27 (SUTURE) IMPLANT
SUT VICRYL 4-0 PS2 18IN ABS (SUTURE) ×2 IMPLANT
SYR 20CC LL (SYRINGE) ×2 IMPLANT
SYR BULB 3OZ (MISCELLANEOUS) ×2 IMPLANT
TOWEL GREEN STERILE FF (TOWEL DISPOSABLE) ×2 IMPLANT
UNDERPAD 30X30 (UNDERPADS AND DIAPERS) ×2 IMPLANT

## 2019-06-08 NOTE — Discharge Instructions (Signed)
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
## 2019-06-08 NOTE — Anesthesia Postprocedure Evaluation (Signed)
Anesthesia Post Note  Patient: Michael [name]  Procedure(s) Performed: RIGHT ELBOW ULNAR NERVE DECOMPRESSION (Right Elbow)     Patient location during evaluation: PACU Anesthesia Type: General Level of consciousness: awake and alert Pain management: pain level controlled Vital Signs Assessment: post-procedure vital signs reviewed and stable Respiratory status: spontaneous breathing, nonlabored ventilation, respiratory function stable and patient connected to nasal cannula oxygen Cardiovascular status: blood pressure returned to baseline and stable Postop Assessment: no apparent nausea or vomiting Anesthetic complications: no    Last Vitals:  Vitals:   06/08/19 0900 06/08/19 0915  BP: 113/69 109/68  Pulse: 77 89  Resp: 13 13  Temp:    SpO2: 99% 97%    Last Pain:  Vitals:   06/08/19 0915  TempSrc:   PainSc: 7                  Montez Hageman

## 2019-06-08 NOTE — Anesthesia Preprocedure Evaluation (Signed)
Anesthesia Evaluation  Patient identified by MRN, date of birth, ID band Patient awake    Reviewed: Allergy & Precautions, NPO status , Patient's Chart, lab work & pertinent test results  Airway Mallampati: II  TM Distance: >3 FB Neck ROM: Full    Dental no notable dental hx.    Pulmonary asthma , sleep apnea , Current Smoker,    Pulmonary exam normal breath sounds clear to auscultation       Cardiovascular hypertension, Pt. on medications Normal cardiovascular exam Rhythm:Regular Rate:Normal     Neuro/Psych negative neurological ROS  negative psych ROS   GI/Hepatic GERD  ,(+) Cirrhosis       ,   Endo/Other  negative endocrine ROSdiabetes  Renal/GU negative Renal ROS  negative genitourinary   Musculoskeletal negative musculoskeletal ROS (+)   Abdominal   Peds negative pediatric ROS (+)  Hematology  (+) Blood dyscrasia (thrombocytopenia), anemia ,   Anesthesia Other Findings   Reproductive/Obstetrics negative OB ROS                             Anesthesia Physical Anesthesia Plan  ASA: II  Anesthesia Plan: General   Post-op Pain Management:    Induction: Intravenous  PONV Risk Score and Plan: 1 and Ondansetron and Treatment may vary due to age or medical condition  Airway Management Planned: LMA  Additional Equipment:   Intra-op Plan:   Post-operative Plan:   Informed Consent: I have reviewed the patients History and Physical, chart, labs and discussed the procedure including the risks, benefits and alternatives for the proposed anesthesia with the patient or authorized representative who has indicated his/her understanding and acceptance.     Dental advisory given  Plan Discussed with: CRNA  Anesthesia Plan Comments:         Anesthesia Quick Evaluation

## 2019-06-08 NOTE — Interval H&P Note (Signed)
History and Physical Interval Note:  06/08/2019 7:41 AM  Michael [name]  has presented today for surgery, with the diagnosis of severe right elbow ulnar neuropathy.  The various methods of treatment have been discussed with the patient and family. After consideration of risks, benefits and other options for treatment, the patient has consented to  Procedure(s): RIGHT ELBOW ULNAR NERVE DECOMPRESSION (Right) as a surgical intervention.  The patient's history has been reviewed, patient examined, no change in status, stable for surgery.  I have reviewed the patient's chart and labs.  Questions were answered to the patient's satisfaction.     Basil Dess

## 2019-06-08 NOTE — Brief Op Note (Signed)
06/08/2019  8:54 AM  PATIENT:  Michael [name]  53 y.o. male  PRE-OPERATIVE DIAGNOSIS:  severe right elbow ulnar neuropathy  POST-OPERATIVE DIAGNOSIS:  severe right elbow ulnar neuropathy  PROCEDURE:  Procedure(s): RIGHT ELBOW ULNAR NERVE DECOMPRESSION (Right)  SURGEON:  Surgeon(s) and Role:    * Jessy Oto, MD - Primary  ANESTHESIA:   local and general, Dr. Marcell Barlow.  EBL:  25CC   BLOOD ADMINISTERED:none  DRAINS: none   LOCAL MEDICATIONS USED:  MARCAINE 0.5%   and Amount: 15 ml  SPECIMEN:  No Specimen  DISPOSITION OF SPECIMEN:  N/A  COUNTS:  YES  TOURNIQUET:   Total Tourniquet Time Documented: Upper Arm (Right) - 13 minutes Total: Upper Arm (Right) - 13 minutes   DICTATION: .Viviann Spare Dictation  PLAN OF CARE: Discharge to home after PACU  PATIENT DISPOSITION:  PACU - hemodynamically stable.   Delay start of Pharmacological VTE agent (>24hrs) due to surgical blood loss or risk of bleeding: yes

## 2019-06-08 NOTE — Transfer of Care (Signed)
Immediate Anesthesia Transfer of Care Note  Patient: Michael [name]  Procedure(s) Performed: RIGHT ELBOW ULNAR NERVE DECOMPRESSION (Right Elbow)  Patient Location: PACU  Anesthesia Type:General  Level of Consciousness: awake, alert  and oriented  Airway & Oxygen Therapy: Patient Spontanous Breathing and Patient connected to face mask oxygen  Post-op Assessment: Report given to RN and Post -op Vital signs reviewed and stable  Post vital signs: Reviewed and stable  Last Vitals:  Vitals Value Taken Time  BP    Temp    Pulse    Resp    SpO2      Last Pain:  Vitals:   06/08/19 0707  TempSrc: Oral  PainSc: 0-No pain         Complications: No apparent anesthesia complications

## 2019-06-08 NOTE — Op Note (Signed)
° ° ° °  06/08/2019  9:41 AM  PATIENT:  Michael [name]  53 y.o. male  MRN: [id number]  OPERATIVE REPORT  PRE-OPERATIVE DIAGNOSIS:  severe right elbow ulnar neuropathy  POST-OPERATIVE DIAGNOSIS:  severe right elbow ulnar neuropathy  PROCEDURE:  Procedure(s): RIGHT ELBOW ULNAR NERVE DECOMPRESSION    SURGEON:  Jessy Oto, MD     ANESTHESIA:  General, supplemented with local anesthesia, marcaine 0.5% total 15 CC, Dr. Marcell Barlow.     COMPLICATIONS:  None.     TOURNIQUET TIME: 15 min at 250m Hg.  PROCEDURE: The patient was met in the holding area, and the appropriate right elbow identified and marked with an "X" and my initials. The patient received a general anesthesia by anesthesia with LMA.     The patient was then transported to OR and was placed on the operative table in a supine position.The right upper extremity was then prepped using sterile conditions with duraprep from the fingertips to the right upper arm and draped using sterile technique. Time-out procedure was called and correct right upper extremity the was elevated exsanguinated with an Esmarch bandage and tourniquet inflated to 250 mm mercury. Using loope magnification and head lamp. Attention then turned to the right elbow, with the elbow flexed and externally rotated an incision was made approximately 10 cm in length extending from proximal to the medial epicondyle in mind and with the medial epicondyle and then extending to the proximal forearm. Incision through skin subcutaneous layers directly down to the superficial fascia overlying the medial triceps and overlying the medial epicondyle and the superficial fascia to the volar proximal compartment of the forearm. And the fascia along the medial aspect of the distal triceps was then incised using Metzenbaum scissors the ulnar nerve identified. The fascia was then incised extending proximally over the superficial aspect of the ulnar nerve and the  nerve felt to be free. Small bands of fibrous tissue were released and this area and the nerve freed to the region of the arcade of Struthers. The nerve was then freed up circumferentially as proximal to the cubital tunnel. Releasing of the nerve with dissection and division of the fiberous tissue superficial to the nerve as it entered the cubital tunnel. The tissue superficial to the ulnar nerve was carefully freed and then divided using Metzenbaum scissors from proximal to distal the superficial portion of the flexor compartment and size. Tourniquet was released small bleeders along the distal bed of the ulnar were cauterized using bipolar cautery. Irrigation was carried out and the incision then closed using subcutaneous 3-0 Vicryl sutures and a running subcutaneous suture of 4-O vicryl. Tincture of benzoin and steristrips then applied to the right elbow incision site. Well-padded dressing using Adaptic 4 x 4's ABDs fixed to the elbow with sterile webril.  A well padded posterior splint applied to the right arm from mid metacarpal level to the upper arm elbow at 80 degrees flexion. The patient reactivated and returned to the PACU in good condition. All instruments and sponge counts were correct.          JBasil Dess 06/08/2019, 9:41 AM

## 2019-06-08 NOTE — Anesthesia Procedure Notes (Signed)
Procedure Name: LMA Insertion Performed by: Verita Lamb, CRNA Pre-anesthesia Checklist: Patient identified, Emergency Drugs available, Suction available, Patient being monitored and Timeout performed Patient Re-evaluated:Patient Re-evaluated prior to induction Oxygen Delivery Method: Circle system utilized Preoxygenation: Pre-oxygenation with 100% oxygen Induction Type: IV induction LMA: LMA flexible inserted LMA Size: 5.0 Placement Confirmation: CO2 detector,  positive ETCO2 and breath sounds checked- equal and bilateral Tube secured with: Tape Dental Injury: Teeth and Oropharynx as per pre-operative assessment

## 2019-06-09 ENCOUNTER — Encounter (HOSPITAL_BASED_OUTPATIENT_CLINIC_OR_DEPARTMENT_OTHER): Payer: Self-pay | Admitting: Specialist

## 2019-06-16 ENCOUNTER — Encounter: Payer: Self-pay | Admitting: Specialist

## 2019-06-16 ENCOUNTER — Other Ambulatory Visit: Payer: Self-pay | Admitting: Specialist

## 2019-06-16 NOTE — Telephone Encounter (Signed)
Oxycodone 5 mg refill request---pt is supposed to be in Pain management

## 2019-06-16 NOTE — Telephone Encounter (Signed)
JN pt 

## 2019-06-21 ENCOUNTER — Other Ambulatory Visit: Payer: Self-pay | Admitting: Specialist

## 2019-06-24 ENCOUNTER — Encounter: Payer: Self-pay | Admitting: Surgery

## 2019-06-24 ENCOUNTER — Other Ambulatory Visit: Payer: Self-pay

## 2019-06-24 ENCOUNTER — Ambulatory Visit (INDEPENDENT_AMBULATORY_CARE_PROVIDER_SITE_OTHER): Payer: BLUE CROSS/BLUE SHIELD | Admitting: Surgery

## 2019-06-24 VITALS — BP 106/68 | HR 77 | Ht 70.5 in | Wt 198.0 lb

## 2019-06-24 DIAGNOSIS — Z9889 Other specified postprocedural states: Secondary | ICD-10-CM

## 2019-06-24 MED ORDER — HYDROCODONE-ACETAMINOPHEN 10-325 MG PO TABS: 1.0000 | ORAL_TABLET | Freq: Four times a day (QID) | ORAL | 0 refills | Status: DC | PRN

## 2019-06-24 NOTE — Progress Notes (Signed)
53 year old white male who is about 2 weeks out from right elbow ulnar nerve decompression returns.  States that he is doing well.  Arm pain and numbness and tingling improved.  Exam Wound looks good.  Steri-Strips were removed and new ones were applied.  No drainage or signs infection.  Does have some swelling although not extreme.  He does have good elbow range of motion.  Plan Advised patient no aggressive activity with his right arm.  No lifting, pushing, pulling.  Can work on gentle range of motion only.  No resistance.  Follow-up in 2 weeks for recheck.  New Steri-Strips applied today.

## 2019-07-08 ENCOUNTER — Other Ambulatory Visit: Payer: Self-pay

## 2019-07-08 ENCOUNTER — Encounter: Payer: Self-pay | Admitting: Surgery

## 2019-07-08 ENCOUNTER — Ambulatory Visit (INDEPENDENT_AMBULATORY_CARE_PROVIDER_SITE_OTHER): Payer: BC Managed Care – PPO | Admitting: Surgery

## 2019-07-08 VITALS — Ht 70.5 in | Wt 198.0 lb

## 2019-07-08 DIAGNOSIS — M79675 Pain in left toe(s): Secondary | ICD-10-CM

## 2019-07-08 DIAGNOSIS — Z9889 Other specified postprocedural states: Secondary | ICD-10-CM

## 2019-07-08 NOTE — Progress Notes (Signed)
53 year old white male who is about 4 weeks status post right ulnar nerve decompression returns.  States that his arm is doing well.  He continues to improve and is very pleased with his surgery result of this point.  Some residual numbness and tingling in the ulnar aspect of his hand but otherwise doing great.  New complaint today of left great toe pain at the MTP joint.  No injury.  Pain with ambulation and toe dorsiflexion.  Exam very pleasant white male alert and oriented in no acute distress.  Surgical incision right elbow healed well.  No swelling or bruising.  He has full elbow range of motion.  Left great toe is somewhat tender at the MTP joint.  Palpable spurs dorsal MTP joint.  No swelling redness or signs of infection.  Plan Patient doing well from right ulnar nerve decompression.  Follow-up in 4 weeks for recheck.  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  We did discuss conservative treatment with MTP intra-articular injection.  He will let us know how he feels in a few weeks and we may consider that at that time but we would also need to get x-rays of his foot which I did not do today.

## 2019-07-22 ENCOUNTER — Ambulatory Visit: Payer: BC Managed Care – PPO | Admitting: Surgery

## 2019-08-05 ENCOUNTER — Ambulatory Visit (INDEPENDENT_AMBULATORY_CARE_PROVIDER_SITE_OTHER): Payer: BC Managed Care – PPO | Admitting: Surgery

## 2019-08-05 ENCOUNTER — Encounter: Payer: Self-pay | Admitting: Surgery

## 2019-08-05 ENCOUNTER — Ambulatory Visit: Payer: BC Managed Care – PPO

## 2019-08-05 ENCOUNTER — Other Ambulatory Visit: Payer: Self-pay

## 2019-08-05 VITALS — Ht 70.5 in | Wt 198.0 lb

## 2019-08-05 DIAGNOSIS — Z9889 Other specified postprocedural states: Secondary | ICD-10-CM

## 2019-08-05 DIAGNOSIS — M79675 Pain in left toe(s): Secondary | ICD-10-CM
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
## 2019-08-07 MED ORDER — BUPIVACAINE HCL 0.5 % IJ SOLN
0.5000 mL | INTRAMUSCULAR | Status: AC | PRN
  Administered 2019-08-05: .5 mL via INTRA_ARTICULAR

## 2019-08-07 MED ORDER — METHYLPREDNISOLONE ACETATE 40 MG/ML IJ SUSP
20.0000 mg | INTRAMUSCULAR | Status: AC | PRN
  Administered 2019-08-05: 20 mg via INTRA_ARTICULAR

## 2019-09-17 ENCOUNTER — Telehealth: Payer: Self-pay | Admitting: Specialist

## 2019-09-17 NOTE — Telephone Encounter (Signed)
I called and advised per Dr. Louanne Skye he needs to get meds from Pain Management

## 2019-09-17 NOTE — Telephone Encounter (Signed)
Spoke with patient he advised needing Rx refilled (Hydrocodone)  Patient asked if he can get enough for a week and 1/2. The number to contact patient is [phone]

## 2019-10-01 ENCOUNTER — Ambulatory Visit: Payer: Self-pay

## 2019-10-01 ENCOUNTER — Ambulatory Visit (INDEPENDENT_AMBULATORY_CARE_PROVIDER_SITE_OTHER): Payer: BC Managed Care – PPO | Admitting: Surgery

## 2019-10-01 ENCOUNTER — Other Ambulatory Visit: Payer: Self-pay

## 2019-10-01 ENCOUNTER — Encounter: Payer: Self-pay | Admitting: Surgery

## 2019-10-01 DIAGNOSIS — M533 Sacrococcygeal disorders, not elsewhere classified: Secondary | ICD-10-CM | POA: Diagnosis not present

## 2019-10-01 DIAGNOSIS — M545 Low back pain, unspecified: Secondary | ICD-10-CM

## 2019-10-01 DIAGNOSIS — G8929 Other chronic pain: Secondary | ICD-10-CM
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
## 2019-10-02 ENCOUNTER — Ambulatory Visit (INDEPENDENT_AMBULATORY_CARE_PROVIDER_SITE_OTHER): Payer: BC Managed Care – PPO | Admitting: Family Medicine

## 2019-10-02 ENCOUNTER — Encounter: Payer: Self-pay | Admitting: Family Medicine

## 2019-10-02 ENCOUNTER — Ambulatory Visit: Payer: Self-pay

## 2019-10-02 DIAGNOSIS — G8929 Other chronic pain: Secondary | ICD-10-CM | POA: Diagnosis not present

## 2019-10-02 DIAGNOSIS — M533 Sacrococcygeal disorders, not elsewhere classified: Secondary | ICD-10-CM

## 2019-10-02 NOTE — Progress Notes (Signed)
Subjective: He is here for ultrasound-guided left SI joint injection.  Objective: He is tender directly over the left SI joint.  Pain with standing upright and with hyperextension, better with sitting.  Procedure: Ultrasound-guided left SI joint injection: After sterile prep with Betadine, injected 8 cc 1% lidocaine without epinephrine and 40 mg methylprednisolone into the SI joint, injectate was seen filling the SI area.  Patient had good but not complete relief during the immediate anesthetic phase.  He will follow up as directed.

## 2019-10-20 ENCOUNTER — Telehealth: Payer: Self-pay | Admitting: *Deleted

## 2019-10-20 NOTE — Telephone Encounter (Signed)
Please advise patient that I can't  just schedule an injection for his back.  Schedule return office visit with Dr. Louanne Skye.  He will likely need imaging studies before any injections in his spine and that is only if Dr. Louanne Skye feels that these are indicated.

## 2019-10-21 ENCOUNTER — Telehealth: Payer: Self-pay | Admitting: *Deleted

## 2019-10-21 NOTE — Telephone Encounter (Signed)
Pretty far back but if no trauma etc and hee feels it is the same problem and injection helped then ok, otherwise OV

## 2019-10-21 NOTE — Telephone Encounter (Signed)
Yes he needs to get that through him and may actually want to wait for results of MRI

## 2019-10-21 NOTE — Telephone Encounter (Signed)
Patient would like to get an MRI of his back done? Please advise

## 2019-10-21 NOTE — Telephone Encounter (Signed)
Pt is scheduled 11/17/2019 for an OV. Pt did mention that he would like to have an MRI scheduled. I sent a message to Dr. Louanne Skye to advise on scheduling for MRI.

## 2019-10-21 NOTE — Telephone Encounter (Signed)
Per Dr. Ernestina Patches, Called pt and cancelled appt until pt sees Dr Louanne Skye and gets MRI scheduled.

## 2019-10-27 NOTE — Telephone Encounter (Signed)
I called and advised patient that we cannot order until he has been seen

## 2019-10-27 NOTE — Telephone Encounter (Signed)
Can not order an expensive test like an MRI without an evaluation and a reason other than you or I want it. Michael [name]

## 2019-11-04 ENCOUNTER — Ambulatory Visit: Payer: BC Managed Care – PPO | Admitting: Surgery

## 2019-11-04 ENCOUNTER — Ambulatory Visit: Payer: BC Managed Care – PPO | Admitting: Specialist

## 2019-11-09 ENCOUNTER — Other Ambulatory Visit: Payer: Self-pay

## 2019-11-09 ENCOUNTER — Encounter: Payer: Self-pay | Admitting: Specialist

## 2019-11-09 ENCOUNTER — Ambulatory Visit (INDEPENDENT_AMBULATORY_CARE_PROVIDER_SITE_OTHER): Payer: BC Managed Care – PPO

## 2019-11-09 ENCOUNTER — Ambulatory Visit: Payer: Self-pay

## 2019-11-09 ENCOUNTER — Ambulatory Visit (INDEPENDENT_AMBULATORY_CARE_PROVIDER_SITE_OTHER): Payer: BC Managed Care – PPO | Admitting: Specialist

## 2019-11-09 VITALS — BP 98/70 | HR 85 | Ht 70.0 in | Wt 206.0 lb

## 2019-11-09 DIAGNOSIS — M546 Pain in thoracic spine: Secondary | ICD-10-CM

## 2019-11-09 DIAGNOSIS — Z981 Arthrodesis status: Secondary | ICD-10-CM | POA: Diagnosis not present

## 2019-11-09 DIAGNOSIS — M5442 Lumbago with sciatica, left side: Secondary | ICD-10-CM | POA: Diagnosis not present

## 2019-11-09 DIAGNOSIS — M533 Sacrococcygeal disorders, not elsewhere classified: Secondary | ICD-10-CM
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
## 2019-11-09 NOTE — Patient Instructions (Signed)
Avoid bending, stooping and avoid lifting weights greater than 10 lbs. Avoid prolong standing and walking. Avoid frequent bending and stooping  No lifting greater than 10 lbs. May use ice or moist heat for pain. Weight loss is of benefit. Handicap license is approved. Dr. Romona Curls secretary/Assistant will call to arrange for left SI joint steroid injection  MRI of the lumbar spine for assessment of source of increased pain with right L5 weakness and left Lateral thigh sciatica nearly 18 months post op L4-S1 fusion.

## 2019-11-17 ENCOUNTER — Ambulatory Visit: Payer: BC Managed Care – PPO | Admitting: Physical Medicine and Rehabilitation

## 2019-11-23 ENCOUNTER — Telehealth: Payer: Self-pay | Admitting: Family Medicine

## 2019-11-23 NOTE — Telephone Encounter (Signed)
Patient called. Says Dr. Louanne Skye was suppose to put an order in for a MRI but he has not heard anything. Would like to know if he is going to have MRI done. His call back number is  [phone]

## 2019-11-23 NOTE — Telephone Encounter (Signed)
Patient called. Says Dr. Louanne Skye was suppose to put an order in for a MRI but he has not heard anything. Would like to know if he is going to have MRI done.----Please advise

## 2019-11-24 ENCOUNTER — Encounter: Payer: Self-pay | Admitting: Specialist

## 2019-11-26 ENCOUNTER — Encounter: Payer: Self-pay | Admitting: Physical Medicine and Rehabilitation

## 2019-11-30 ENCOUNTER — Encounter: Payer: Self-pay | Admitting: Specialist

## 2019-12-02 ENCOUNTER — Other Ambulatory Visit: Payer: Self-pay

## 2019-12-02 ENCOUNTER — Encounter: Payer: Self-pay | Admitting: Physical Medicine and Rehabilitation

## 2019-12-02 ENCOUNTER — Ambulatory Visit (INDEPENDENT_AMBULATORY_CARE_PROVIDER_SITE_OTHER): Payer: BC Managed Care – PPO | Admitting: Physical Medicine and Rehabilitation

## 2019-12-02 ENCOUNTER — Ambulatory Visit: Payer: Self-pay

## 2019-12-02 DIAGNOSIS — M461 Sacroiliitis, not elsewhere classified: Secondary | ICD-10-CM
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
## 2019-12-02 NOTE — Progress Notes (Signed)
.  Numeric Pain Rating Scale and Functional Assessment Average Pain 4   In the last MONTH (on 0-10 scale) has pain interfered with the following?  1. General activity like being  able to carry out your everyday physical activities such as walking, climbing stairs, carrying groceries, or moving a chair?  Rating(5)   +Dye Allergies(Contrast Media).  Stated he could have contrast times small amounts.

## 2019-12-03 MED ORDER — BUPIVACAINE HCL 0.5 % IJ SOLN
2.0000 mL | INTRAMUSCULAR | Status: AC | PRN
  Administered 2019-12-02: 2 mL via INTRA_ARTICULAR

## 2019-12-03 MED ORDER — METHYLPREDNISOLONE ACETATE 40 MG/ML IJ SUSP
40.0000 mg | INTRAMUSCULAR | Status: AC | PRN
  Administered 2019-12-02: 40 mg via INTRA_ARTICULAR

## 2019-12-03 NOTE — Telephone Encounter (Signed)
MRI of the lumbar spine ordered, I was hoping the SI joint injection would reduce the pain but since it has been  A while since last MRI I have ordered with and without contrast.

## 2019-12-04 ENCOUNTER — Ambulatory Visit: Payer: BC Managed Care – PPO | Admitting: Physical Medicine and Rehabilitation

## 2019-12-14 ENCOUNTER — Other Ambulatory Visit: Payer: Self-pay | Admitting: Specialist

## 2019-12-14 ENCOUNTER — Telehealth: Payer: Self-pay | Admitting: *Deleted

## 2019-12-14 DIAGNOSIS — Z981 Arthrodesis status: Secondary | ICD-10-CM

## 2019-12-14 DIAGNOSIS — M5442 Lumbago with sciatica, left side: Secondary | ICD-10-CM

## 2019-12-14 NOTE — Telephone Encounter (Signed)
Ok thanks.  I thought that Dr. Otelia Sergeant had taken care of this. Sorry

## 2019-12-14 NOTE — Telephone Encounter (Signed)
Pt called stating he has been waiting on MRI lumbar spine with/without contrast but no order has been put in, I saw where notes were placed about orders so I went ahead and placed the MRI and done it as URGENT so he can get scheduled ASAP. Pt was not happy, says he has been around with getting this scheduled and was told he was placed but nothing there. I told him I would take care of it and put it as urgent.

## 2019-12-18 ENCOUNTER — Ambulatory Visit: Payer: BC Managed Care – PPO | Admitting: Specialist

## 2019-12-20 ENCOUNTER — Encounter: Payer: Self-pay | Admitting: Specialist

## 2019-12-23 ENCOUNTER — Encounter: Payer: Self-pay | Admitting: Specialist

## 2019-12-24 ENCOUNTER — Ambulatory Visit (INDEPENDENT_AMBULATORY_CARE_PROVIDER_SITE_OTHER): Payer: BC Managed Care – PPO | Admitting: Surgery

## 2019-12-24 ENCOUNTER — Encounter: Payer: Self-pay | Admitting: Surgery

## 2019-12-24 ENCOUNTER — Other Ambulatory Visit: Payer: Self-pay

## 2019-12-24 ENCOUNTER — Ambulatory Visit: Payer: Self-pay

## 2019-12-24 VITALS — Ht 70.0 in | Wt 206.0 lb

## 2019-12-24 DIAGNOSIS — M5416 Radiculopathy, lumbar region: Secondary | ICD-10-CM | POA: Diagnosis not present

## 2019-12-24 DIAGNOSIS — Z981 Arthrodesis status: Secondary | ICD-10-CM

## 2019-12-24 DIAGNOSIS — M25561 Pain in right knee: Secondary | ICD-10-CM | POA: Diagnosis not present
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
## 2020-01-05 ENCOUNTER — Encounter: Payer: Self-pay | Admitting: Specialist

## 2020-01-05 ENCOUNTER — Other Ambulatory Visit: Payer: Self-pay | Admitting: Radiology

## 2020-01-05 DIAGNOSIS — Z01812 Encounter for preprocedural laboratory examination: Secondary | ICD-10-CM

## 2020-01-06 ENCOUNTER — Encounter: Payer: Self-pay | Admitting: Radiology

## 2020-01-06 LAB — CREATININE WITH EST GFR
Creat: 1.21 mg/dL (ref 0.70–1.33)
GFR, Est African American: 79 mL/min/{1.73_m2} (ref >=60)
GFR, Est Non African American: 68 mL/min/{1.73_m2} (ref >=60)

## 2020-01-08 ENCOUNTER — Ambulatory Visit: Payer: Self-pay

## 2020-01-08 ENCOUNTER — Ambulatory Visit (INDEPENDENT_AMBULATORY_CARE_PROVIDER_SITE_OTHER): Payer: BC Managed Care – PPO | Admitting: Specialist

## 2020-01-08 ENCOUNTER — Other Ambulatory Visit: Payer: Self-pay

## 2020-01-08 ENCOUNTER — Encounter: Payer: Self-pay | Admitting: Specialist

## 2020-01-08 VITALS — BP 104/68 | HR 82 | Ht 70.0 in | Wt 206.0 lb

## 2020-01-08 DIAGNOSIS — M5442 Lumbago with sciatica, left side: Secondary | ICD-10-CM

## 2020-01-08 DIAGNOSIS — M5416 Radiculopathy, lumbar region: Secondary | ICD-10-CM | POA: Diagnosis not present

## 2020-01-08 DIAGNOSIS — Z981 Arthrodesis status: Secondary | ICD-10-CM

## 2020-01-08 DIAGNOSIS — M25561 Pain in right knee: Secondary | ICD-10-CM | POA: Diagnosis not present

## 2020-01-08 DIAGNOSIS — M533 Sacrococcygeal disorders, not elsewhere classified: Secondary | ICD-10-CM

## 2020-01-08 DIAGNOSIS — M25461 Effusion, right knee: Secondary | ICD-10-CM

## 2020-01-08 MED ORDER — HYDROCODONE-ACETAMINOPHEN 7.5-325 MG PO TABS: 1.0000 | ORAL_TABLET | Freq: Four times a day (QID) | ORAL | 0 refills | Status: DC | PRN

## 2020-01-08 NOTE — Patient Instructions (Addendum)
Avoid frequent bending and stooping  No lifting greater than 10 lbs. May use ice or moist heat for pain. Weight loss is of benefit. Hemp CBD capsules, amazon.com 5,000-7,000 mg per bottle, 60 capsules per bottle, take one capsule twice a day. Cane in the right hand to use with left leg weight bearing. Follow-Up Instructions: No follow-ups on file.  Exercise is important to improve your indurance and does allow people to function better inspite of back pain. MRI right knee due to increased anterior proximal tibia pain and effusion, knee giving out and falls.
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
## 2020-01-12 ENCOUNTER — Encounter: Payer: Self-pay | Admitting: Specialist

## 2020-01-12 ENCOUNTER — Other Ambulatory Visit: Payer: Self-pay

## 2020-01-12 ENCOUNTER — Encounter: Payer: Self-pay | Admitting: Physical Medicine and Rehabilitation

## 2020-01-12 DIAGNOSIS — R202 Paresthesia of skin: Secondary | ICD-10-CM

## 2020-01-12 DIAGNOSIS — I10 Essential (primary) hypertension: Secondary | ICD-10-CM

## 2020-01-21 ENCOUNTER — Telehealth: Payer: Self-pay | Admitting: Specialist

## 2020-01-21 ENCOUNTER — Other Ambulatory Visit: Payer: Self-pay | Admitting: Specialist

## 2020-01-21 NOTE — Telephone Encounter (Signed)
Patient called asked if he can be called with the MRI results? Patient said he is having pain in his shoulder and want to know if he can get an injection in his back ? The number to contact patient is [phone]

## 2020-01-21 NOTE — Telephone Encounter (Signed)
Patient called asked if he can be called with the MRI results? Patient said he is having pain in his shoulder and want to know if he can get an injection in his back ? The number to contact patient is [phone] 

## 2020-01-22 MED ORDER — HYDROCODONE-ACETAMINOPHEN 7.5-325 MG PO TABS: 1.0000 | ORAL_TABLET | Freq: Three times a day (TID) | ORAL | 0 refills | Status: DC

## 2020-01-25 ENCOUNTER — Encounter: Payer: Self-pay | Admitting: Specialist

## 2020-01-27 ENCOUNTER — Encounter: Payer: Self-pay | Admitting: Specialist

## 2020-01-28 ENCOUNTER — Ambulatory Visit: Payer: Self-pay

## 2020-01-28 ENCOUNTER — Ambulatory Visit (INDEPENDENT_AMBULATORY_CARE_PROVIDER_SITE_OTHER): Payer: BC Managed Care – PPO | Admitting: Surgery

## 2020-01-28 ENCOUNTER — Ambulatory Visit: Payer: BC Managed Care – PPO | Admitting: Surgery

## 2020-01-28 ENCOUNTER — Other Ambulatory Visit: Payer: Self-pay

## 2020-01-28 ENCOUNTER — Encounter: Payer: Self-pay | Admitting: Surgery

## 2020-01-28 DIAGNOSIS — M25571 Pain in right ankle and joints of right foot: Secondary | ICD-10-CM

## 2020-01-28 DIAGNOSIS — S8002XA Contusion of left knee, initial encounter: Secondary | ICD-10-CM | POA: Diagnosis not present

## 2020-01-28 DIAGNOSIS — M25562 Pain in left knee: Secondary | ICD-10-CM | POA: Diagnosis not present

## 2020-01-28 DIAGNOSIS — S93401A Sprain of unspecified ligament of right ankle, initial encounter: Secondary | ICD-10-CM
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
## 2020-01-28 NOTE — Telephone Encounter (Signed)
error 

## 2020-02-01 ENCOUNTER — Telehealth: Payer: Self-pay | Admitting: Neurology

## 2020-02-01 NOTE — Telephone Encounter (Signed)
Patient scheduled for EMG BLE on 02/02/20 but reports he sprained his ankle pretty badly last week.   Okay to keep appointment or does he need to reschedule?

## 2020-02-01 NOTE — Telephone Encounter (Signed)
Advised patient he can still have EMG with sprained ankle.

## 2020-02-02 ENCOUNTER — Encounter: Payer: Self-pay | Admitting: Specialist

## 2020-02-02 ENCOUNTER — Ambulatory Visit (INDEPENDENT_AMBULATORY_CARE_PROVIDER_SITE_OTHER): Payer: BC Managed Care – PPO | Admitting: Neurology

## 2020-02-02 ENCOUNTER — Other Ambulatory Visit: Payer: Self-pay

## 2020-02-02 DIAGNOSIS — M5417 Radiculopathy, lumbosacral region: Secondary | ICD-10-CM

## 2020-02-02 DIAGNOSIS — R202 Paresthesia of skin: Secondary | ICD-10-CM | POA: Diagnosis not present

## 2020-02-02 NOTE — Procedures (Signed)
[facility]  [address], [place]  [place], [place] [address] Tel: [phone] Fax:  [phone] Test Date:  02/02/2020  Patient: Michael [name] DOB: [birth year] Physician: Narda Amber, DO  Sex: Male Height: 5\' 10"  Ref Phys: Basil Dess, MD  ID#: [id number] Temp: 33.0C Technician:    Patient Complaints: This is a 54 year old man with history of L4-S1 fusion referred for evaluation of bilateral leg pain, worse on the right.  NCV & EMG Findings: Extensive electrodiagnostic testing of the right lower extremity and additional studies of the left shows:  1. Bilateral sural and superficial peroneal sensory responses are within normal limits. 2. Bilateral peroneal motor responses at the extensor digitorum brevis are absent, and normal at the tibialis anterior.  Bilateral tibial motor responses are within normal limits. 3. Bilateral tibial H reflex studies are within normal limits. 4. Chronic motor axonal loss changes are seen affecting the right L5 myotome, without accompanied active denervation.  These findings are not present in the left lower extremity.   Impression: 1. Chronic L5 radiculopathy affecting the right lower extremity, moderate. 2. There is no evidence of a sensorimotor polyneuropathy or a left lumbosacral radiculopathy.   ___________________________ Narda Amber, DO    Nerve Conduction Studies Anti Sensory Summary Table   Stim Site NR Peak (ms) Norm Peak (ms) P-T Amp (V) Norm P-T Amp  Left Sup Peroneal Anti Sensory (Ant Lat Mall)  33C  12 cm    2.6 <4.6 5.8 >4  Right Sup Peroneal Anti Sensory (Ant Lat Mall)  33C  12 cm    2.6 <4.6 6.9 >4  Left Sural Anti Sensory (Lat Mall)  33C  Calf    3.6 <4.6 8.5 >4  Right Sural Anti Sensory (Lat Mall)  33C  Calf    3.6 <4.6 8.5 >4   Motor Summary Table   Stim Site NR Onset (ms) Norm Onset (ms) O-P Amp (mV) Norm O-P Amp Site1 Site2 Delta-0 (ms) Dist (cm) Vel (m/s) Norm Vel (m/s)  Left Peroneal Motor  (Ext Dig Brev)  33C  Ankle NR  <6.0  >2.5 B Fib Ankle  0.0  >40  B Fib NR     Poplt B Fib  0.0  >40  Poplt NR            Right Peroneal Motor (Ext Dig Brev)  33C  Ankle NR  <6.0  >2.5 B Fib Ankle  38.0  >40  B Fib NR     Poplt B Fib  8.0  >40  Poplt NR            Left Peroneal TA Motor (Tib Ant)  33C  Fib Head    2.9 <4.5 6.7 >3 Poplit Fib Head 1.9 8.0 42 >40  Poplit    4.8  6.6         Right Peroneal TA Motor (Tib Ant)  33C  Fib Head    4.3 <4.5 6.7 >3 Poplit Fib Head 1.8 8.0 44 >40  Poplit    6.1  6.6         Left Tibial Motor (Abd Hall Brev)  33C  Ankle    5.1 <6.0 10.0 >4 Knee Ankle 10.2 42.0 41 >40  Knee    15.3  6.3         Right Tibial Motor (Abd Hall Brev)  33C  Ankle    4.9 <6.0 13.8 >4 Knee Ankle 9.6 42.0 44 >40  Knee    14.5  8.7  H Reflex Studies   NR H-Lat (ms) Lat Norm (ms) L-R H-Lat (ms)  Left Tibial (Gastroc)  33C     33.47 <35 0.00  Right Tibial (Gastroc)  33C     33.47 <35 0.00   EMG   Side Muscle Ins Act Fibs Psw Fasc Number Recrt Dur Dur. Amp Amp. Poly Poly. Comment  Right AntTibialis Nml Nml Nml Nml 1- Rapid Some 1+ Some 1+ Some 1+ N/A  Right Gastroc Nml Nml Nml Nml Nml Nml Nml Nml Nml Nml Nml Nml N/A  Right Flex Dig Long Nml Nml Nml Nml 1- Rapid Some 1+ Some 1+ Some 1+ N/A  Right RectFemoris Nml Nml Nml Nml Nml Nml Nml Nml Nml Nml Nml Nml N/A  Right GluteusMed Nml Nml Nml Nml 1- Rapid Some 1+ Some 1+ Some 1+ N/A  Left AntTibialis Nml Nml Nml Nml Nml Nml Nml Nml Nml Nml Nml Nml N/A  Left Gastroc Nml Nml Nml Nml Nml Nml Nml Nml Nml Nml Nml Nml N/A  Left Flex Dig Long Nml Nml Nml Nml Nml Nml Nml Nml Nml Nml Nml Nml N/A  Left RectFemoris Nml Nml Nml Nml Nml Nml Nml Nml Nml Nml Nml Nml N/A  Left GluteusMed Nml Nml Nml Nml Nml Nml Nml Nml Nml Nml Nml Nml N/A      Waveforms:

## 2020-02-03 ENCOUNTER — Encounter: Payer: Self-pay | Admitting: Specialist

## 2020-02-07 ENCOUNTER — Other Ambulatory Visit: Payer: Self-pay | Admitting: Specialist

## 2020-02-08 MED ORDER — HYDROCODONE-ACETAMINOPHEN 7.5-325 MG PO TABS: 1.0000 | ORAL_TABLET | Freq: Three times a day (TID) | ORAL | 0 refills | Status: DC

## 2020-02-16 ENCOUNTER — Encounter: Payer: Self-pay | Admitting: Surgery

## 2020-02-17 ENCOUNTER — Ambulatory Visit: Payer: BC Managed Care – PPO | Admitting: Specialist

## 2020-02-17 ENCOUNTER — Other Ambulatory Visit: Payer: Self-pay | Admitting: Specialist

## 2020-02-17 ENCOUNTER — Telehealth: Payer: Self-pay | Admitting: Specialist

## 2020-02-17 ENCOUNTER — Encounter: Payer: Self-pay | Admitting: Specialist

## 2020-02-17 NOTE — Telephone Encounter (Signed)
I called and scheduled patient for 03/10/2020, I advised that there was nothing sooner, and that I would put him on the cancellation list and call him if something opened up sooner. He agreed to this.

## 2020-02-17 NOTE — Telephone Encounter (Signed)
Patient called.  He canceled his appointment for today but asked to be rescheduled. I told him the next available was 4/15 and he requested that I ask christy to get him in sooner.   Call back: [phone]

## 2020-02-18 ENCOUNTER — Encounter: Payer: Self-pay | Admitting: Specialist

## 2020-02-18 ENCOUNTER — Ambulatory Visit (INDEPENDENT_AMBULATORY_CARE_PROVIDER_SITE_OTHER): Payer: BC Managed Care – PPO | Admitting: Specialist

## 2020-02-18 ENCOUNTER — Other Ambulatory Visit: Payer: Self-pay

## 2020-02-18 ENCOUNTER — Telehealth: Payer: Self-pay | Admitting: *Deleted

## 2020-02-18 VITALS — BP 102/69 | HR 91 | Ht 70.5 in | Wt 222.0 lb

## 2020-02-18 DIAGNOSIS — S8002XA Contusion of left knee, initial encounter: Secondary | ICD-10-CM

## 2020-02-18 DIAGNOSIS — Z981 Arthrodesis status: Secondary | ICD-10-CM

## 2020-02-18 DIAGNOSIS — S93491S Sprain of other ligament of right ankle, sequela: Secondary | ICD-10-CM

## 2020-02-18 DIAGNOSIS — M25562 Pain in left knee: Secondary | ICD-10-CM | POA: Diagnosis not present

## 2020-02-18 DIAGNOSIS — M25571 Pain in right ankle and joints of right foot: Secondary | ICD-10-CM

## 2020-02-18 DIAGNOSIS — M5416 Radiculopathy, lumbar region: Secondary | ICD-10-CM

## 2020-02-18 DIAGNOSIS — S43011A Anterior subluxation of right humerus, initial encounter: Secondary | ICD-10-CM

## 2020-02-18 DIAGNOSIS — M76899 Other specified enthesopathies of unspecified lower limb, excluding foot: Secondary | ICD-10-CM | POA: Diagnosis not present

## 2020-02-18 MED ORDER — HYDROCODONE-ACETAMINOPHEN 7.5-325 MG PO TABS: 1.0000 | ORAL_TABLET | Freq: Three times a day (TID) | ORAL | 0 refills | Status: DC

## 2020-02-18 NOTE — Telephone Encounter (Signed)
JN 
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
## 2020-02-18 NOTE — Patient Instructions (Signed)
  Plan: Avoid bending, stooping and avoid lifting weights greater than 10 lbs. Avoid prolong standing and walking. Avoid frequent bending and stooping  No lifting greater than 10 lbs. May use ice or moist heat for pain. Weight loss is of benefit. Handicap license is approved. Dr. [place] Blas secretary/Assistant will call to arrange for epidural steroid injection  Go to PT for treatment of Quadriceps tendonitis and right high ankle sprain and right shoulder anterior instability.  Follow-Up Instructions: No follow-ups on file.

## 2020-02-22 ENCOUNTER — Other Ambulatory Visit: Payer: Self-pay | Admitting: Physical Medicine and Rehabilitation

## 2020-02-22 DIAGNOSIS — F411 Generalized anxiety disorder: Secondary | ICD-10-CM

## 2020-02-22 MED ORDER — DIAZEPAM 5 MG PO TABS: ORAL_TABLET | ORAL | 0 refills | Status: DC

## 2020-02-22 NOTE — Progress Notes (Signed)
Pre-procedure diazepam ordered for pre-operative anxiety.  

## 2020-02-22 NOTE — Telephone Encounter (Signed)
done

## 2020-02-22 NOTE — Telephone Encounter (Signed)
Called pt and advised.  

## 2020-02-24 ENCOUNTER — Encounter: Payer: Self-pay | Admitting: Specialist

## 2020-03-10 ENCOUNTER — Ambulatory Visit (INDEPENDENT_AMBULATORY_CARE_PROVIDER_SITE_OTHER): Payer: BC Managed Care – PPO | Admitting: Specialist

## 2020-03-10 ENCOUNTER — Ambulatory Visit: Payer: BC Managed Care – PPO | Admitting: Specialist

## 2020-03-10 ENCOUNTER — Other Ambulatory Visit: Payer: Self-pay

## 2020-03-10 ENCOUNTER — Ambulatory Visit: Payer: Self-pay

## 2020-03-10 ENCOUNTER — Encounter: Payer: Self-pay | Admitting: Specialist

## 2020-03-10 VITALS — BP 98/68 | HR 92 | Ht 70.5 in | Wt 222.0 lb

## 2020-03-10 DIAGNOSIS — M75101 Unspecified rotator cuff tear or rupture of right shoulder, not specified as traumatic: Secondary | ICD-10-CM

## 2020-03-10 DIAGNOSIS — S93491S Sprain of other ligament of right ankle, sequela: Secondary | ICD-10-CM | POA: Diagnosis not present

## 2020-03-10 DIAGNOSIS — M25571 Pain in right ankle and joints of right foot: Secondary | ICD-10-CM | POA: Diagnosis not present

## 2020-03-10 MED ORDER — HYDROCODONE-ACETAMINOPHEN 7.5-325 MG PO TABS: 1.0000 | ORAL_TABLET | Freq: Three times a day (TID) | ORAL | 0 refills | Status: DC

## 2020-03-10 MED ORDER — ALPRAZOLAM 0.5 MG PO TABS: ORAL_TABLET | ORAL | 0 refills | Status: DC
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
## 2020-03-10 NOTE — Patient Instructions (Signed)
Use the right ankle ASO for another 2 weeks then begin PT. Ankle soaks warm water BID. Vicodin for pain. Hold on PT for the right ankle due to persistent pain and high ankle sprain signs. If pain persists after 2 weeks then MRI to assess for osteochondral injury vs syndesmotic injury.

## 2020-03-11 ENCOUNTER — Encounter: Payer: Self-pay | Admitting: Specialist

## 2020-03-14 ENCOUNTER — Ambulatory Visit: Payer: Self-pay

## 2020-03-14 ENCOUNTER — Other Ambulatory Visit: Payer: Self-pay

## 2020-03-14 ENCOUNTER — Ambulatory Visit (INDEPENDENT_AMBULATORY_CARE_PROVIDER_SITE_OTHER): Payer: BC Managed Care – PPO | Admitting: Physical Medicine and Rehabilitation

## 2020-03-14 ENCOUNTER — Encounter: Payer: Self-pay | Admitting: Physical Medicine and Rehabilitation

## 2020-03-14 VITALS — BP 133/83 | HR 98

## 2020-03-14 DIAGNOSIS — M5416 Radiculopathy, lumbar region: Secondary | ICD-10-CM | POA: Diagnosis not present

## 2020-03-14 MED ORDER — METHYLPREDNISOLONE ACETATE 80 MG/ML IJ SUSP
40.0000 mg | Freq: Once | INTRAMUSCULAR | Status: AC
  Administered 2020-03-14: 40 mg

## 2020-03-14 NOTE — Progress Notes (Signed)
 .  Numeric Pain Rating Scale and Functional Assessment Average Pain 8   In the last MONTH (on 0-10 scale) has pain interfered with the following?  1. General activity like being  able to carry out your everyday physical activities such as walking, climbing stairs, carrying groceries, or moving a chair?  Rating(8)   +Driver, -BT, -Dye Allergies.  

## 2020-03-15 NOTE — Procedures (Signed)
Lumbosacral Transforaminal Epidural Steroid Injection - Sub-Pedicular Approach with Fluoroscopic Guidance  Patient: Michael [name]      Date of Birth: 1966/07/11 MRN: [id number] PCP: Ermalinda Memos, MD      Visit Date: 03/14/2020   Universal Protocol:    Date/Time: 03/14/2020  Consent Given By: the patient  Position: PRONE  Additional Comments: Vital signs were monitored before and after the procedure. Patient was prepped and draped in the usual sterile fashion. The correct patient, procedure, and site was verified.   Injection Procedure Details:  Procedure Site One Meds Administered:  Meds ordered this encounter  Medications  . methylPREDNISolone acetate (DEPO-MEDROL) injection 40 mg    Laterality: Bilateral  Location/Site:  L5-S1  Needle size: 22 G  Needle type: Spinal  Needle Placement: Transforaminal  Findings:    -Comments: Excellent flow of contrast along the nerve and into the epidural space.  Procedure Details: After squaring off the end-plates to get a true AP view, the C-arm was positioned so that an oblique view of the foramen as noted above was visualized. The target area is just inferior to the "nose of the scotty dog" or sub pedicular. The soft tissues overlying this structure were infiltrated with 2-3 ml. of 1% Lidocaine without Epinephrine.  The spinal needle was inserted toward the target using a "trajectory" view along the fluoroscope beam.  Under AP and lateral visualization, the needle was advanced so it did not puncture dura and was located close the 6 O'Clock position of the pedical in AP tracterory. Biplanar projections were used to confirm position. Aspiration was confirmed to be negative for CSF and/or blood. A 1-2 ml. volume of Isovue-250 was injected and flow of contrast was noted at each level. Radiographs were obtained for documentation purposes.   After attaining the desired flow of contrast documented above, a 0.5 to 1.0 ml test  dose of 0.25% Marcaine was injected into each respective transforaminal space.  The patient was observed for 90 seconds post injection.  After no sensory deficits were reported, and normal lower extremity motor function was noted,   the above injectate was administered so that equal amounts of the injectate were placed at each foramen (level) into the transforaminal epidural space.   Additional Comments:  The patient tolerated the procedure well Dressing: 2 x 2 sterile gauze and Band-Aid    Post-procedure details: Patient was observed during the procedure. Post-procedure instructions were reviewed.  Patient left the clinic in stable condition.
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
## 2020-03-21 ENCOUNTER — Encounter: Payer: Self-pay | Admitting: Radiology

## 2020-03-30 ENCOUNTER — Other Ambulatory Visit: Payer: Self-pay | Admitting: Specialist

## 2020-03-30 MED ORDER — HYDROCODONE-ACETAMINOPHEN 7.5-325 MG PO TABS: 1.0000 | ORAL_TABLET | Freq: Three times a day (TID) | ORAL | 0 refills | Status: DC

## 2020-03-30 NOTE — Telephone Encounter (Signed)
JN pt 

## 2020-03-31 ENCOUNTER — Ambulatory Visit: Payer: BC Managed Care – PPO | Admitting: Specialist

## 2020-04-11 ENCOUNTER — Ambulatory Visit: Payer: BC Managed Care – PPO | Admitting: Specialist

## 2020-04-12 ENCOUNTER — Encounter: Payer: Self-pay | Admitting: Specialist

## 2020-04-13 ENCOUNTER — Other Ambulatory Visit: Payer: BC Managed Care – PPO

## 2020-04-15 ENCOUNTER — Ambulatory Visit: Payer: BC Managed Care – PPO | Admitting: Specialist

## 2020-04-18 ENCOUNTER — Other Ambulatory Visit: Payer: Self-pay | Admitting: Specialist

## 2020-04-19 ENCOUNTER — Other Ambulatory Visit: Payer: Self-pay | Admitting: Specialist

## 2020-04-19 MED ORDER — HYDROCODONE-ACETAMINOPHEN 7.5-325 MG PO TABS: 1.0000 | ORAL_TABLET | Freq: Three times a day (TID) | ORAL | 0 refills | Status: DC

## 2020-04-23 ENCOUNTER — Ambulatory Visit
Admission: RE | Admit: 2020-04-23 | Discharge: 2020-04-23 | Disposition: A | Discharge status: Home / Self Care | Payer: BC Managed Care – PPO | Source: Ambulatory Visit | Attending: Specialist | Admitting: Specialist

## 2020-04-23 ENCOUNTER — Other Ambulatory Visit: Payer: Self-pay

## 2020-04-23 DIAGNOSIS — M75101 Unspecified rotator cuff tear or rupture of right shoulder, not specified as traumatic: Secondary | ICD-10-CM

## 2020-04-28 ENCOUNTER — Ambulatory Visit: Payer: Self-pay

## 2020-04-28 ENCOUNTER — Other Ambulatory Visit: Payer: Self-pay

## 2020-04-28 ENCOUNTER — Ambulatory Visit (INDEPENDENT_AMBULATORY_CARE_PROVIDER_SITE_OTHER): Payer: BC Managed Care – PPO | Admitting: Family Medicine

## 2020-04-28 ENCOUNTER — Encounter: Payer: Self-pay | Admitting: Specialist

## 2020-04-28 VITALS — BP 99/70 | HR 93 | Ht 70.5 in | Wt 213.0 lb

## 2020-04-28 DIAGNOSIS — M25511 Pain in right shoulder: Secondary | ICD-10-CM

## 2020-04-28 DIAGNOSIS — G8929 Other chronic pain: Secondary | ICD-10-CM | POA: Diagnosis not present

## 2020-04-28 NOTE — Progress Notes (Signed)
Subjective: He is here for a diagnostic/therapeutic right shoulder long head biceps tendon injection.  Recent MRI showed possible longitudinal split tear versus bifid long head tendon.  Dr. Otelia Sergeant wants him to have a diagnostic/therapeutic injection.  Objective: He is point tender over the biceps tendon.  Not a lot of tenderness at the [facility] joint.  Procedure: Ultrasound-guided biceps tendon injection: After sterile prep with Betadine, injected 5 cc 1% lidocaine without epinephrine and 40 mg methylprednisolone into the biceps tendon sheath.  He had modest improvement during the immediate anesthetic phase.  He will follow-up with Dr. Otelia Sergeant as scheduled.

## 2020-04-29 ENCOUNTER — Encounter: Payer: Self-pay | Admitting: Radiology

## 2020-05-11 ENCOUNTER — Encounter: Payer: Self-pay | Admitting: Specialist

## 2020-05-16 ENCOUNTER — Other Ambulatory Visit: Payer: Self-pay | Admitting: Radiology

## 2020-05-16 DIAGNOSIS — M25571 Pain in right ankle and joints of right foot: Secondary | ICD-10-CM

## 2020-05-16 DIAGNOSIS — S93491S Sprain of other ligament of right ankle, sequela: Secondary | ICD-10-CM

## 2020-05-16 DIAGNOSIS — M76899 Other specified enthesopathies of unspecified lower limb, excluding foot: Secondary | ICD-10-CM

## 2020-05-19 ENCOUNTER — Ambulatory Visit: Payer: BC Managed Care – PPO | Admitting: Specialist

## 2020-06-24 ENCOUNTER — Ambulatory Visit: Payer: BC Managed Care – PPO | Admitting: Specialist

## 2020-07-06 ENCOUNTER — Other Ambulatory Visit: Payer: Self-pay

## 2020-07-06 ENCOUNTER — Ambulatory Visit (INDEPENDENT_AMBULATORY_CARE_PROVIDER_SITE_OTHER): Payer: BC Managed Care – PPO | Admitting: Specialist

## 2020-07-06 ENCOUNTER — Encounter: Payer: Self-pay | Admitting: Specialist

## 2020-07-06 ENCOUNTER — Ambulatory Visit: Payer: Self-pay

## 2020-07-06 ENCOUNTER — Ambulatory Visit (INDEPENDENT_AMBULATORY_CARE_PROVIDER_SITE_OTHER): Payer: BC Managed Care – PPO

## 2020-07-06 ENCOUNTER — Telehealth: Payer: Self-pay | Admitting: Specialist

## 2020-07-06 VITALS — BP 136/91 | HR 90 | Ht 70.0 in | Wt 212.0 lb

## 2020-07-06 DIAGNOSIS — S93402A Sprain of unspecified ligament of left ankle, initial encounter: Secondary | ICD-10-CM

## 2020-07-06 DIAGNOSIS — M25572 Pain in left ankle and joints of left foot: Secondary | ICD-10-CM

## 2020-07-06 DIAGNOSIS — S80211A Abrasion, right knee, initial encounter: Secondary | ICD-10-CM

## 2020-07-06 DIAGNOSIS — M25571 Pain in right ankle and joints of right foot: Secondary | ICD-10-CM

## 2020-07-06 DIAGNOSIS — S93514A Sprain of interphalangeal joint of right lesser toe(s), initial encounter: Secondary | ICD-10-CM

## 2020-07-06 DIAGNOSIS — M7041 Prepatellar bursitis, right knee: Secondary | ICD-10-CM

## 2020-07-06 DIAGNOSIS — S93504A Unspecified sprain of right lesser toe(s), initial encounter: Secondary | ICD-10-CM

## 2020-07-06 DIAGNOSIS — M25561 Pain in right knee: Secondary | ICD-10-CM

## 2020-07-06 DIAGNOSIS — S93501A Unspecified sprain of right great toe, initial encounter: Secondary | ICD-10-CM

## 2020-07-06 DIAGNOSIS — IMO0001 Reserved for inherently not codable concepts without codable children: Secondary | ICD-10-CM

## 2020-07-06 MED ORDER — HYDROCODONE-ACETAMINOPHEN 7.5-325 MG PO TABS: 1.0000 | ORAL_TABLET | Freq: Four times a day (QID) | ORAL | 0 refills | Status: DC | PRN

## 2020-07-06 NOTE — Telephone Encounter (Signed)
Holding for [place] for cancellation list.

## 2020-07-06 NOTE — Telephone Encounter (Signed)
Spoke with patient he is scheduled for 08/12/2020 and asked to be added to the wait list for a earlier appointment, The number to contact patient is [phone]
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
## 2020-07-06 NOTE — Patient Instructions (Signed)
Right knee abrasion, keep clean, use betadiene and paint daily for 3 days and keep covered with bandaid. Right ankle sprain grade 1 ace wrap and use an ASO lace up for 2-3 weeks then discontinue. Left ankle is a grade 3 injury of the ankle need to use a left ankle brace for 3-4 weeks to allow for the ligament to tighten up as it heals then physical therapy. Able to weight bear as tolerated both legs. The right inner 3 toes are sprung at the MTP joints usually a stiff shoe or flat footed walking for 3-4 weeks is adequate to get these ligaments to heal these areas. If fever or chills or worsening swelling or pain an infection may be occurring of the underlying right knee bursa. Please contact up if these signs or symptoms occur.

## 2020-07-07 NOTE — Telephone Encounter (Signed)
Added to cancellation list 

## 2020-07-14 ENCOUNTER — Telehealth: Payer: Self-pay | Admitting: Specialist

## 2020-07-14 ENCOUNTER — Encounter: Payer: Self-pay | Admitting: Specialist

## 2020-07-14 NOTE — Telephone Encounter (Signed)
Patient called.   Requesting a refill on his hydrocodone.   Call back: [phone]

## 2020-07-14 NOTE — Telephone Encounter (Signed)
Please advise 

## 2020-07-15 ENCOUNTER — Other Ambulatory Visit: Payer: Self-pay | Admitting: Specialist

## 2020-07-15 MED ORDER — HYDROCODONE-ACETAMINOPHEN 7.5-325 MG PO TABS: 1.0000 | ORAL_TABLET | Freq: Four times a day (QID) | ORAL | 0 refills | Status: DC | PRN

## 2020-07-15 NOTE — Telephone Encounter (Signed)
Rx filled, need to consider pain management due to chronic tendency to need some form of narcotic even though this cause is post traumatic.

## 2020-07-15 NOTE — Telephone Encounter (Signed)
Please advise 

## 2020-07-15 NOTE — Telephone Encounter (Signed)
Dr. Nitka pt 

## 2020-07-18 ENCOUNTER — Ambulatory Visit (INDEPENDENT_AMBULATORY_CARE_PROVIDER_SITE_OTHER): Payer: BC Managed Care – PPO | Admitting: Specialist

## 2020-07-18 ENCOUNTER — Ambulatory Visit: Payer: Self-pay

## 2020-07-18 ENCOUNTER — Other Ambulatory Visit: Payer: Self-pay

## 2020-07-18 ENCOUNTER — Encounter: Payer: Self-pay | Admitting: Specialist

## 2020-07-18 VITALS — BP 118/80 | HR 105 | Ht 70.0 in | Wt 212.0 lb

## 2020-07-18 DIAGNOSIS — M79674 Pain in right toe(s): Secondary | ICD-10-CM | POA: Diagnosis not present

## 2020-07-18 DIAGNOSIS — S99911D Unspecified injury of right ankle, subsequent encounter: Secondary | ICD-10-CM
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
## 2020-07-18 NOTE — Patient Instructions (Signed)
Knee abrasion, keep clean. Right ankle sprain grade Use an ASO lace up for 1 weeks then discontinue. Left ankle is a grade 3 injury of the ankle need to use a left ankle brace for 3-4 weeks to allow for the ligament to tighten up as it heals then physical therapy. Able to weight bear as tolerated both legs. The right inner 3 toes are sprung at the MTP joints usually a stiff shoe or flat footed walking for 3-4 weeks is adequate to get these ligaments to heal these areas. If fever or chills or worsening swelling or pain an infection may be occurring of the underlying right knee bursa. Please contact up if these signs or symptoms occur.

## 2020-07-25 ENCOUNTER — Other Ambulatory Visit: Payer: Self-pay | Admitting: Specialist

## 2020-07-25 ENCOUNTER — Encounter: Payer: Self-pay | Admitting: Specialist

## 2020-07-26 MED ORDER — HYDROCODONE-ACETAMINOPHEN 7.5-325 MG PO TABS: 1.0000 | ORAL_TABLET | Freq: Four times a day (QID) | ORAL | 0 refills | Status: DC | PRN

## 2020-07-29 ENCOUNTER — Encounter: Payer: Self-pay | Admitting: Specialist

## 2020-08-04 NOTE — Telephone Encounter (Signed)
Okay 

## 2020-08-05 ENCOUNTER — Other Ambulatory Visit: Payer: Self-pay | Admitting: Specialist

## 2020-08-05 MED ORDER — HYDROCODONE-ACETAMINOPHEN 7.5-325 MG PO TABS: 1.0000 | ORAL_TABLET | Freq: Three times a day (TID) | ORAL | 0 refills | Status: AC

## 2020-08-10 ENCOUNTER — Encounter (HOSPITAL_COMMUNITY): Payer: Self-pay | Admitting: Emergency Medicine

## 2020-08-10 ENCOUNTER — Emergency Department (HOSPITAL_COMMUNITY)
Admission: EM | Admit: 2020-08-10 | Discharge: 2020-08-10 | Disposition: A | Discharge status: Home / Self Care | Payer: BC Managed Care – PPO | Attending: Emergency Medicine | Admitting: Emergency Medicine

## 2020-08-10 ENCOUNTER — Other Ambulatory Visit: Payer: Self-pay

## 2020-08-10 DIAGNOSIS — R220 Localized swelling, mass and lump, head: Secondary | ICD-10-CM | POA: Diagnosis present

## 2020-08-10 DIAGNOSIS — Z5321 Procedure and treatment not carried out due to patient leaving prior to being seen by health care provider: Secondary | ICD-10-CM | POA: Insufficient documentation

## 2020-08-10 LAB — BASIC METABOLIC PANEL
Anion gap: 13 (ref 5–15)
BUN: 8 mg/dL (ref 6–20)
CO2: 19 mmol/L — ABNORMAL LOW (ref 22–32)
Calcium: 9.2 mg/dL (ref 8.9–10.3)
Chloride: 97 mmol/L — ABNORMAL LOW (ref 98–111)
Creatinine, Ser: 0.75 mg/dL (ref 0.61–1.24)
GFR calc Af Amer: >60 mL/min (ref >60)
GFR calc non Af Amer: >60 mL/min (ref >60)
Glucose, Bld: 110 mg/dL — ABNORMAL HIGH (ref 70–99)
Potassium: 4.6 mmol/L (ref 3.5–5.1)
Sodium: 129 mmol/L — ABNORMAL LOW (ref 135–145)

## 2020-08-10 LAB — CBC WITH DIFFERENTIAL/PLATELET
Abs Immature Granulocytes: 0.03 10*3/uL (ref 0.00–0.07)
Basophils Absolute: 0 10*3/uL (ref 0.0–0.1)
Basophils Relative: 0 %
Eosinophils Absolute: 0.2 10*3/uL (ref 0.0–0.5)
Eosinophils Relative: 4 %
HCT: 38.6 % — ABNORMAL LOW (ref 39.0–52.0)
Hemoglobin: 13.3 g/dL (ref 13.0–17.0)
Immature Granulocytes: 1 %
Lymphocytes Relative: 15 %
Lymphs Abs: 0.7 10*3/uL (ref 0.7–4.0)
MCH: 35 pg — ABNORMAL HIGH (ref 26.0–34.0)
MCHC: 34.5 g/dL (ref 30.0–36.0)
MCV: 101.6 fL — ABNORMAL HIGH (ref 80.0–100.0)
Monocytes Absolute: 0.5 10*3/uL (ref 0.1–1.0)
Monocytes Relative: 12 %
Neutro Abs: 3 10*3/uL (ref 1.7–7.7)
Neutrophils Relative %: 68 %
Platelets: 38 10*3/uL — ABNORMAL LOW (ref 150–400)
RBC: 3.8 MIL/uL — ABNORMAL LOW (ref 4.22–5.81)
RDW: 15.5 % (ref 11.5–15.5)
WBC: 4.4 10*3/uL (ref 4.0–10.5)
nRBC: 0 % (ref 0.0–0.2)

## 2020-08-10 NOTE — ED Notes (Signed)
Pt stated they were leaving  

## 2020-08-10 NOTE — ED Triage Notes (Signed)
Patient reports tongue swelling this evening , denies injury , respirations unlabored , airway intact , denies fever.

## 2020-08-12 ENCOUNTER — Other Ambulatory Visit: Payer: Self-pay | Admitting: Radiology

## 2020-08-12 ENCOUNTER — Telehealth: Payer: Self-pay | Admitting: Radiology

## 2020-08-12 ENCOUNTER — Ambulatory Visit: Payer: BC Managed Care – PPO | Admitting: Specialist

## 2020-08-12 ENCOUNTER — Other Ambulatory Visit: Payer: Self-pay

## 2020-08-12 MED ORDER — HYDROCODONE-ACETAMINOPHEN 5-325 MG PO TABS: 1.0000 | ORAL_TABLET | Freq: Three times a day (TID) | ORAL | 0 refills | Status: AC

## 2020-08-12 NOTE — Telephone Encounter (Signed)
Pt came in stating that he he has an appt to be checked for COVID-19 this afternoon, I asked if he is having symptoms he states that he is having drainage from his sinus' to his throat making him cough and he coughs up blood and if it get to his stomach he vomits. I advised that he should not be here to expose [place] to these symptoms and possible to the virus, I advised that we can not see him today due to exposing our office potently.  I advised that if he gets a negative result that he can call me and that I would try to get him in to be seen as soon as I could. He asked that I send a refill request to Dr. Otelia Sergeant for hydrocodone and this has been done.

## 2020-08-14 ENCOUNTER — Encounter: Payer: Self-pay | Admitting: Specialist

## 2020-08-19 ENCOUNTER — Other Ambulatory Visit: Payer: Self-pay

## 2020-08-19 ENCOUNTER — Ambulatory Visit (INDEPENDENT_AMBULATORY_CARE_PROVIDER_SITE_OTHER): Payer: BC Managed Care – PPO | Admitting: Specialist

## 2020-08-19 ENCOUNTER — Encounter: Payer: Self-pay | Admitting: Specialist

## 2020-08-19 VITALS — BP 119/79 | HR 99 | Ht 70.0 in | Wt 212.0 lb

## 2020-08-19 DIAGNOSIS — S99911D Unspecified injury of right ankle, subsequent encounter: Secondary | ICD-10-CM | POA: Diagnosis not present

## 2020-08-19 DIAGNOSIS — S93514A Sprain of interphalangeal joint of right lesser toe(s), initial encounter: Secondary | ICD-10-CM

## 2020-08-19 DIAGNOSIS — M25561 Pain in right knee: Secondary | ICD-10-CM

## 2020-08-19 DIAGNOSIS — M25572 Pain in left ankle and joints of left foot: Secondary | ICD-10-CM

## 2020-08-19 DIAGNOSIS — S93501A Unspecified sprain of right great toe, initial encounter: Secondary | ICD-10-CM | POA: Diagnosis not present

## 2020-08-19 DIAGNOSIS — M79674 Pain in right toe(s): Secondary | ICD-10-CM | POA: Diagnosis not present

## 2020-08-19 DIAGNOSIS — M25571 Pain in right ankle and joints of right foot: Secondary | ICD-10-CM

## 2020-08-19 DIAGNOSIS — S93504A Unspecified sprain of right lesser toe(s), initial encounter: Secondary | ICD-10-CM

## 2020-08-19 MED ORDER — HYDROCODONE-ACETAMINOPHEN 7.5-325 MG PO TABS: 1.0000 | ORAL_TABLET | Freq: Four times a day (QID) | ORAL | 0 refills | Status: DC | PRN
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
## 2020-08-19 NOTE — Patient Instructions (Signed)
Referral to PT in [place]:Bilateral ankle stablization exercises.  5 weeks post right grade one lateral ankle sprain, and left grade 2-3 ankle sprain, walker boot for left now using high top shoe, ASO right ankle now with hightop boot.  Had nondisplaced right great toe, 2nd toe and 3rd toe proximal phalanx medial lip hairline fractures that are stable and  Has soft tissue pain associated with the right multiple toe injury and soft tissue sprain. No immobilization necessary at this time.  Weight bear as tolerated with the bilateral high top shoes.

## 2020-08-25 ENCOUNTER — Encounter: Payer: Self-pay | Admitting: Specialist

## 2020-09-07 ENCOUNTER — Other Ambulatory Visit: Payer: Self-pay | Admitting: Specialist

## 2020-09-07 NOTE — Telephone Encounter (Signed)
Can you please advise on this

## 2020-09-07 NOTE — Telephone Encounter (Signed)
Don't feel comfortable refilling RX given patient's struggles with alcohol abuse.  Defer to Dr. Otelia Sergeant

## 2020-09-13 IMAGING — MR MR SHOULDER*R* W/O CM
4 of 5 series · 21 of 40 positions shown · non-contrast
Comparison: Radiographs dated 01/22/2019

CLINICAL DATA: Chronic right shoulder pain and limited range of
motion and weakness.

EXAM:
MRI OF THE RIGHT SHOULDER WITHOUT CONTRAST
TECHNIQUE: Multiplanar, multisequence MR imaging of the shoulder was performed.
No intravenous contrast was administered.

[Series 5: PD fat-sat · axial · right · 4.0mm · 0.44mm/px · z∈[-46,+45]mm · 8 of 20 slices shown (1 of 2)]
[im 1/20]
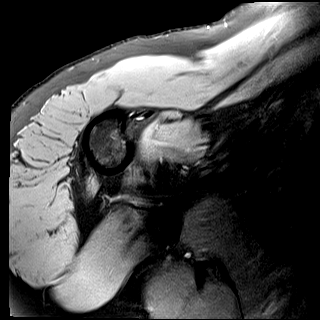
[im 3/20]
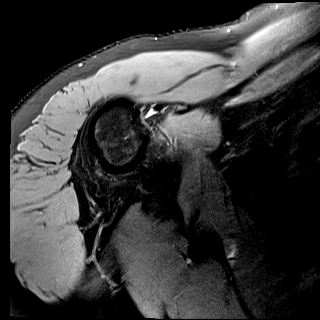
[im 7/20]
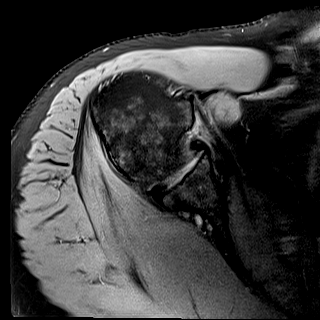
[im 9/20]
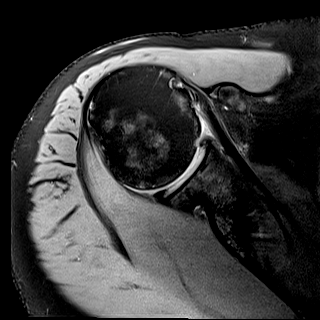
[im 11/20]
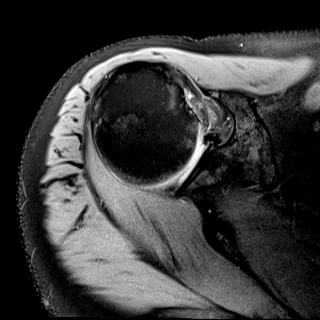
[im 13/20]
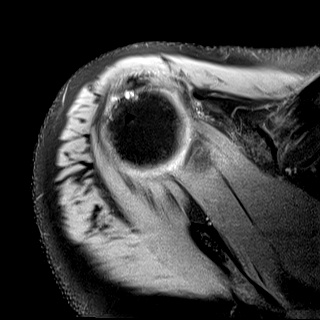
[im 17/20]
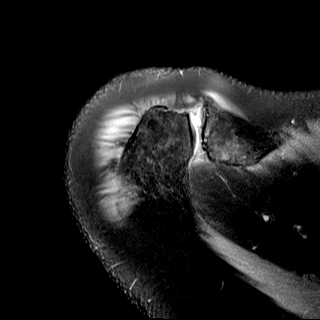
[im 20/20]
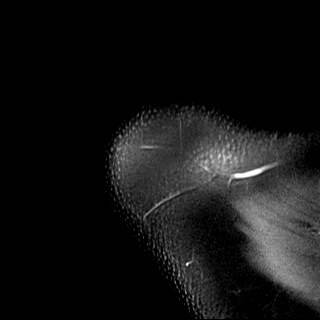

[Series 6: T2 fat-sat · oblique · right · 4.0mm · 0.22mm/px · 3 of 17 slices shown (1 of 2)]
[im 3/17]
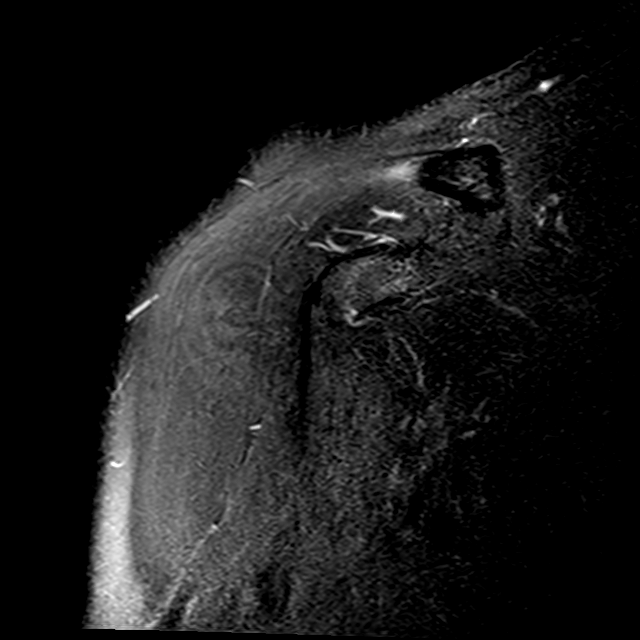
[im 9/17]
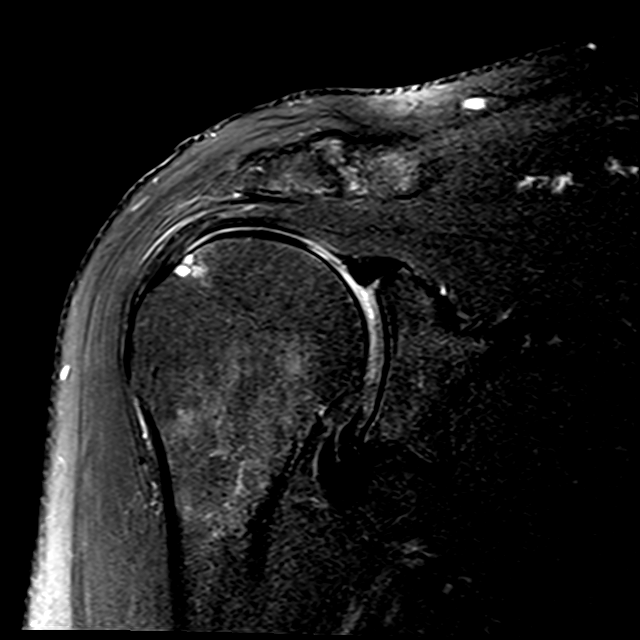
[im 14/17]
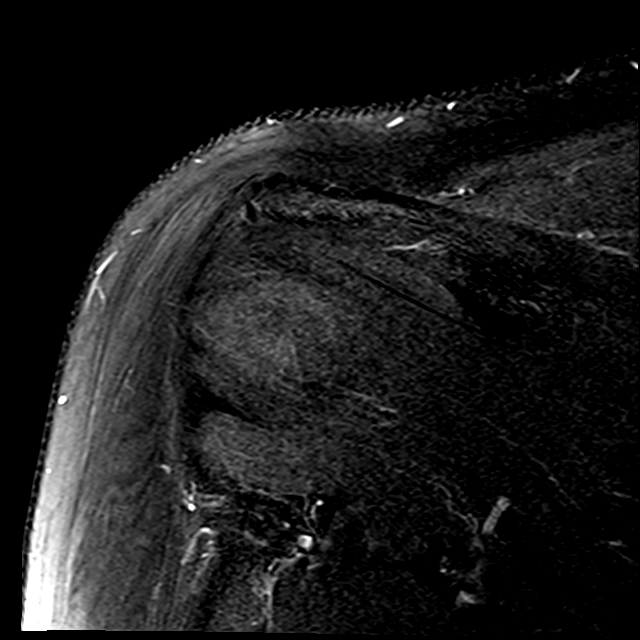

[Series 7: PD fat-sat · oblique · right · 4.0mm · 0.22mm/px · 7 of 17 slices shown (2 of 2)]
[im 1/17]
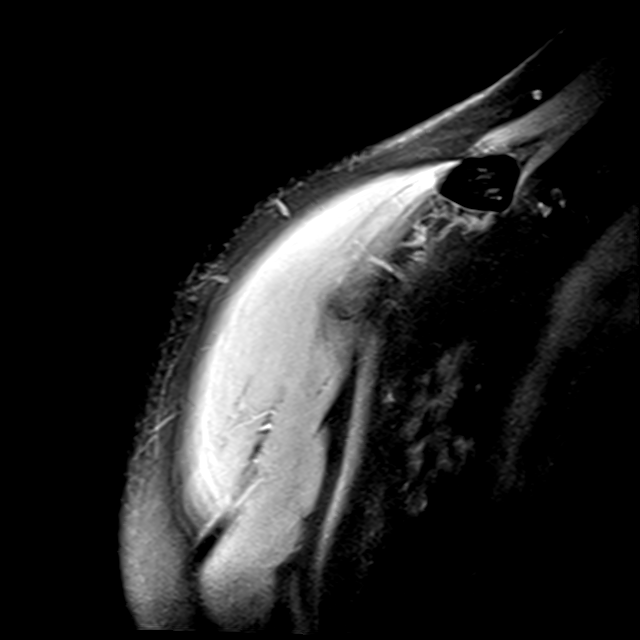
[im 3/17]
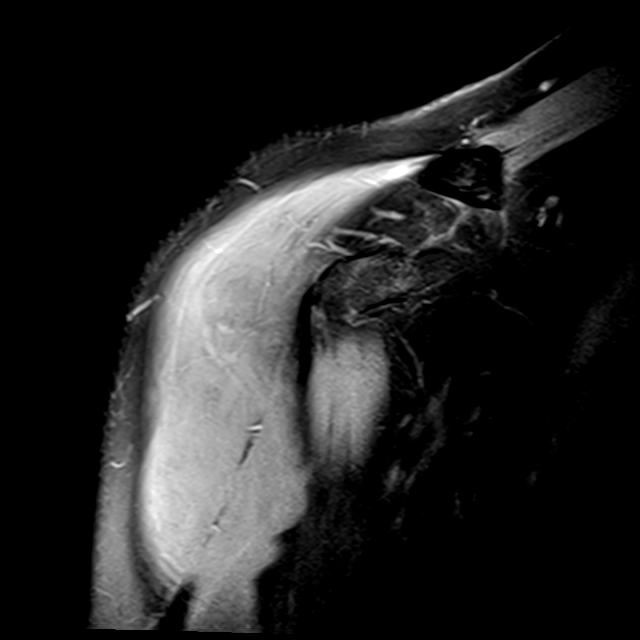
[im 6/17]
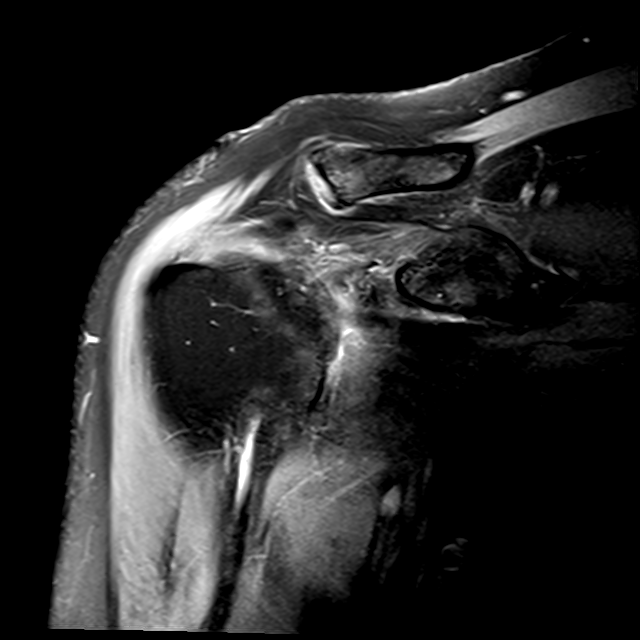
[im 9/17]
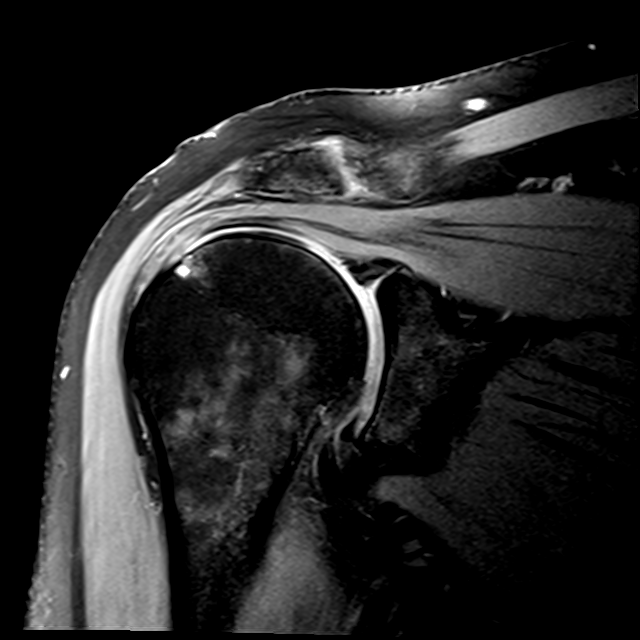
[im 11/17]
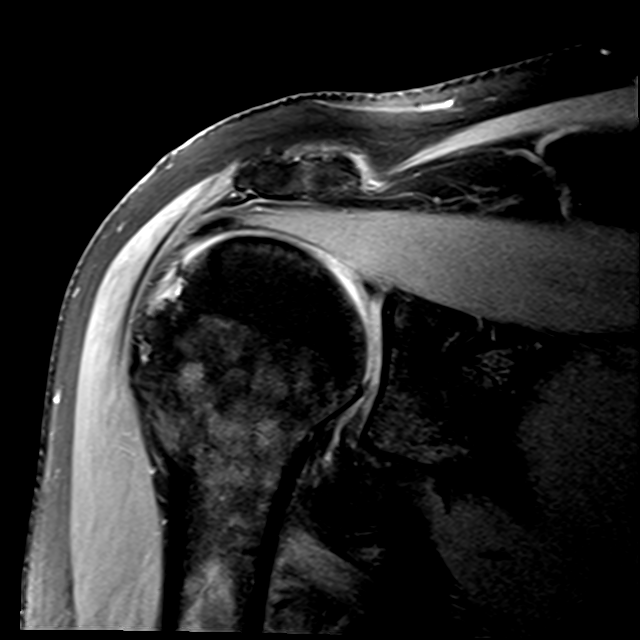
[im 14/17]
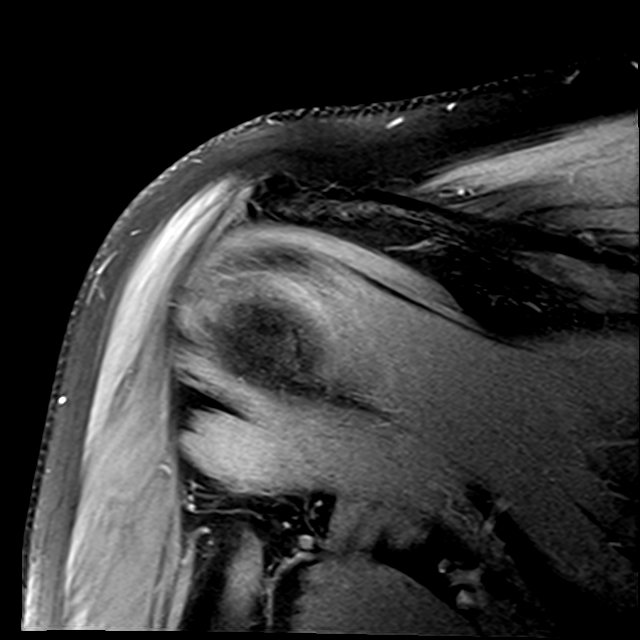
[im 17/17]
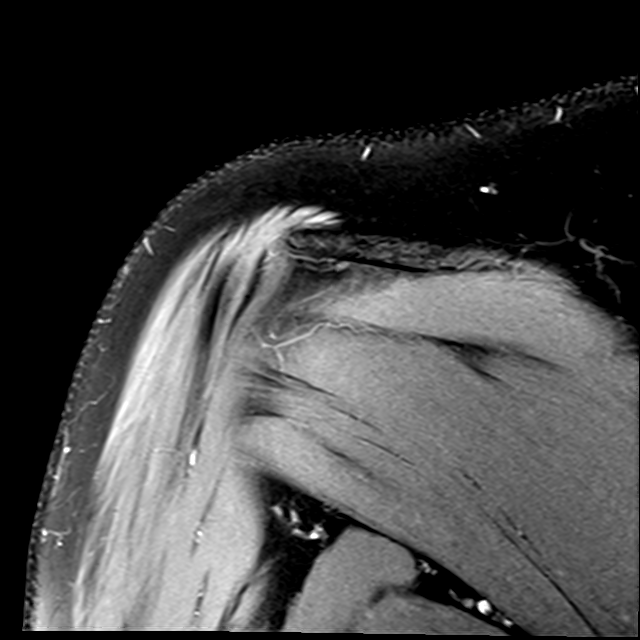

[Series 8: T2 fat-sat · oblique · right · 4.0mm · 0.44mm/px · 3 of 19 slices shown (2 of 2)]
[im 3/19]
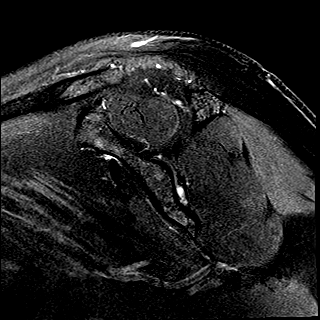
[im 11/19]
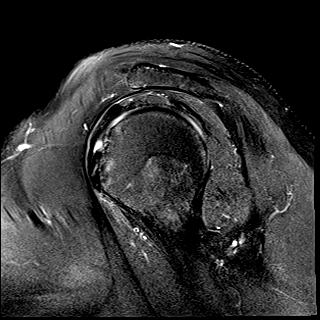
[im 16/19]
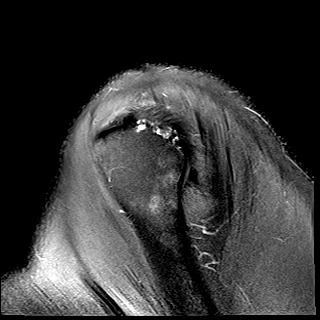

[21 of 40 positions shown; findings below may reference images not displayed]

FINDINGS: Rotator cuff: Intact supraspinatus, infraspinatus, teres minor and
subscapularis tendons.

Muscles: No atrophy or abnormal signal of the muscles of the rotator
cuff.

Biceps long head: There is an abnormal appearance of the long head
of the biceps tendon either representing a longitudinal split tear
of the intra-articular portion or a congenital bifid proximal long
head of the biceps tendon. The tendon is not dislocated.

Acromioclavicular Joint: Very minimal AC joint arthropathy. Type 2
acromion. No bursitis.

Glenohumeral Joint: Normal.

Labrum:  Normal.

Bones:  No significant abnormalities.

Other: None
IMPRESSION: 1. Intact rotator cuff.
2. Abnormal appearance of the long head of the biceps tendon either
representing a longitudinal split tear of the intra-articular
portion or a congenital bifid proximal long head of the biceps
tendon.

## 2020-09-14 ENCOUNTER — Telehealth: Payer: Self-pay | Admitting: Specialist

## 2020-09-14 ENCOUNTER — Encounter: Payer: Self-pay | Admitting: Radiology

## 2020-09-14 NOTE — Telephone Encounter (Signed)
Called patient left message on voicemail to return call to schedule an appointment with Dr Otelia Sergeant per [facility] request Toe/Ankle pain

## 2020-09-21 ENCOUNTER — Telehealth: Payer: Self-pay | Admitting: Radiology

## 2020-09-21 ENCOUNTER — Other Ambulatory Visit: Payer: Self-pay | Admitting: Specialist

## 2020-09-21 NOTE — Telephone Encounter (Signed)
Patient called and states that he is in severe pain and it is so bad that he has been vomiting since 4am, and that his pain has progressively gotten worse over the last several days.  He states that he is NOT leaving the house so he states that he will not come in to be evaluated.  He states that he is NOT asking for hydrocodone but he IS asking for Oxycodone. Please advise. And if you want to verify the pain he is in he said you can call his mother.

## 2020-09-21 NOTE — Telephone Encounter (Signed)
JN pt 

## 2020-09-21 NOTE — Telephone Encounter (Signed)
I called and spoke with patient and advised that he either needs to see his PCP ASAP or go to the ER ASAP, he had left a message on my VM and stated that he thinks he has developed a UTI, and that he was having back pain that was running down his leg into his foot.  He states that he has appt with his PCP on 09/23/20 @ 1pm.  I advised that he really should be seen today that I don't feel like this is something the hydrocodone or oxycodone will fix.  That he really should go to the ER as soon as he can.

## 2020-09-21 NOTE — Telephone Encounter (Signed)
Please see the note in patient calls

## 2020-10-12 ENCOUNTER — Ambulatory Visit: Payer: BC Managed Care – PPO | Admitting: Specialist

## 2021-02-10 ENCOUNTER — Encounter: Payer: Self-pay | Admitting: Specialist

## 2021-02-20 ENCOUNTER — Ambulatory Visit (INDEPENDENT_AMBULATORY_CARE_PROVIDER_SITE_OTHER): Payer: BC Managed Care – PPO | Admitting: Specialist

## 2021-02-20 ENCOUNTER — Ambulatory Visit: Payer: Self-pay

## 2021-02-20 ENCOUNTER — Encounter: Payer: Self-pay | Admitting: Specialist

## 2021-02-20 ENCOUNTER — Other Ambulatory Visit: Payer: Self-pay

## 2021-02-20 VITALS — BP 114/76 | HR 89 | Ht 71.0 in | Wt 193.0 lb

## 2021-02-20 DIAGNOSIS — M25571 Pain in right ankle and joints of right foot: Secondary | ICD-10-CM | POA: Diagnosis not present

## 2021-02-20 DIAGNOSIS — M19071 Primary osteoarthritis, right ankle and foot: Secondary | ICD-10-CM | POA: Diagnosis not present

## 2021-02-20 MED ORDER — DICLOFENAC SODIUM 1 % EX GEL: 2.0000 g | Freq: Four times a day (QID) | CUTANEOUS | 6 refills | Status: DC

## 2021-02-20 MED ORDER — HYDROCODONE-ACETAMINOPHEN 7.5-325 MG PO TABS: 1.0000 | ORAL_TABLET | Freq: Four times a day (QID) | ORAL | 0 refills | Status: DC | PRN
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
## 2021-02-20 NOTE — Patient Instructions (Signed)
Antiinflamatory agent, transdermal voltaren gel 2gm rub into the area of arthritis right foot. An arch support that is rigid and decreases motion across the right foot. Warm water massage.  Hydrocodone for acute pain. CBD the right foot is suffering from osteoarthritis, only real proven treatments are Weight loss, NSIADs like diclofenac gel and exercise. Well padded shoes help. Stiff sole with good arch support.  Well padded shoes help. Ice the knee 2-3 times a day 15-20 mins at a time.-3 times a day 15-20 mins at a time. Hot showers in the AM.  Injection with steroid may be of benefit. Hemp CBD capsules, amazon.com 5,000-7,000 mg per bottle, 60 capsules per bottle, take one capsule twice a day. Cane in the left hand to use with left leg weight bearing. Follow-Up Instructions: No follow-ups on file.  Schedule you an appointment with Dr. Lajoyce Corners to assess the arthrosis in the right foot and whether a fusion should be considered.

## 2021-03-03 ENCOUNTER — Encounter: Payer: Self-pay | Admitting: Specialist

## 2021-03-07 ENCOUNTER — Other Ambulatory Visit: Payer: Self-pay | Admitting: Specialist

## 2021-03-08 ENCOUNTER — Other Ambulatory Visit: Payer: Self-pay | Admitting: Specialist

## 2021-03-09 ENCOUNTER — Other Ambulatory Visit: Payer: Self-pay | Admitting: Specialist

## 2021-03-09 MED ORDER — HYDROCODONE-ACETAMINOPHEN 5-325 MG PO TABS: 1.0000 | ORAL_TABLET | Freq: Four times a day (QID) | ORAL | 0 refills | Status: AC | PRN

## 2021-03-20 ENCOUNTER — Ambulatory Visit: Payer: Self-pay

## 2021-03-20 ENCOUNTER — Ambulatory Visit (INDEPENDENT_AMBULATORY_CARE_PROVIDER_SITE_OTHER): Payer: BC Managed Care – PPO | Admitting: Orthopedic Surgery

## 2021-03-20 DIAGNOSIS — M6701 Short Achilles tendon (acquired), right ankle: Secondary | ICD-10-CM

## 2021-03-20 DIAGNOSIS — M79671 Pain in right foot: Secondary | ICD-10-CM

## 2021-03-21 ENCOUNTER — Encounter: Payer: Self-pay | Admitting: Orthopedic Surgery
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
## 2021-03-28 ENCOUNTER — Other Ambulatory Visit: Payer: Self-pay | Admitting: Specialist

## 2021-03-29 MED ORDER — HYDROCODONE-ACETAMINOPHEN 7.5-325 MG PO TABS: 1.0000 | ORAL_TABLET | Freq: Four times a day (QID) | ORAL | 0 refills | Status: DC | PRN

## 2021-05-31 ENCOUNTER — Other Ambulatory Visit: Payer: Self-pay | Admitting: Specialist

## 2021-05-31 NOTE — Telephone Encounter (Signed)
Can you please take care of this. Last in office 02/2021 for fot pain needs ov

## 2021-06-01 ENCOUNTER — Other Ambulatory Visit: Payer: Self-pay | Admitting: Specialist

## 2021-06-07 ENCOUNTER — Telehealth: Payer: Self-pay | Admitting: Specialist

## 2021-06-07 ENCOUNTER — Other Ambulatory Visit: Payer: Self-pay | Admitting: Specialist

## 2021-06-07 NOTE — Telephone Encounter (Signed)
Patient called. Says [facility] did not get the refill sent in for hydrocodone.

## 2021-06-12 ENCOUNTER — Telehealth: Payer: Self-pay

## 2021-06-12 NOTE — Telephone Encounter (Signed)
Patient called stating he is needing a refill on his Norco 7.5/325. please advise.  Uses [facility] number [phone]

## 2021-06-21 ENCOUNTER — Other Ambulatory Visit: Payer: Self-pay | Admitting: Specialist

## 2021-07-13 ENCOUNTER — Ambulatory Visit (INDEPENDENT_AMBULATORY_CARE_PROVIDER_SITE_OTHER): Payer: BC Managed Care – PPO

## 2021-07-13 ENCOUNTER — Encounter: Payer: Self-pay | Admitting: Specialist

## 2021-07-13 ENCOUNTER — Other Ambulatory Visit: Payer: Self-pay

## 2021-07-13 ENCOUNTER — Telehealth: Payer: Self-pay | Admitting: Specialist

## 2021-07-13 ENCOUNTER — Ambulatory Visit (INDEPENDENT_AMBULATORY_CARE_PROVIDER_SITE_OTHER): Payer: BC Managed Care – PPO | Admitting: Specialist

## 2021-07-13 VITALS — BP 138/84 | HR 85 | Ht 71.0 in | Wt 193.0 lb

## 2021-07-13 DIAGNOSIS — G8929 Other chronic pain: Secondary | ICD-10-CM

## 2021-07-13 DIAGNOSIS — M542 Cervicalgia: Secondary | ICD-10-CM | POA: Diagnosis not present

## 2021-07-13 DIAGNOSIS — M546 Pain in thoracic spine: Secondary | ICD-10-CM | POA: Diagnosis not present

## 2021-07-13 DIAGNOSIS — M5441 Lumbago with sciatica, right side: Secondary | ICD-10-CM

## 2021-07-13 NOTE — Patient Instructions (Addendum)
Avoid overhead lifting and overhead use of the arms. Do not lift greater than 5 lbs. Adjust head rest in vehicle to prevent hyperextension if rear ended. Take extra precautions to avoid falling, including use of a cane if you feel weak. Avoid frequent bending and stooping  No lifting greater than 10 lbs. May use ice or moist heat for pain. Weight loss is of benefit. Best medication for lumbar disc disease is arthritis medications like motrin, celebrex and naprosyn. Exercise is important to improve your indurance and does allow people to function better inspite of back pain.  You have a failed lumbar surgical spine post fusion with back pain and radicular pain. Cervical condition is likely disc protrusion centrally and DDD worse at C5-6. Recommend conservative treatement and starting a therapy program as there is no focal deficit. DIsc arthroplasty is a consideration if  You fail conservative managament. Pain management is appropriate means of treating your spine condition.
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
## 2021-07-13 NOTE — Telephone Encounter (Signed)
Patient would like hydrocodone 10-325 called in for him. He was here today.

## 2021-08-30 ENCOUNTER — Ambulatory Visit: Payer: BC Managed Care – PPO | Admitting: Specialist

## 2021-09-07 ENCOUNTER — Encounter: Payer: Self-pay | Admitting: Specialist

## 2021-09-07 ENCOUNTER — Ambulatory Visit (INDEPENDENT_AMBULATORY_CARE_PROVIDER_SITE_OTHER): Payer: BC Managed Care – PPO | Admitting: Specialist

## 2021-09-07 ENCOUNTER — Ambulatory Visit: Payer: Self-pay

## 2021-09-07 ENCOUNTER — Other Ambulatory Visit: Payer: Self-pay

## 2021-09-07 VITALS — BP 111/72 | HR 80 | Ht 70.0 in | Wt 203.0 lb

## 2021-09-07 DIAGNOSIS — S92535A Nondisplaced fracture of distal phalanx of left lesser toe(s), initial encounter for closed fracture: Secondary | ICD-10-CM

## 2021-09-07 DIAGNOSIS — M25571 Pain in right ankle and joints of right foot: Secondary | ICD-10-CM | POA: Diagnosis not present

## 2021-09-07 MED ORDER — HYDROCODONE-ACETAMINOPHEN 7.5-325 MG PO TABS: 1.0000 | ORAL_TABLET | Freq: Four times a day (QID) | ORAL | 0 refills | Status: DC | PRN

## 2021-09-07 NOTE — Patient Instructions (Signed)
Use a wooden healing shoe to keep from bending the toes and you may walk mainly on the heel of the foot. No ice or heat, elevate above the heart. Hydrocodone for discomfort. Do not pop or puncture the blister. When the blister breaks use triple antibiotic or mupirocin creame and a dry dressing. Call if you are concerned about infection.
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
## 2021-09-10 ENCOUNTER — Encounter: Payer: Self-pay | Admitting: Specialist

## 2021-09-11 ENCOUNTER — Other Ambulatory Visit: Payer: Self-pay | Admitting: Specialist

## 2021-09-14 ENCOUNTER — Other Ambulatory Visit: Payer: Self-pay | Admitting: Specialist

## 2021-09-15 MED ORDER — HYDROCODONE-ACETAMINOPHEN 7.5-325 MG PO TABS: 1.0000 | ORAL_TABLET | Freq: Four times a day (QID) | ORAL | 0 refills | Status: DC | PRN

## 2021-09-24 ENCOUNTER — Other Ambulatory Visit: Payer: Self-pay | Admitting: Specialist

## 2021-09-25 MED ORDER — HYDROCODONE-ACETAMINOPHEN 7.5-325 MG PO TABS: 1.0000 | ORAL_TABLET | Freq: Four times a day (QID) | ORAL | 0 refills | Status: DC | PRN

## 2021-10-05 ENCOUNTER — Other Ambulatory Visit: Payer: Self-pay

## 2021-10-05 ENCOUNTER — Encounter: Payer: Self-pay | Admitting: Specialist

## 2021-10-05 ENCOUNTER — Ambulatory Visit (INDEPENDENT_AMBULATORY_CARE_PROVIDER_SITE_OTHER): Payer: BC Managed Care – PPO | Admitting: Specialist

## 2021-10-05 ENCOUNTER — Ambulatory Visit: Payer: Self-pay

## 2021-10-05 VITALS — BP 115/71 | HR 81 | Ht 70.0 in | Wt 204.0 lb

## 2021-10-05 DIAGNOSIS — Z981 Arthrodesis status: Secondary | ICD-10-CM

## 2021-10-05 DIAGNOSIS — M79671 Pain in right foot: Secondary | ICD-10-CM | POA: Diagnosis not present

## 2021-10-05 DIAGNOSIS — M4726 Other spondylosis with radiculopathy, lumbar region: Secondary | ICD-10-CM

## 2021-10-05 DIAGNOSIS — S92535A Nondisplaced fracture of distal phalanx of left lesser toe(s), initial encounter for closed fracture: Secondary | ICD-10-CM

## 2021-10-05 MED ORDER — HYDROCODONE-ACETAMINOPHEN 7.5-325 MG PO TABS: 1.0000 | ORAL_TABLET | Freq: Four times a day (QID) | ORAL | 0 refills | Status: DC | PRN
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
## 2021-10-05 NOTE — Patient Instructions (Signed)
Fall Prevention and Home Safety Falls cause injuries and can affect all age groups. It is possible to use preventive measures to significantly decrease the likelihood of falls. There are many simple measures which can make your home safer and prevent falls. OUTDOORS Repair cracks and edges of walkways and driveways. Remove high doorway thresholds. Trim shrubbery on the main path into your home. Have good outside lighting. Clear walkways of tools, rocks, debris, and clutter. Check that handrails are not broken and are securely fastened. Both sides of steps should have handrails. Have leaves, snow, and ice cleared regularly. Use sand or salt on walkways during winter months. In the garage, clean up grease or oil spills. BATHROOM Install night lights. Install grab bars by the toilet and in the tub and shower. Use non-skid mats or decals in the tub or shower. Place a plastic non-slip stool in the shower to sit on, if needed. Keep floors dry and clean up all water on the floor immediately. Remove soap buildup in the tub or shower on a regular basis. Secure bath mats with non-slip, double-sided rug tape. Remove throw rugs and tripping hazards from the floors. BEDROOMS Install night lights. Make sure a bedside light is easy to reach. Do not use oversized bedding. Keep a telephone by your bedside. Have a firm chair with side arms to use for getting dressed. Remove throw rugs and tripping hazards from the floor. KITCHEN Keep handles on pots and pans turned toward the center of the stove. Use back burners when possible. Clean up spills quickly and allow time for drying. Avoid walking on wet floors. Avoid hot utensils and knives. Position shelves so they are not too high or low. Place commonly used objects within easy reach. If necessary, use a sturdy step stool with a grab bar when reaching. Keep electrical cables out of the way. Do not use floor polish or wax that makes floors slippery.  If you must use wax, use non-skid floor wax. Remove throw rugs and tripping hazards from the floor. STAIRWAYS Never leave objects on stairs. Place handrails on both sides of stairways and use them. Fix any loose handrails. Make sure handrails on both sides of the stairways are as long as the stairs. Check carpeting to make sure it is firmly attached along stairs. Make repairs to worn or loose carpet promptly. Avoid placing throw rugs at the top or bottom of stairways, or properly secure the rug with carpet tape to prevent slippage. Get rid of throw rugs, if possible. Have an electrician put in a light switch at the top and bottom of the stairs. OTHER FALL PREVENTION TIPS Wear low-heel or rubber-soled shoes that are supportive and fit well. Wear closed toe shoes. When using a stepladder, make sure it is fully opened and both spreaders are firmly locked. Do not climb a closed stepladder. Add color or contrast paint or tape to grab bars and handrails in your home. Place contrasting color strips on first and last steps. Learn and use mobility aids as needed. Install an electrical emergency response system. Turn on lights to avoid dark areas. Replace light bulbs that burn out immediately. Get light switches that glow. Arrange furniture to create clear pathways. Keep furniture in the same place. Firmly attach carpet with non-skid or double-sided tape. Eliminate uneven floor surfaces. Select a carpet pattern that does not visually hide the edge of steps. Be aware of all pets. OTHER HOME SAFETY TIPS Set the water temperature for 120 F (48.8 C). Keep  emergency numbers on or near the telephone. Keep smoke detectors on every level of the home and near sleeping areas. Document Released: 11/02/2002 Document Revised: 05/13/2012 Document Reviewed: 02/01/2012 [facility] Patient Information 2014 [place], [place].  Ambulate with the right foot flat. Do not need to buddy tape.

## 2021-10-18 MED ORDER — MONTELUKAST SODIUM 10 MG PO TABS: 20.0000 mg | ORAL_TABLET | Freq: Every day | ORAL | 6 refills | Status: AC

## 2021-10-25 ENCOUNTER — Other Ambulatory Visit: Payer: Self-pay | Admitting: Specialist

## 2021-11-02 ENCOUNTER — Ambulatory Visit: Payer: Self-pay | Admitting: Specialist

## 2021-11-03 ENCOUNTER — Ambulatory Visit (INDEPENDENT_AMBULATORY_CARE_PROVIDER_SITE_OTHER): Payer: BC Managed Care – PPO | Admitting: Specialist

## 2021-11-03 ENCOUNTER — Other Ambulatory Visit: Payer: Self-pay

## 2021-11-03 ENCOUNTER — Encounter: Payer: Self-pay | Admitting: Specialist

## 2021-11-03 VITALS — BP 110/72 | HR 76 | Ht 70.0 in | Wt 204.0 lb

## 2021-11-03 DIAGNOSIS — E119 Type 2 diabetes mellitus without complications: Secondary | ICD-10-CM

## 2021-11-03 DIAGNOSIS — M546 Pain in thoracic spine: Secondary | ICD-10-CM

## 2021-11-03 DIAGNOSIS — M79671 Pain in right foot: Secondary | ICD-10-CM | POA: Diagnosis not present

## 2021-11-03 DIAGNOSIS — M7741 Metatarsalgia, right foot: Secondary | ICD-10-CM

## 2021-11-03 DIAGNOSIS — Z794 Long term (current) use of insulin: Secondary | ICD-10-CM

## 2021-11-03 DIAGNOSIS — Z981 Arthrodesis status: Secondary | ICD-10-CM | POA: Diagnosis not present

## 2021-11-03 DIAGNOSIS — R251 Tremor, unspecified: Secondary | ICD-10-CM

## 2021-11-03 DIAGNOSIS — M7742 Metatarsalgia, left foot: Secondary | ICD-10-CM
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
## 2021-11-03 NOTE — Patient Instructions (Signed)
Avoid frequent bending and stooping  No lifting greater than 10 lbs. May use ice or moist heat for pain. Weight loss is of benefit. Best medication for lumbar disc disease is arthritis medications like motrin, celebrex and naprosyn. Exercise is important to improve your indurance and does allow people to function better inspite of back pain. Neurology evaluation for bilateral arm and hand tremor ? Asterixis Bilateral shoe inserts for metatarsalgia and diabetes.

## 2021-11-16 ENCOUNTER — Other Ambulatory Visit: Payer: Self-pay | Admitting: Specialist

## 2021-11-16 ENCOUNTER — Telehealth: Payer: Self-pay | Admitting: Specialist

## 2021-11-16 NOTE — Telephone Encounter (Signed)
Pt states he is in a lot of pain. He states he went to see pain management today and they told him Michael [name] can prescribe him something for pain. Wondering if he can something.   CB [phone]

## 2021-11-16 NOTE — Telephone Encounter (Signed)
Per JN the Michael [name] is going to pain management that's where he needs to get his meds prescribed from.  I called and spoke with Michael [name] and advised him of what Dr. Otelia Sergeant said, he asked me to beg Dr. Otelia Sergeant to give him something for pain, and I told him I am not begging the Dr for his pain meds. I advised that he just needs to contact his pain management to get it. Michael [name] was upset that we would not give him anything.

## 2021-11-30 ENCOUNTER — Encounter: Payer: Self-pay | Admitting: Specialist

## 2021-12-06 ENCOUNTER — Other Ambulatory Visit: Payer: Self-pay | Admitting: Specialist

## 2021-12-06 MED ORDER — CELECOXIB 200 MG PO CAPS: 200.0000 mg | ORAL_CAPSULE | Freq: Two times a day (BID) | ORAL | 2 refills | Status: DC

## 2021-12-29 ENCOUNTER — Inpatient Hospital Stay (HOSPITAL_COMMUNITY)
Admission: EM | Admit: 2021-12-29 | Discharge: 2021-12-31 | DRG: 442 | Disposition: A | Discharge status: Home / Self Care | Payer: BC Managed Care – PPO | Attending: Family Medicine | Admitting: Family Medicine

## 2021-12-29 ENCOUNTER — Emergency Department (HOSPITAL_COMMUNITY): Payer: BC Managed Care – PPO

## 2021-12-29 ENCOUNTER — Other Ambulatory Visit: Payer: Self-pay

## 2021-12-29 ENCOUNTER — Encounter (HOSPITAL_COMMUNITY): Payer: Self-pay | Admitting: *Deleted

## 2021-12-29 DIAGNOSIS — D696 Thrombocytopenia, unspecified: Secondary | ICD-10-CM | POA: Diagnosis present

## 2021-12-29 DIAGNOSIS — Z833 Family history of diabetes mellitus: Secondary | ICD-10-CM | POA: Diagnosis not present

## 2021-12-29 DIAGNOSIS — F419 Anxiety disorder, unspecified: Secondary | ICD-10-CM | POA: Diagnosis present

## 2021-12-29 DIAGNOSIS — Z9109 Other allergy status, other than to drugs and biological substances: Secondary | ICD-10-CM | POA: Diagnosis not present

## 2021-12-29 DIAGNOSIS — F17210 Nicotine dependence, cigarettes, uncomplicated: Secondary | ICD-10-CM | POA: Diagnosis present

## 2021-12-29 DIAGNOSIS — Z7951 Long term (current) use of inhaled steroids: Secondary | ICD-10-CM

## 2021-12-29 DIAGNOSIS — J9811 Atelectasis: Secondary | ICD-10-CM | POA: Diagnosis present

## 2021-12-29 DIAGNOSIS — K7682 Hepatic encephalopathy: Principal | ICD-10-CM

## 2021-12-29 DIAGNOSIS — Z8614 Personal history of Methicillin resistant Staphylococcus aureus infection: Secondary | ICD-10-CM

## 2021-12-29 DIAGNOSIS — K7030 Alcoholic cirrhosis of liver without ascites: Secondary | ICD-10-CM | POA: Diagnosis present

## 2021-12-29 DIAGNOSIS — J449 Chronic obstructive pulmonary disease, unspecified: Secondary | ICD-10-CM | POA: Diagnosis present

## 2021-12-29 DIAGNOSIS — E875 Hyperkalemia: Secondary | ICD-10-CM | POA: Diagnosis present

## 2021-12-29 DIAGNOSIS — Z811 Family history of alcohol abuse and dependence: Secondary | ICD-10-CM

## 2021-12-29 DIAGNOSIS — K219 Gastro-esophageal reflux disease without esophagitis: Secondary | ICD-10-CM | POA: Diagnosis present

## 2021-12-29 DIAGNOSIS — R7989 Other specified abnormal findings of blood chemistry: Secondary | ICD-10-CM

## 2021-12-29 DIAGNOSIS — Z83438 Family history of other disorder of lipoprotein metabolism and other lipidemia: Secondary | ICD-10-CM | POA: Diagnosis not present

## 2021-12-29 DIAGNOSIS — Z20822 Contact with and (suspected) exposure to covid-19: Secondary | ICD-10-CM | POA: Diagnosis present

## 2021-12-29 DIAGNOSIS — Z888 Allergy status to other drugs, medicaments and biological substances status: Secondary | ICD-10-CM | POA: Diagnosis not present

## 2021-12-29 DIAGNOSIS — Z91040 Latex allergy status: Secondary | ICD-10-CM | POA: Diagnosis not present

## 2021-12-29 DIAGNOSIS — Z79899 Other long term (current) drug therapy: Secondary | ICD-10-CM

## 2021-12-29 DIAGNOSIS — Z8249 Family history of ischemic heart disease and other diseases of the circulatory system: Secondary | ICD-10-CM

## 2021-12-29 DIAGNOSIS — E119 Type 2 diabetes mellitus without complications: Secondary | ICD-10-CM | POA: Diagnosis not present

## 2021-12-29 DIAGNOSIS — L732 Hidradenitis suppurativa: Secondary | ICD-10-CM | POA: Diagnosis present

## 2021-12-29 DIAGNOSIS — F1021 Alcohol dependence, in remission: Secondary | ICD-10-CM | POA: Diagnosis present

## 2021-12-29 DIAGNOSIS — I10 Essential (primary) hypertension: Secondary | ICD-10-CM | POA: Diagnosis not present

## 2021-12-29 DIAGNOSIS — Z8261 Family history of arthritis: Secondary | ICD-10-CM

## 2021-12-29 DIAGNOSIS — Z880 Allergy status to penicillin: Secondary | ICD-10-CM | POA: Diagnosis not present

## 2021-12-29 DIAGNOSIS — R4182 Altered mental status, unspecified: Principal | ICD-10-CM

## 2021-12-29 DIAGNOSIS — E872 Acidosis, unspecified: Secondary | ICD-10-CM | POA: Diagnosis present

## 2021-12-29 DIAGNOSIS — R197 Diarrhea, unspecified: Secondary | ICD-10-CM | POA: Diagnosis present

## 2021-12-29 DIAGNOSIS — Z7984 Long term (current) use of oral hypoglycemic drugs: Secondary | ICD-10-CM

## 2021-12-29 LAB — LACTIC ACID, PLASMA
Lactic Acid, Venous: 3.2 mmol/L (ref 0.5–1.9)
Lactic Acid, Venous: 2.4 mmol/L (ref 0.5–1.9)

## 2021-12-29 LAB — PROTIME-INR
INR: 1.2 (ref 0.8–1.2)
Prothrombin Time: 15.2 seconds (ref 11.4–15.2)

## 2021-12-29 LAB — COMPREHENSIVE METABOLIC PANEL
ALT: 27 U/L (ref 0–44)
AST: 34 U/L (ref 15–41)
Albumin: 3.9 g/dL (ref 3.5–5.0)
Alkaline Phosphatase: 70 U/L (ref 38–126)
Anion gap: 9 (ref 5–15)
BUN: 26 mg/dL — ABNORMAL HIGH (ref 6–20)
CO2: 16 mmol/L — ABNORMAL LOW (ref 22–32)
Calcium: 9.5 mg/dL (ref 8.9–10.3)
Chloride: 114 mmol/L — ABNORMAL HIGH (ref 98–111)
Creatinine, Ser: 1.2 mg/dL (ref 0.61–1.24)
GFR, Estimated: >60 mL/min (ref >60)
Glucose, Bld: 220 mg/dL — ABNORMAL HIGH (ref 70–99)
Potassium: 5.3 mmol/L — ABNORMAL HIGH (ref 3.5–5.1)
Sodium: 139 mmol/L (ref 135–145)
Total Bilirubin: 1.2 mg/dL (ref 0.3–1.2)
Total Protein: 7.4 g/dL (ref 6.5–8.1)

## 2021-12-29 LAB — RESP PANEL BY RT-PCR (FLU A&B, COVID) ARPGX2
Influenza A by PCR: NEGATIVE
Influenza B by PCR: NEGATIVE
SARS Coronavirus 2 by RT PCR: NEGATIVE

## 2021-12-29 LAB — AMMONIA: Ammonia: 163 umol/L — ABNORMAL HIGH (ref 9–35)

## 2021-12-29 LAB — ETHANOL: Alcohol, Ethyl (B): <10 mg/dL (ref <10)

## 2021-12-29 LAB — CBC
HCT: 43.4 % (ref 39.0–52.0)
Hemoglobin: 15 g/dL (ref 13.0–17.0)
MCH: 33.1 pg (ref 26.0–34.0)
MCHC: 34.6 g/dL (ref 30.0–36.0)
MCV: 95.8 fL (ref 80.0–100.0)
Platelets: 88 10*3/uL — ABNORMAL LOW (ref 150–400)
RBC: 4.53 MIL/uL (ref 4.22–5.81)
RDW: 14.6 % (ref 11.5–15.5)
WBC: 5.9 10*3/uL (ref 4.0–10.5)
nRBC: 0 % (ref 0.0–0.2)

## 2021-12-29 LAB — ACETAMINOPHEN LEVEL: Acetaminophen (Tylenol), Serum: <10 ug/mL — ABNORMAL LOW (ref 10–30)

## 2021-12-29 LAB — OSMOLALITY: Osmolality: 307 mOsm/kg — ABNORMAL HIGH (ref 275–295)

## 2021-12-29 LAB — CBG MONITORING, ED: Glucose-Capillary: 235 mg/dL — ABNORMAL HIGH (ref 70–99)

## 2021-12-29 LAB — APTT: aPTT: 35 seconds (ref 24–36)

## 2021-12-29 MED ORDER — LACTULOSE ENEMA
300.0000 mL | Freq: Four times a day (QID) | RECTAL | Status: DC
Start: 2021-12-30 — End: 2021-12-29

## 2021-12-29 MED ORDER — SODIUM BICARBONATE 8.4 % IV SOLN
25.0000 meq | Freq: Once | INTRAVENOUS | Status: AC
  Administered 2021-12-30: 25 meq via INTRAVENOUS
  Filled 2021-12-29: qty 50

## 2021-12-29 MED ORDER — NICOTINE 14 MG/24HR TD PT24
14.0000 mg | MEDICATED_PATCH | Freq: Every day | TRANSDERMAL | Status: DC
  Administered 2021-12-29 – 2021-12-31 (×3): 14 mg via TRANSDERMAL
  Filled 2021-12-29 (×3): qty 1

## 2021-12-29 MED ORDER — LACTULOSE ENEMA
300.0000 mL | Freq: Four times a day (QID) | RECTAL | Status: DC
Start: 2021-12-30 — End: 2021-12-30

## 2021-12-29 MED ORDER — INSULIN ASPART 100 UNIT/ML IJ SOLN
0.0000 [IU] | Freq: Three times a day (TID) | INTRAMUSCULAR | Status: DC
  Administered 2021-12-30: 1 [IU] via SUBCUTANEOUS
  Administered 2021-12-30: 2 [IU] via SUBCUTANEOUS
  Administered 2021-12-31: 1 [IU] via SUBCUTANEOUS

## 2021-12-29 MED ORDER — INSULIN ASPART 100 UNIT/ML IJ SOLN: 0.0000 [IU] | Freq: Every day | INTRAMUSCULAR | Status: DC

## 2021-12-29 MED ORDER — LACTULOSE 10 GM/15ML PO SOLN: 30.0000 g | Freq: Three times a day (TID) | ORAL | Status: DC

## 2021-12-29 MED ORDER — LACTULOSE ENEMA
300.0000 mL | Freq: Once | ORAL | Status: AC
  Administered 2021-12-29: 300 mL via RECTAL
  Filled 2021-12-29: qty 300

## 2021-12-29 MED ORDER — SODIUM CHLORIDE 0.9 % IV BOLUS
1000.0000 mL | Freq: Once | INTRAVENOUS | Status: AC
  Administered 2021-12-29: 1000 mL via INTRAVENOUS

## 2021-12-29 NOTE — Assessment & Plan Note (Addendum)
-   Continue home medications 

## 2021-12-29 NOTE — ED Provider Triage Note (Signed)
Emergency Medicine Provider Triage Evaluation Note  Level 5 caveat: Mental status change  Michael [name] , a 56 y.o. male  was evaluated in triage.  HPI provided by individual in room.  Relatively room notes that patient was normal last night, he left the patient in the house for several hours and came back and the patient appeared altered.  Denies the patient consuming alcohol or drug use.  Patient has a history of liver cirrhosis and diabetes.     Review of Systems  Level 5 caveat: Mental status change  Physical Exam  BP 133/79 (BP Location: Right Arm)    Pulse 95    Temp 97.7 F (36.5 C) (Oral)    Resp 18    SpO2 98%  Gen:   Somnolent during exam, arousable to loud noises Resp:  Normal effort  MSK:   Moves extremities without difficulty   Medical Decision Making  Medically screening exam initiated at 5:27 PM.  Appropriate orders placed.  Venia Minks Schlegel was informed that the remainder of the evaluation will be completed by another provider, this initial triage assessment does not replace that evaluation, and the importance of remaining in the ED until their evaluation is complete.  5:27 PM - Discussed with RN that patient is in need of a room immediately. RN aware and working on room placement.    Beaulah Romanek A, PA-C 12/29/21 1730

## 2021-12-29 NOTE — Assessment & Plan Note (Addendum)
Encephalopathy likely due to lactulose nonadherence.  Ammonia level significantly elevated. CT head negative for acute finding. Mentation normalized as of 2/5, though having profuse diarrhea. Will deescalate dose at home in hopes of improved adherence to avoid rehospitalization and improved quality of life with decreased diarrhea.

## 2021-12-29 NOTE — Assessment & Plan Note (Addendum)
This is a chronic problem and likely related to liver disease. Stable without evidence of bleeding

## 2021-12-29 NOTE — ED Notes (Signed)
Lab called to recollect a couple labs (ammonia and osmolarity)

## 2021-12-29 NOTE — Assessment & Plan Note (Addendum)
Followed by dermatology at [facility] and on infliximab  -Resume cefdinir chronica med

## 2021-12-29 NOTE — Assessment & Plan Note (Addendum)
HbA1c is 5.2%. No change to home medications made at discharge, though may indicate overly tight control and should be followed by PCP.

## 2021-12-29 NOTE — ED Triage Notes (Signed)
The pt was found by his partner confused talking strange this afternoon  the pt has a liver problem  his sugar was checked here  not the problem  he also takes laculose.   Sitting in w/c not talking at prfesent

## 2021-12-29 NOTE — ED Provider Notes (Signed)
[facility] EMERGENCY DEPARTMENT Provider Note   CSN: [id number] Arrival date & time: 12/29/21  1716     History  Chief Complaint  Patient presents with   Altered Mental Status    Michael [name] is a 56 y.o. male history of cirrhosis on chronic lactulose, chronic weakness currently PT, history of diabetes here for evaluation of altered mental status.  Partner provides history as patient altered, cannot provide  Patient with normal mental status yesterday.  Upon waking up was still sleeping, partner left for work, patient's mother called and he did not answer partner came home found patient in bathroom floor, altered.  He thought this was due to low blood sugar, try to get him to drink a [facility].  Patient has been his normal self up until this incident.  His birthday was on [birth year], he did miss a dose of lactulose however prior to and after this has been taking his normal dose of lactulose with normal bowel movements without any melena or blood per rectum.  No recent EtOH use or drug use.  Not any upper respiratory symptoms, fevers, back pain, emesis.  No recent medication changes.  No seizure-like activity.  HPI     Home Medications Prior to Admission medications   Medication Sig Start Date End Date Taking? Authorizing Provider  budesonide-formoterol (SYMBICORT) 160-4.5 MCG/ACT inhaler Inhale 2 puffs into the lungs 2 (two) times daily.    [provider]  cefdinir (OMNICEF) 300 MG capsule Take 300 mg by mouth 2 (two) times daily.    [provider]  celecoxib (CELEBREX) 200 MG capsule Take 1 capsule (200 mg total) by mouth 2 (two) times daily after a meal. 12/06/21   Kerrin Champagne, MD  cetirizine (ZYRTEC) 10 MG tablet Take 10 mg by mouth daily.    [provider]  Continuous Blood Gluc Sensor (FREESTYLE LIBRE 14 DAY SENSOR) MISC 1 each by Does not apply route as directed. 03/10/19   Mirian Mo, MD  dapagliflozin  propanediol (FARXIGA) 5 MG TABS tablet Take 5 mg by mouth daily.    [provider]  diclofenac Sodium (VOLTAREN) 1 % GEL APPLY 2 GRAMS TO AFFECTED AREA 4 TIMES A DAY 09/11/21   Kerrin Champagne, MD  ferrous sulfate 325 (65 FE) MG tablet Take 325 mg by mouth daily before supper.    [provider]  hydrochlorothiazide (HYDRODIURIL) 25 MG tablet Take 25 mg by mouth daily.    [provider]  inFLIXimab (REMICADE IV) Inject into the vein every 6 (six) weeks.    [provider]  lidocaine (LIDODERM) 5 %  09/22/19   [provider]  lisinopril (PRINIVIL,ZESTRIL) 40 MG tablet Take 40 mg by mouth daily.    [provider]  metFORMIN (GLUCOPHAGE) 500 MG tablet Take 1 tablet (500 mg total) by mouth 2 (two) times daily with a meal. 03/11/19   Leland Her, DO  methocarbamol (ROBAXIN) 500 MG tablet TAKE 1 TABLET BY MOUTH EVERY 6 HOURS AS NEEDED FOR MUSCLE SPASM 02/23/19   Kerrin Champagne, MD  montelukast (SINGULAIR) 10 MG tablet Take 2 tablets (20 mg total) by mouth at bedtime. 10/18/21   Kerrin Champagne, MD  omeprazole (PRILOSEC) 20 MG capsule Take 1 capsule (20 mg total) by mouth 2 (two) times daily. 01/24/17   Marinda Elk, MD  ondansetron (ZOFRAN) 8 MG tablet Take 8 mg by mouth every 8 (eight) hours as needed for nausea or  vomiting.    [provider]  propranolol (INDERAL) 10 MG tablet Take 10 mg by mouth 2 (two) times daily. 02/07/17   [provider]  pyridOXINE (VITAMIN B-6) 100 MG tablet TAKE 1 TABLET BY MOUTH EVERY DAY 06/22/19   Kerrin ChampagneNitka, James E, MD  thiamine 100 MG tablet TAKE 1 TABLET BY MOUTH EVERY DAY 06/22/19   Kerrin ChampagneNitka, James E, MD  traZODone (DESYREL) 100 MG tablet Take 100 mg by mouth at bedtime.  01/07/19   [provider]  zolpidem (AMBIEN) 10 MG tablet Take 10 mg by mouth at bedtime.  09/23/17   [provider]      Allergies    Cat hair extract, Iodine-131, Other, Penicillins, Nickel, Chlorhexidine, and  Latex    Review of Systems   Review of Systems  Unable to perform ROS: Mental status change   Physical Exam Updated Vital Signs BP (!) 132/106    Pulse 100    Temp 97.7 F (36.5 C) (Oral)    Resp 15    Ht 5\' 10"  (1.778 m)    Wt 92.5 kg    SpO2 99%    BMI 29.26 kg/m  Physical Exam Vitals and nursing note reviewed.  Constitutional:      General: He is not in acute distress.    Appearance: He is well-developed. He is ill-appearing. He is not diaphoretic.  HENT:     Head: Normocephalic and atraumatic.     Nose: Nose normal.     Mouth/Throat:     Mouth: Mucous membranes are moist.     Comments: Clear, dry membranes Eyes:     Pupils: Pupils are equal, round, and reactive to light.  Cardiovascular:     Rate and Rhythm: Normal rate and regular rhythm.     Pulses: Normal pulses.     Heart sounds: Normal heart sounds.  Pulmonary:     Effort: Pulmonary effort is normal. No respiratory distress.     Breath sounds: Normal breath sounds.     Comments: Clear bil Abdominal:     General: Bowel sounds are normal. There is no distension.     Palpations: Abdomen is soft.     Comments: Soft, large midline incision without surrounding erythema or warmth  Musculoskeletal:        General: Normal range of motion.     Cervical back: Normal range of motion and neck supple.     Comments: Spontaneously moves extremities.  No lower extremity edema  Skin:    General: Skin is warm and dry.     Comments: Scant petechial spots  Neurological:     Mental Status: He is lethargic, disoriented and confused.     GCS: GCS eye subscore is 3. GCS verbal subscore is 3. GCS motor subscore is 5.     Comments: Opens eyes to commands, intermittently follows commands however not consistent.  No obvious facial droop. Withdraws to pain.Will intermittently say single words however no fluent speech  Asterixis Bl    ED Results / Procedures / Treatments   Labs (all labs ordered are listed, but only abnormal results  are displayed) Labs Reviewed  COMPREHENSIVE METABOLIC PANEL - Abnormal; Notable for the following components:      Result Value   Potassium 5.3 (*)    Chloride 114 (*)    CO2 16 (*)    Glucose, Bld 220 (*)    BUN 26 (*)    All other components within normal limits  CBC - Abnormal;  Notable for the following components:   Platelets 88 (*)    All other components within normal limits  ACETAMINOPHEN LEVEL - Abnormal; Notable for the following components:   Acetaminophen (Tylenol), Serum <10 (*)    All other components within normal limits  LACTIC ACID, PLASMA - Abnormal; Notable for the following components:   Lactic Acid, Venous 3.2 (*)    All other components within normal limits  LACTIC ACID, PLASMA - Abnormal; Notable for the following components:   Lactic Acid, Venous 2.4 (*)    All other components within normal limits  AMMONIA - Abnormal; Notable for the following components:   Ammonia 163 (*)    All other components within normal limits  OSMOLALITY - Abnormal; Notable for the following components:   Osmolality 307 (*)    All other components within normal limits  CBG MONITORING, ED - Abnormal; Notable for the following components:   Glucose-Capillary 235 (*)    All other components within normal limits  RESP PANEL BY RT-PCR (FLU A&B, COVID) ARPGX2  ETHANOL  APTT  PROTIME-INR  RAPID URINE DRUG SCREEN, HOSP PERFORMED  URINALYSIS, ROUTINE W REFLEX MICROSCOPIC  CBG MONITORING, ED    EKG EKG Interpretation  Date/Time:  Friday December 29 2021 17:24:38 EST Ventricular Rate:  91 PR Interval:  158 QRS Duration: 134 QT Interval:  408 QTC Calculation: 501 R Axis:   -38 Text Interpretation: Sinus rhythm with frequent Premature ventricular complexes Left axis deviation Right bundle branch block Minimal voltage criteria for LVH, may be normal variant ( R in aVL ) Lateral infarct , age undetermined Inferior infarct , age undetermined Abnormal ECG When compared with ECG of  10-Aug-2020 00:15, PREVIOUS ECG IS PRESENT since last tracing no significant change other than now seeing ectopy Confirmed by Eber HongMiller, Brian ([id number]) on 12/29/2021 5:42:20 PM  Radiology CT Head Wo Contrast  Result Date: 12/29/2021 CLINICAL DATA:  Neck trauma, altered mental status, midline tenderness EXAM: CT HEAD WITHOUT CONTRAST CT CERVICAL SPINE WITHOUT CONTRAST TECHNIQUE: Multidetector CT imaging of the head and cervical spine was performed following the standard protocol without intravenous contrast. Multiplanar CT image reconstructions of the cervical spine were also generated. RADIATION DOSE REDUCTION: This exam was performed according to the departmental dose-optimization program which includes automated exposure control, adjustment of the mA and/or kV according to patient size and/or use of iterative reconstruction technique. COMPARISON:  12/18/2020 FINDINGS: Brain: No evidence of acute infarction, hemorrhage, extra-axial collection, ventriculomegaly, or mass effect. Generalized cerebral atrophy. Periventricular white matter low attenuation likely secondary to microangiopathy. Vascular: No significant cerebrovascular atherosclerotic calcifications. No hyperdense vessels. Skull: Negative for fracture or focal lesion. Sinuses/Orbits: Visualized portions of the orbits are unremarkable. Visualized portions of the paranasal sinuses are unremarkable. Visualized portions of the mastoid air cells are unremarkable. Other: None. CT CERVICAL SPINE FINDINGS Alignment: Normal. Skull base and vertebrae: No acute fracture. No primary bone lesion or focal pathologic process. Soft tissues and spinal canal: No prevertebral fluid or swelling. No visible canal hematoma. Disc levels: Disc spaces are maintained. No foraminal or central canal stenosis. At C3-4 there is moderate left facet arthropathy and mild right facet arthropathy. At C4-5 there is moderate bilateral facet arthropathy. At C5-6 there is moderate bilateral facet  arthropathy. At C6-7 there is moderate bilateral facet arthropathy. Upper chest: Lung apices are clear. Other: No fluid collection or hematoma. IMPRESSION: 1. No acute intracranial pathology. 2. No acute osseous injury of the cervical spine. 3. Facet arthropathy throughout the cervical spine. Electronically  Signed   By: Elige Ko M.D.   On: 12/29/2021 19:19   CT Cervical Spine Wo Contrast  Result Date: 12/29/2021 CLINICAL DATA:  Neck trauma, altered mental status, midline tenderness EXAM: CT HEAD WITHOUT CONTRAST CT CERVICAL SPINE WITHOUT CONTRAST TECHNIQUE: Multidetector CT imaging of the head and cervical spine was performed following the standard protocol without intravenous contrast. Multiplanar CT image reconstructions of the cervical spine were also generated. RADIATION DOSE REDUCTION: This exam was performed according to the departmental dose-optimization program which includes automated exposure control, adjustment of the mA and/or kV according to patient size and/or use of iterative reconstruction technique. COMPARISON:  12/18/2020 FINDINGS: Brain: No evidence of acute infarction, hemorrhage, extra-axial collection, ventriculomegaly, or mass effect. Generalized cerebral atrophy. Periventricular white matter low attenuation likely secondary to microangiopathy. Vascular: No significant cerebrovascular atherosclerotic calcifications. No hyperdense vessels. Skull: Negative for fracture or focal lesion. Sinuses/Orbits: Visualized portions of the orbits are unremarkable. Visualized portions of the paranasal sinuses are unremarkable. Visualized portions of the mastoid air cells are unremarkable. Other: None. CT CERVICAL SPINE FINDINGS Alignment: Normal. Skull base and vertebrae: No acute fracture. No primary bone lesion or focal pathologic process. Soft tissues and spinal canal: No prevertebral fluid or swelling. No visible canal hematoma. Disc levels: Disc spaces are maintained. No foraminal or central  canal stenosis. At C3-4 there is moderate left facet arthropathy and mild right facet arthropathy. At C4-5 there is moderate bilateral facet arthropathy. At C5-6 there is moderate bilateral facet arthropathy. At C6-7 there is moderate bilateral facet arthropathy. Upper chest: Lung apices are clear. Other: No fluid collection or hematoma. IMPRESSION: 1. No acute intracranial pathology. 2. No acute osseous injury of the cervical spine. 3. Facet arthropathy throughout the cervical spine. Electronically Signed   By: Elige Ko M.D.   On: 12/29/2021 19:19   DG CHEST PORT 1 VIEW  Result Date: 12/29/2021 CLINICAL DATA:  Altered mental status. EXAM: PORTABLE CHEST 1 VIEW COMPARISON:  12/18/2020. FINDINGS: Mild linear opacities at the lung bases. No confluent consolidation. No visible pleural effusions or pneumothorax. Cardiomediastinal silhouette is within normal limits. IMPRESSION: Mild subsegmental bibasilar atelectasis, similar. Electronically Signed   By: Feliberto Harts M.D.   On: 12/29/2021 18:02    Procedures .Critical Care Performed by: Linwood Dibbles, PA-C Authorized by: Linwood Dibbles, PA-C   Critical care provider statement:    Critical care time (minutes):  35   Critical care was necessary to treat or prevent imminent or life-threatening deterioration of the following conditions:  Hepatic failure   Critical care was time spent personally by me on the following activities:  Development of treatment plan with patient or surrogate, discussions with consultants, evaluation of patient's response to treatment, examination of patient, ordering and review of laboratory studies, ordering and review of radiographic studies, ordering and performing treatments and interventions, pulse oximetry, re-evaluation of patient's condition and review of old charts    Medications Ordered in ED Medications  lactulose (CHRONULAC) enema 200 gm (has no administration in time range)  nicotine (NICODERM CQ -  dosed in mg/24 hours) patch 14 mg (has no administration in time range)  sodium chloride 0.9 % bolus 1,000 mL (1,000 mLs Intravenous New Bag/Given 12/29/21 1824)    ED Course/ Medical Decision Making/ A&P    56 year old here for evaluation of altered mental status, last known normal yesterday evening prior to bed.  Does have history of diabetes as well as cirrhosis requiring lactulose.  Did miss lactulose on Wednesday due  to it being his birthday, significant other states he has been having normal bowel movements has been compliant with his meds otherwise.  No recent medication changes.  No slurred speech.  Was well up until today.  Found sitting on bathroom floor, confused.  On arrival with EMS he is altered, intermittently follows commands.  His heart and lungs are clear.  His abdomen is soft, nontender.  We will plan on labs, imaging and reassess  Labs and imaging personally reviewed and interpreted:  CBC without leukocytosis, platelets 88 similar to prior Potassium 5.3, glucose 220, bicarb 16 COVID, flu negative Osmolality 307 Lactic acid 3.2 CT head without significant abnormality CT cervical without significant normality Chest x-ray that significant normality  Unfortunate delay in some labs due to clotting, ammonia drawn x3  Patient reassessed.  Still confused.  Ammonia significantly elevated.  Will do lactulose enema.     Patient critically ill with suspected hepatic encephalopathy has likely cause of his altered mental status.  Will admit for further work-up and management.  Discussed results with partner in room.  Patient still continues to however does intermittently answer questions.  No seizure-like activity.    CONSULT with Dr. Loney Loh with [facility] who agrees to evaluate patient for admission.  He is on multiple medications at home I put in for pharmacy tech to prioritize his med rec. 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                           Medical Decision Making Amount and/or Complexity of Data Reviewed Independent Historian: spouse External Data Reviewed: labs, radiology and notes.    Details: last admission to [facility] 1/22 with AMS ammonia 116 Labs: ordered. Decision-making details documented in ED Course. Radiology: ordered and independent interpretation performed. Decision-making details documented in ED Course. ECG/medicine tests: ordered and independent interpretation performed. Decision-making details documented in ED Course.  Risk OTC drugs. Decision regarding hospitalization.         Final Clinical Impression(s) / ED Diagnoses Final diagnoses:  Altered mental status, unspecified altered mental status type  Hepatic encephalopathy  Elevated lactic acid level  Hyperkalemia    Rx / DC Orders ED Discharge Orders     None         Somer Trotter A, PA-C 12/29/21 2129    Cheryll Cockayne, MD 01/05/22 [phone]

## 2021-12-29 NOTE — Assessment & Plan Note (Addendum)
Stable.  No signs of acute exacerbation. -Resume home meds

## 2021-12-29 NOTE — Assessment & Plan Note (Addendum)
Metabolic acidosis Likely due to liver disease.  Lactic acidosis improved after IV fluid bolus.  No infectious signs or symptoms to suggest sepsis. - Acidosis improved.
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
## 2021-12-30 DIAGNOSIS — K7682 Hepatic encephalopathy: Secondary | ICD-10-CM | POA: Diagnosis not present

## 2021-12-30 DIAGNOSIS — I10 Essential (primary) hypertension: Secondary | ICD-10-CM

## 2021-12-30 DIAGNOSIS — E872 Acidosis, unspecified: Secondary | ICD-10-CM

## 2021-12-30 DIAGNOSIS — E119 Type 2 diabetes mellitus without complications: Secondary | ICD-10-CM

## 2021-12-30 DIAGNOSIS — D696 Thrombocytopenia, unspecified: Secondary | ICD-10-CM

## 2021-12-30 DIAGNOSIS — L732 Hidradenitis suppurativa: Secondary | ICD-10-CM

## 2021-12-30 LAB — CBC
HCT: 40.2 % (ref 39.0–52.0)
Hemoglobin: 14.4 g/dL (ref 13.0–17.0)
MCH: 33 pg (ref 26.0–34.0)
MCHC: 35.8 g/dL (ref 30.0–36.0)
MCV: 92 fL (ref 80.0–100.0)
Platelets: 78 10*3/uL — ABNORMAL LOW (ref 150–400)
RBC: 4.37 MIL/uL (ref 4.22–5.81)
RDW: 14.3 % (ref 11.5–15.5)
WBC: 5.7 10*3/uL (ref 4.0–10.5)
nRBC: 0 % (ref 0.0–0.2)

## 2021-12-30 LAB — BASIC METABOLIC PANEL
Anion gap: 9 (ref 5–15)
BUN: 18 mg/dL (ref 6–20)
CO2: 21 mmol/L — ABNORMAL LOW (ref 22–32)
Calcium: 9.7 mg/dL (ref 8.9–10.3)
Chloride: 113 mmol/L — ABNORMAL HIGH (ref 98–111)
Creatinine, Ser: 1 mg/dL (ref 0.61–1.24)
GFR, Estimated: >60 mL/min (ref >60)
Glucose, Bld: 113 mg/dL — ABNORMAL HIGH (ref 70–99)
Potassium: 4.5 mmol/L (ref 3.5–5.1)
Sodium: 143 mmol/L (ref 135–145)

## 2021-12-30 LAB — GLUCOSE, CAPILLARY
Glucose-Capillary: 116 mg/dL — ABNORMAL HIGH (ref 70–99)
Glucose-Capillary: 194 mg/dL — ABNORMAL HIGH (ref 70–99)
Glucose-Capillary: 143 mg/dL — ABNORMAL HIGH (ref 70–99)
Glucose-Capillary: 136 mg/dL — ABNORMAL HIGH (ref 70–99)

## 2021-12-30 LAB — CBG MONITORING, ED: Glucose-Capillary: 115 mg/dL — ABNORMAL HIGH (ref 70–99)

## 2021-12-30 LAB — HEMOGLOBIN A1C
Hgb A1c MFr Bld: 5.2 % (ref 4.8–5.6)
Mean Plasma Glucose: 102.54 mg/dL

## 2021-12-30 LAB — HIV ANTIBODY (ROUTINE TESTING W REFLEX): HIV Screen 4th Generation wRfx: NONREACTIVE

## 2021-12-30 LAB — AMMONIA: Ammonia: 77 umol/L — ABNORMAL HIGH (ref 9–35)

## 2021-12-30 LAB — MAGNESIUM: Magnesium: 1.8 mg/dL (ref 1.7–2.4)

## 2021-12-30 MED ORDER — LACTULOSE ENEMA
300.0000 mL | ORAL | Status: DC
  Administered 2021-12-30: 300 mL via RECTAL
  Filled 2021-12-30 (×5): qty 300

## 2021-12-30 MED ORDER — PANTOPRAZOLE SODIUM 40 MG PO TBEC
40.0000 mg | DELAYED_RELEASE_TABLET | Freq: Every day | ORAL | Status: DC
  Administered 2021-12-30 – 2021-12-31 (×2): 40 mg via ORAL
  Filled 2021-12-30 (×2): qty 1

## 2021-12-30 MED ORDER — ARFORMOTEROL TARTRATE 15 MCG/2ML IN NEBU
15.0000 ug | INHALATION_SOLUTION | Freq: Two times a day (BID) | RESPIRATORY_TRACT | Status: DC
  Administered 2021-12-30 – 2021-12-31 (×2): 15 ug via RESPIRATORY_TRACT
  Filled 2021-12-30 (×2): qty 2

## 2021-12-30 MED ORDER — TRAZODONE HCL 50 MG PO TABS
50.0000 mg | ORAL_TABLET | Freq: Once | ORAL | Status: AC
  Administered 2021-12-30: 50 mg via ORAL
  Filled 2021-12-30: qty 1

## 2021-12-30 MED ORDER — MONTELUKAST SODIUM 10 MG PO TABS
10.0000 mg | ORAL_TABLET | Freq: Every day | ORAL | Status: DC
  Administered 2021-12-30: 10 mg via ORAL
  Filled 2021-12-30: qty 1

## 2021-12-30 MED ORDER — ZOLPIDEM TARTRATE 5 MG PO TABS
5.0000 mg | ORAL_TABLET | Freq: Once | ORAL | Status: AC
  Administered 2021-12-30: 5 mg via ORAL
  Filled 2021-12-30: qty 1

## 2021-12-30 MED ORDER — ENOXAPARIN SODIUM 40 MG/0.4ML IJ SOSY
40.0000 mg | PREFILLED_SYRINGE | INTRAMUSCULAR | Status: DC
  Administered 2021-12-30: 40 mg via SUBCUTANEOUS
  Filled 2021-12-30: qty 0.4

## 2021-12-30 MED ORDER — FOLIC ACID 1 MG PO TABS
1.0000 mg | ORAL_TABLET | Freq: Every day | ORAL | Status: DC
  Administered 2021-12-30 – 2021-12-31 (×2): 1 mg via ORAL
  Filled 2021-12-30 (×2): qty 1

## 2021-12-30 MED ORDER — PROPRANOLOL HCL 10 MG PO TABS
10.0000 mg | ORAL_TABLET | Freq: Two times a day (BID) | ORAL | Status: DC
  Administered 2021-12-30 – 2021-12-31 (×2): 10 mg via ORAL
  Filled 2021-12-30 (×3): qty 1

## 2021-12-30 MED ORDER — MAGNESIUM OXIDE -MG SUPPLEMENT 400 (240 MG) MG PO TABS
400.0000 mg | ORAL_TABLET | Freq: Two times a day (BID) | ORAL | Status: DC
  Administered 2021-12-30 – 2021-12-31 (×2): 400 mg via ORAL
  Filled 2021-12-30 (×2): qty 1

## 2021-12-30 MED ORDER — LACTULOSE 10 GM/15ML PO SOLN
20.0000 g | Freq: Three times a day (TID) | ORAL | Status: DC
  Administered 2021-12-30 (×3): 20 g via ORAL
  Filled 2021-12-30 (×3): qty 30

## 2021-12-30 MED ORDER — MAGNESIUM OXIDE 400 MG PO TABS
400.0000 mg | ORAL_TABLET | Freq: Two times a day (BID) | ORAL | Status: DC
  Filled 2021-12-30: qty 1

## 2021-12-30 MED ORDER — UMECLIDINIUM BROMIDE 62.5 MCG/ACT IN AEPB
1.0000 | INHALATION_SPRAY | Freq: Every day | RESPIRATORY_TRACT | Status: DC
  Filled 2021-12-30: qty 7

## 2021-12-30 NOTE — Progress Notes (Signed)
Progress Note  Patient: [employer] Gaddie ZTI:[id number] DOB: September 07, 1966  DOA: 12/29/2021  DOS: 12/30/2021    Brief hospital course: Duanne Limerick is a 56 y.o. male with medical history significant of alcoholism in remission, alcoholic liver cirrhosis, esophageal varices, COPD/asthma, noninsulin-dependent type 2 diabetes, hypertension, chronic thrombocytopenia, GERD, hidradenitis suppurativa, OSA not on CPAP presented to the ED for evaluation of altered mental status.  Patient partner reported that his mental status was normal yesterday but today he appeared more somnolent.  He was found sitting on the bathroom floor, confused.  Reportedly taking lactulose regularly but did not take it 2 days ago as it was his birthday.  On EMS arrival, patient was altered, intermittently following commands.  In the ED, vital signs stable.  Labs showing WBC 5.9, hemoglobin 15.0, platelet count 88k (chronically low and stable).  Sodium 139, potassium 5.3, chloride 114, bicarb 16, anion gap 9, BUN 26, creatinine 1.2 (baseline 0.7-1.2), glucose 220.  LFTs normal.  INR 1.2.  UA and UDS pending.  Acetaminophen level <10.  Blood ethanol level <10.  COVID and flu negative.  Lactic acid 3.2 > 2.4.  Ammonia level 163.  Chest x-ray showing mild subsegmental bibasilar atelectasis.  CT head and C-spine negative for acute finding. Patient was given lactulose enema and 1 L normal saline bolus.   Patient is very confused and disoriented, not able to give any history.  Assessment and Plan: * Hepatic encephalopathy- (present on admission) Encephalopathy likely due to lactulose nonadherence.  Ammonia level significantly elevated. CT head negative for acute finding.  - Mentation improving, can transition enema > po today - Restart more po meds as mentation clears, start diet when able to safely.  Lactic acidosis- (present on admission) Metabolic acidosis Likely due to liver disease.  Lactic acidosis improved after  IV fluid bolus.  No infectious signs or symptoms to suggest sepsis. - Acidosis improved.  COPD (chronic obstructive pulmonary disease) (HCC)- (present on admission) Stable.  No signs of acute exacerbation. -Resume home inhalers (formulary equivalent) and singulair  Essential hypertension- (present on admission) Normotensive holding meds (lisinopril, spironolactone), will not reinitiate at this time except BB.  Non-insulin dependent type 2 diabetes mellitus (HCC) HbA1c is 5.2%. Lantus 30u on home med list with metformin. Holding both, at inpatient goal. Continue SSI, will likely augment once taking po.  Thrombocytopenia (HCC)- (present on admission) This is a chronic problem and likely related to liver disease. - Actually at increased risk of DVT, platelets stable > 50k, will start lovenox 40mg  q24h.  Hidradenitis suppurativa- (present on admission) Followed by dermatology at [facility] and on infliximab and chronic cefdinir. -Resume cefdinir once more alert  Subjective: More alert, asking to have mittens off, specifically his left (dominant hand). [facility] to questioning what his last name is.   Objective: Vitals:   12/30/21 0300 12/30/21 0745 12/30/21 1140 12/30/21 1523  BP: 119/73 134/88 120/70 116/69  Pulse: 89 97 94 88  Resp: 18 16 16 17   Temp: 97.6 F (36.4 C) 98.3 F (36.8 C) 98.5 F (36.9 C) 98.3 F (36.8 C)  TempSrc: Axillary Oral Oral Oral  SpO2: 99% 99% 98% 98%  Weight:      Height:       Gen: 56 y.o. male in no distress Pulm: Nonlabored breathing room air.  CV: Regular rate and rhythm. No murmur, rub, or gallop. No JVD, no significant dependent edema. GI: Abdomen soft, no distention or focal tenderness, with normoactive bowel sounds.  Ext:  Warm, no deformities Skin: No jaundice, no bleeding or significant rashes, lesions or ulcers on visualized skin. Neuro: Drowsy but rousable and responsive, incompletely oriented with mild asterixis. Redirectable, very slowed  cognition without focal deficits. Psych: Judgement and insight appear impaired.   Data Personally reviewed: Platelets stable/low  SCr 1.2 > 1.0, K 5.3 > 4.5, bicarb 16 > 21 LFTs wnl. INR 1.2  Ammonia 163 > 77.   No results for input(s): LIPASE, AMYLASE in the last 168 hours. Recent Labs  Lab 12/29/21 2014 12/30/21 0314  AMMONIA 163* 77*   Coagulation Profile: Recent Labs  Lab 12/29/21 1737  INR 1.2       CT Head Wo Contrast  Result Date: 12/29/2021 CLINICAL DATA:  Neck trauma, altered mental status, midline tenderness EXAM: CT HEAD WITHOUT CONTRAST CT CERVICAL SPINE WITHOUT CONTRAST TECHNIQUE: Multidetector CT imaging of the head and cervical spine was performed following the standard protocol without intravenous contrast. Multiplanar CT image reconstructions of the cervical spine were also generated. RADIATION DOSE REDUCTION: This exam was performed according to the departmental dose-optimization program which includes automated exposure control, adjustment of the mA and/or kV according to patient size and/or use of iterative reconstruction technique. COMPARISON:  12/18/2020 FINDINGS: Brain: No evidence of acute infarction, hemorrhage, extra-axial collection, ventriculomegaly, or mass effect. Generalized cerebral atrophy. Periventricular white matter low attenuation likely secondary to microangiopathy. Vascular: No significant cerebrovascular atherosclerotic calcifications. No hyperdense vessels. Skull: Negative for fracture or focal lesion. Sinuses/Orbits: Visualized portions of the orbits are unremarkable. Visualized portions of the paranasal sinuses are unremarkable. Visualized portions of the mastoid air cells are unremarkable. Other: None. CT CERVICAL SPINE FINDINGS Alignment: Normal. Skull base and vertebrae: No acute fracture. No primary bone lesion or focal pathologic process. Soft tissues and spinal canal: No prevertebral fluid or swelling. No visible canal hematoma. Disc levels:  Disc spaces are maintained. No foraminal or central canal stenosis. At C3-4 there is moderate left facet arthropathy and mild right facet arthropathy. At C4-5 there is moderate bilateral facet arthropathy. At C5-6 there is moderate bilateral facet arthropathy. At C6-7 there is moderate bilateral facet arthropathy. Upper chest: Lung apices are clear. Other: No fluid collection or hematoma. IMPRESSION: 1. No acute intracranial pathology. 2. No acute osseous injury of the cervical spine. 3. Facet arthropathy throughout the cervical spine. Electronically Signed   By: Elige KoHetal  Patel M.D.   On: 12/29/2021 19:19   CT Cervical Spine Wo Contrast  Result Date: 12/29/2021 CLINICAL DATA:  Neck trauma, altered mental status, midline tenderness EXAM: CT HEAD WITHOUT CONTRAST CT CERVICAL SPINE WITHOUT CONTRAST TECHNIQUE: Multidetector CT imaging of the head and cervical spine was performed following the standard protocol without intravenous contrast. Multiplanar CT image reconstructions of the cervical spine were also generated. RADIATION DOSE REDUCTION: This exam was performed according to the departmental dose-optimization program which includes automated exposure control, adjustment of the mA and/or kV according to patient size and/or use of iterative reconstruction technique. COMPARISON:  12/18/2020 FINDINGS: Brain: No evidence of acute infarction, hemorrhage, extra-axial collection, ventriculomegaly, or mass effect. Generalized cerebral atrophy. Periventricular white matter low attenuation likely secondary to microangiopathy. Vascular: No significant cerebrovascular atherosclerotic calcifications. No hyperdense vessels. Skull: Negative for fracture or focal lesion. Sinuses/Orbits: Visualized portions of the orbits are unremarkable. Visualized portions of the paranasal sinuses are unremarkable. Visualized portions of the mastoid air cells are unremarkable. Other: None. CT CERVICAL SPINE FINDINGS Alignment: Normal. Skull base  and vertebrae: No acute fracture. No primary bone lesion or focal pathologic  process. Soft tissues and spinal canal: No prevertebral fluid or swelling. No visible canal hematoma. Disc levels: Disc spaces are maintained. No foraminal or central canal stenosis. At C3-4 there is moderate left facet arthropathy and mild right facet arthropathy. At C4-5 there is moderate bilateral facet arthropathy. At C5-6 there is moderate bilateral facet arthropathy. At C6-7 there is moderate bilateral facet arthropathy. Upper chest: Lung apices are clear. Other: No fluid collection or hematoma. IMPRESSION: 1. No acute intracranial pathology. 2. No acute osseous injury of the cervical spine. 3. Facet arthropathy throughout the cervical spine. Electronically Signed   By: Elige Ko M.D.   On: 12/29/2021 19:19   DG CHEST PORT 1 VIEW  Result Date: 12/29/2021 CLINICAL DATA:  Altered mental status. EXAM: PORTABLE CHEST 1 VIEW COMPARISON:  12/18/2020. FINDINGS: Mild linear opacities at the lung bases. No confluent consolidation. No visible pleural effusions or pneumothorax. Cardiomediastinal silhouette is within normal limits. IMPRESSION: Mild subsegmental bibasilar atelectasis, similar. Electronically Signed   By: Feliberto Harts M.D.   On: 12/29/2021 18:02    SARS-CoV-2 PCR (2/3): Negative Influenza A/B: Negative  Family Communication: None at bedside  Disposition: Status is: Inpatient Remains inpatient appropriate because: Persistent AMS Planned Discharge Destination: Home  Tyrone Nine, MD 12/30/2021 4:35 PM Page by [email or web address]

## 2021-12-31 ENCOUNTER — Encounter (HOSPITAL_COMMUNITY): Payer: Self-pay | Admitting: Internal Medicine

## 2021-12-31 DIAGNOSIS — I10 Essential (primary) hypertension: Secondary | ICD-10-CM | POA: Diagnosis not present

## 2021-12-31 DIAGNOSIS — L732 Hidradenitis suppurativa: Secondary | ICD-10-CM | POA: Diagnosis not present

## 2021-12-31 DIAGNOSIS — E872 Acidosis, unspecified: Secondary | ICD-10-CM | POA: Diagnosis not present

## 2021-12-31 DIAGNOSIS — K7682 Hepatic encephalopathy: Secondary | ICD-10-CM | POA: Diagnosis not present

## 2021-12-31 LAB — GLUCOSE, CAPILLARY
Glucose-Capillary: 133 mg/dL — ABNORMAL HIGH (ref 70–99)
Glucose-Capillary: 119 mg/dL — ABNORMAL HIGH (ref 70–99)
Glucose-Capillary: 132 mg/dL — ABNORMAL HIGH (ref 70–99)

## 2021-12-31 MED ORDER — LACTULOSE 10 GM/15ML PO SOLN
30.0000 g | Freq: Three times a day (TID) | ORAL | Status: DC
  Administered 2021-12-31: 30 g via ORAL
  Filled 2021-12-31: qty 60

## 2021-12-31 MED ORDER — LIDOCAINE 5 % EX PTCH
1.0000 | MEDICATED_PATCH | CUTANEOUS | Status: DC
  Administered 2021-12-31: 1 via TRANSDERMAL
  Filled 2021-12-31: qty 1

## 2021-12-31 MED ORDER — LACTULOSE 10 GM/15ML PO SOLN: 20.0000 g | Freq: Two times a day (BID) | ORAL | 0 refills | Status: DC

## 2021-12-31 NOTE — Evaluation (Signed)
Physical Therapy Evaluation Patient Details Name: Michael [name] MRN: [id number] DOB: 06-18-1966 Today's Date: 12/31/2021  History of Present Illness  56 y.o. male presented to the ED 12/29/21 for evaluation of altered mental status. CT head and C-spine negative for acute finding. Likely hepatic encephalopathy from lactulose nonadherence.  PMH significant of alcoholism in remission, alcoholic liver cirrhosis, esophageal varices, COPD/asthma, noninsulin-dependent type 2 diabetes, hypertension, chronic thrombocytopenia, GERD, hidradenitis suppurativa, OSA not on CPAP  Clinical Impression   Pt admitted secondary to problem above with deficits below. PTA patient was on disability and spent day lying in bed and watching TV. He could walk independently. Partner does cooking/cleaning/etc.  Pt currently requires minguard assist for safety due to decr cognition and pain from bil LE neuropathy. He was not unsteady, but does have an antalgic gait and admits to being "clutzy" with +fall in past 6 months due to imbalance. Likely would walk better in his shoes (today used hospital socks on tile floor and pt reported this increased his foot pain bil).  Anticipate patient will benefit from PT to address problems listed below. Will continue to follow acutely to maximize functional mobility independence and safety.          Recommendations for follow up therapy are one component of a multi-disciplinary discharge planning process, led by the attending physician.  Recommendations may be updated based on patient status, additional functional criteria and insurance authorization.  Follow Up Recommendations No PT follow up    Assistance Recommended at Discharge PRN  Patient can return home with the following  A little help with walking and/or transfers;Assistance with cooking/housework;Help with stairs or ramp for entrance    Equipment Recommendations None recommended by PT  Recommendations for Other  Services  OT consult    Functional Status Assessment Patient has had a recent decline in their functional status and demonstrates the ability to make significant improvements in function in a reasonable and predictable amount of time.     Precautions / Restrictions Precautions Precautions: Fall      Mobility  Bed Mobility Overal bed mobility: Modified Independent             General bed mobility comments: incr time/effort (no rail)    Transfers Overall transfer level: Needs assistance Equipment used: None Transfers: Sit to/from Stand Sit to Stand: Supervision           General transfer comment: supervision for safety due to decr cognition    Ambulation/Gait Ambulation/Gait assistance: Min guard Gait Distance (Feet): 100 Feet Assistive device: None Gait Pattern/deviations: Antalgic       General Gait Details: reporting neuropathic pain walking on tile floors in hospital socks; no imbalance, but antalgic gait  Stairs            Wheelchair Mobility    Modified Rankin (Stroke Patients Only)       Balance Overall balance assessment: History of Falls, No apparent balance deficits (not formally assessed)                                           Pertinent Vitals/Pain Pain Assessment Pain Assessment: Faces Faces Pain Scale: Hurts even more Pain Location: bil feet (neuropathy); buttocks when sitting Pain Descriptors / Indicators: Burning, Discomfort, Grimacing Pain Intervention(s): Limited activity within patient's tolerance, Monitored during session, Repositioned    Home Living Family/patient expects to be discharged to:: Private  residence Living Arrangements: Spouse/significant other (partner) Available Help at Discharge: Family Type of Home: House Home Access: Stairs to enter Entrance Stairs-Rails: None Secretary/administrator of Steps: 2   Home Layout: One level Home Equipment: None      Prior Function Prior Level of  Function : Independent/Modified Independent (disabled)             Mobility Comments: "very clutzy" with one fall in 6 months (LOB outside); on disability--stays home all day; lays in the bed due to painful bottom from hidrandenitis       Hand Dominance        Extremity/Trunk Assessment   Upper Extremity Assessment Upper Extremity Assessment: Defer to OT evaluation    Lower Extremity Assessment Lower Extremity Assessment: RLE deficits/detail;LLE deficits/detail RLE Sensation: history of peripheral neuropathy LLE Sensation: history of peripheral neuropathy    Cervical / Trunk Assessment Cervical / Trunk Assessment: Normal  Communication   Communication: No difficulties  Cognition Arousal/Alertness: Awake/alert Behavior During Therapy: Flat affect Overall Cognitive Status: No family/caregiver present to determine baseline cognitive functioning                                 General Comments: decr safety awareness on return to sit EOB-as buttocks touched bed, pt threw himself backwards (hip and trunk extension) with head/neck striking bed rail on opposite side of bed. States he was having severe pain in sitting and trying to lie back. RN at bedside and witnessed        General Comments General comments (skin integrity, edema, etc.): After pt threw himself into supine due to buttocks pain, he reported he never sits at home--he lies in bed all day to avoid pain    Exercises     Assessment/Plan    PT Assessment Patient needs continued PT services  PT Problem List Decreased activity tolerance;Decreased balance;Decreased mobility;Decreased cognition;Decreased safety awareness;Impaired sensation;Pain       PT Treatment Interventions DME instruction;Gait training;Stair training;Functional mobility training;Therapeutic activities;Therapeutic exercise;Balance training;Cognitive remediation;Patient/family education    PT Goals (Current goals can be found in  the Care Plan section)  Acute Rehab PT Goals Patient Stated Goal: return home PT Goal Formulation: With patient Time For Goal Achievement: 01/14/22 Potential to Achieve Goals: Good    Frequency Min 3X/week     Co-evaluation               AM-PAC PT "6 Clicks" Mobility  Outcome Measure Help needed turning from your back to your side while in a flat bed without using bedrails?: None Help needed moving from lying on your back to sitting on the side of a flat bed without using bedrails?: None Help needed moving to and from a bed to a chair (including a wheelchair)?: A Little Help needed standing up from a chair using your arms (e.g., wheelchair or bedside chair)?: A Little Help needed to walk in hospital room?: A Little Help needed climbing 3-5 steps with a railing? : A Little 6 Click Score: 20    End of Session Equipment Utilized During Treatment: Gait belt Activity Tolerance: Patient limited by pain Patient left: in bed;with call bell/phone within reach;with bed alarm set Nurse Communication: Mobility status;Other (comment) (struck back of neck/?head on bedrail) PT Visit Diagnosis: Unsteadiness on feet (R26.81);History of falling (Z91.81);Pain Pain - Right/Left:  (bil) Pain - part of body: Leg;Ankle and joints of foot    Time: 5320-2334 PT Time Calculation (  min) (ACUTE ONLY): 18 min   Charges:   PT Evaluation $PT Eval Low Complexity: 1 Low           Jerolyn Center, PT Acute Rehabilitation Services  Pager [phone] Office [phone]   Zena Amos 12/31/2021, 10:54 AM

## 2021-12-31 NOTE — Progress Notes (Signed)
Discharge home after IV removed and discharge instructions reviewed with patient and partner. All questions answered.

## 2021-12-31 NOTE — Plan of Care (Signed)

## 2021-12-31 NOTE — Progress Notes (Signed)
OT Cancellation Note  Patient Details Name: Michael [name] MRN: [id number] DOB: Nov 17, 1966   Cancelled Treatment:    Reason Eval/Treat Not Completed: PT screened, no needs identified, will sign off. PT evaluated pt and reports that he is near/at his baseline and has no OT needs at this time. Acute OT will sign off at this time. Please re-consult if needs change.   Michael [name] H., OTR/L Acute Rehabilitation  Ksenia Kunz Elane Bing Plume 12/31/2021, 1:45 PM

## 2021-12-31 NOTE — Discharge Summary (Signed)
Physician Discharge Summary   Patient: Michael [name] MRN: [id number] DOB: 1966/07/24  Admit date:     12/29/2021  Discharge date: 12/31/21  Discharge Physician: Tyrone Nine   PCP: Ermalinda Memos, MD   Recommendations at discharge:   Follow up with PCP in 1-2 weeks. Patient admitted for hepatic encephalopathy due to incomplete adherence to lactulose home medication. This was, in part, due to profuse diarrhea. With shared decision making to improve adherence, the prescribed dose was decreased to 20g BID with directions to titrate to 2-3 BMs per day.   Discharge Diagnoses: Principal Problem:   Hepatic encephalopathy Active Problems:   Hidradenitis suppurativa   Thrombocytopenia (HCC)   Non-insulin dependent type 2 diabetes mellitus (HCC)   Essential hypertension   COPD (chronic obstructive pulmonary disease) (HCC)   Lactic acidosis  Resolved Problems:   * No resolved hospital problems. *   Hospital Course: HPI: Michael [name] is a 56 y.o. male with medical history significant of alcoholism in remission, alcoholic liver cirrhosis, esophageal varices, COPD/asthma, noninsulin-dependent type 2 diabetes, hypertension, chronic thrombocytopenia, GERD, hidradenitis suppurativa, OSA not on CPAP presented to the ED for evaluation of altered mental status.  Patient partner reported that his mental status was normal yesterday but today he appeared more somnolent.  He was found sitting on the bathroom floor, confused.  Reportedly taking lactulose regularly but did not take it 2 days ago as it was his birthday.  On EMS arrival, patient was altered, intermittently following commands.  In the ED, vital signs stable.  Labs showing WBC 5.9, hemoglobin 15.0, platelet count 88k (chronically low and stable).  Sodium 139, potassium 5.3, chloride 114, bicarb 16, anion gap 9, BUN 26, creatinine 1.2 (baseline 0.7-1.2), glucose 220.  LFTs normal.  INR 1.2.  UA and UDS pending.  Acetaminophen  level <10.  Blood ethanol level <10.  COVID and flu negative.  Lactic acid 3.2 > 2.4.  Ammonia level 163.  Chest x-ray showing mild subsegmental bibasilar atelectasis.  CT head and C-spine negative for acute finding. Patient was given lactulose enema and 1 L normal saline bolus. He remained significantly encephalopathic until the following day. With improvement lactulose enema was changed to oral formulation with continued mental clearing. On 2/5 he has returned to his cognitive baseline and is requesting discharge. Lactulose dosing has been adjusted as discussed, and he is discharged in stable condition.  Assessment and Plan: * Hepatic encephalopathy- (present on admission) Encephalopathy likely due to lactulose nonadherence.  Ammonia level significantly elevated. CT head negative for acute finding. Mentation normalized as of 2/5, though having profuse diarrhea. Will deescalate dose at home in hopes of improved adherence to avoid rehospitalization and improved quality of life with decreased diarrhea.  Lactic acidosis- (present on admission) Metabolic acidosis Likely due to liver disease.  Lactic acidosis improved after IV fluid bolus.  No infectious signs or symptoms to suggest sepsis. - Acidosis improved.  COPD (chronic obstructive pulmonary disease) (HCC)- (present on admission) Stable.  No signs of acute exacerbation. -Resume home meds  Essential hypertension- (present on admission) Continue home medications  Non-insulin dependent type 2 diabetes mellitus (HCC) HbA1c is 5.2%. No change to home medications made at discharge, though may indicate overly tight control and should be followed by PCP.  Thrombocytopenia (HCC)- (present on admission) This is a chronic problem and likely related to liver disease. Stable without evidence of bleeding  Hidradenitis suppurativa- (present on admission) Followed [facility]ology at [facility] and on infliximab  -  Resume cefdinir chronica med   Consultants:  None Procedures performed: None  Disposition: Home Diet recommendation:  Cardiac and Carb modified diet  DISCHARGE MEDICATION: Allergies as of 12/31/2021       Reactions   Cat Hair Extract Anaphylaxis   Iodine-131 Shortness Of Breath, Other (See Comments)   MRI contrast dye   Penicillins Rash, Other (See Comments)   Has patient had a PCN reaction causing immediate rash, facial/tongue/throat swelling, SOB or lightheadedness with hypotension: yes Has patient had a PCN reaction causing severe rash involving mucus membranes or skin necrosis: no Has patient had a PCN reaction that required hospitalization: no Has patient had a PCN reaction occurring within the last 10 years: no If all of the above answers are "NO", then may proceed with Cephalosporin use.   Nickel Itching, Other (See Comments)   redness   Chlorhexidine Rash   Latex Rash        Medication List     TAKE these medications    cefdinir 300 MG capsule Commonly known as: OMNICEF Take 300 mg by mouth 2 (two) times daily. Continuous   celecoxib 200 MG capsule Commonly known as: CeleBREX Take 1 capsule (200 mg total) by mouth 2 (two) times daily after a meal.   diclofenac Sodium 1 % Gel Commonly known as: VOLTAREN APPLY 2 GRAMS TO AFFECTED AREA 4 TIMES A DAY What changed: See the new instructions.   Farxiga 5 MG Tabs tablet Generic drug: dapagliflozin propanediol Take 5 mg by mouth daily.   ferrous sulfate 325 (65 FE) MG tablet Take 325 mg by mouth daily before supper.   fluocinonide 0.05 % external solution Commonly known as: LIDEX Apply 1 application topically 2 (two) times daily as needed for irritation or rash.   folic acid 1 MG tablet Commonly known as: FOLVITE Take 1 mg by mouth daily.   FreeStyle Libre 14 Day Sensor Misc 1 each by Does not apply route as directed.   HYDROcodone-acetaminophen 7.5-325 MG tablet Commonly known as: NORCO Take 0.5-1 tablets by mouth 2 (two) times daily as needed for  pain.   insulin glargine 100 UNIT/ML injection Commonly known as: LANTUS Inject 30 Units into the skin daily.   ketoconazole 2 % shampoo Commonly known as: NIZORAL Apply 1 application topically 2 (two) times a week.   lactulose 10 GM/15ML solution Commonly known as: CHRONULAC Take 30 mLs (20 g total) by mouth in the morning and at bedtime. What changed: how much to take   lisinopril 5 MG tablet Commonly known as: ZESTRIL Take 5 mg by mouth daily.   magnesium oxide 400 MG tablet Commonly known as: MAG-OX Take 400 mg by mouth 2 (two) times daily.   metFORMIN 500 MG 24 hr tablet Commonly known as: GLUCOPHAGE-XR Take 500 mg by mouth in the morning and at bedtime. What changed: Another medication with the same name was removed. Continue taking this medication, and follow the directions you see here.   montelukast 10 MG tablet Commonly known as: SINGULAIR Take 2 tablets (20 mg total) by mouth at bedtime. What changed: how much to take   omeprazole 20 MG capsule Commonly known as: PRILOSEC Take 1 capsule (20 mg total) by mouth 2 (two) times daily.   ondansetron 8 MG tablet Commonly known as: ZOFRAN Take 8 mg by mouth every 8 (eight) hours as needed for nausea or vomiting.   propranolol 10 MG tablet Commonly known as: INDERAL Take 10 mg by mouth 2 (two) times daily.  REMICADE IV Inject 1 Dose into the vein every 6 (six) weeks.   rosuvastatin 5 MG tablet Commonly known as: CRESTOR Take 5 mg by mouth daily.   spironolactone 25 MG tablet Commonly known as: ALDACTONE Take 25 mg by mouth daily.   Stiolto Respimat 2.5-2.5 MCG/ACT Aers Generic drug: Tiotropium Bromide-Olodaterol Take 2 puffs by mouth daily.   traZODone 100 MG tablet Commonly known as: DESYREL Take 100 mg by mouth at bedtime.   zolpidem 10 MG tablet Commonly known as: AMBIEN Take 10 mg by mouth at bedtime.        Follow-up Information     Ermalinda Memos, MD. Schedule an appointment as soon as  possible for a visit in 2 week(s).   Specialty: Internal Medicine Contact information: [address] [place] [address] [phone]                 Discharge Exam: Ceasar Mons Weights   12/29/21 1730 12/30/21 0113  Weight: 92.5 kg 91.7 kg   No distress, pleasant, alert, oriented, conversant without asterixis or focal deficits  Condition at discharge: good  The results of significant diagnostics from this hospitalization (including imaging, microbiology, ancillary and laboratory) are listed below for reference.   Imaging Studies: CT Head Wo Contrast  Result Date: 12/29/2021 CLINICAL DATA:  Neck trauma, altered mental status, midline tenderness EXAM: CT HEAD WITHOUT CONTRAST CT CERVICAL SPINE WITHOUT CONTRAST TECHNIQUE: Multidetector CT imaging of the head and cervical spine was performed following the standard protocol without intravenous contrast. Multiplanar CT image reconstructions of the cervical spine were also generated. RADIATION DOSE REDUCTION: This exam was performed according to the departmental dose-optimization program which includes automated exposure control, adjustment of the mA and/or kV according to patient size and/or use of iterative reconstruction technique. COMPARISON:  12/18/2020 FINDINGS: Brain: No evidence of acute infarction, hemorrhage, extra-axial collection, ventriculomegaly, or mass effect. Generalized cerebral atrophy. Periventricular white matter low attenuation likely secondary to microangiopathy. Vascular: No significant cerebrovascular atherosclerotic calcifications. No hyperdense vessels. Skull: Negative for fracture or focal lesion. Sinuses/Orbits: Visualized portions of the orbits are unremarkable. Visualized portions of the paranasal sinuses are unremarkable. Visualized portions of the mastoid air cells are unremarkable. Other: None. CT CERVICAL SPINE FINDINGS Alignment: Normal. Skull base and vertebrae: No acute fracture. No primary bone  lesion or focal pathologic process. Soft tissues and spinal canal: No prevertebral fluid or swelling. No visible canal hematoma. Disc levels: Disc spaces are maintained. No foraminal or central canal stenosis. At C3-4 there is moderate left facet arthropathy and mild right facet arthropathy. At C4-5 there is moderate bilateral facet arthropathy. At C5-6 there is moderate bilateral facet arthropathy. At C6-7 there is moderate bilateral facet arthropathy. Upper chest: Lung apices are clear. Other: No fluid collection or hematoma. IMPRESSION: 1. No acute intracranial pathology. 2. No acute osseous injury of the cervical spine. 3. Facet arthropathy throughout the cervical spine. Electronically Signed   By: Elige Ko M.D.   On: 12/29/2021 19:19   CT Cervical Spine Wo Contrast  Result Date: 12/29/2021 CLINICAL DATA:  Neck trauma, altered mental status, midline tenderness EXAM: CT HEAD WITHOUT CONTRAST CT CERVICAL SPINE WITHOUT CONTRAST TECHNIQUE: Multidetector CT imaging of the head and cervical spine was performed following the standard protocol without intravenous contrast. Multiplanar CT image reconstructions of the cervical spine were also generated. RADIATION DOSE REDUCTION: This exam was performed according to the departmental dose-optimization program which includes automated exposure control, adjustment of the mA and/or kV according to patient  size and/or use of iterative reconstruction technique. COMPARISON:  12/18/2020 FINDINGS: Brain: No evidence of acute infarction, hemorrhage, extra-axial collection, ventriculomegaly, or mass effect. Generalized cerebral atrophy. Periventricular white matter low attenuation likely secondary to microangiopathy. Vascular: No significant cerebrovascular atherosclerotic calcifications. No hyperdense vessels. Skull: Negative for fracture or focal lesion. Sinuses/Orbits: Visualized portions of the orbits are unremarkable. Visualized portions of the paranasal sinuses are  unremarkable. Visualized portions of the mastoid air cells are unremarkable. Other: None. CT CERVICAL SPINE FINDINGS Alignment: Normal. Skull base and vertebrae: No acute fracture. No primary bone lesion or focal pathologic process. Soft tissues and spinal canal: No prevertebral fluid or swelling. No visible canal hematoma. Disc levels: Disc spaces are maintained. No foraminal or central canal stenosis. At C3-4 there is moderate left facet arthropathy and mild right facet arthropathy. At C4-5 there is moderate bilateral facet arthropathy. At C5-6 there is moderate bilateral facet arthropathy. At C6-7 there is moderate bilateral facet arthropathy. Upper chest: Lung apices are clear. Other: No fluid collection or hematoma. IMPRESSION: 1. No acute intracranial pathology. 2. No acute osseous injury of the cervical spine. 3. Facet arthropathy throughout the cervical spine. Electronically Signed   By: Elige KoHetal  Patel M.D.   On: 12/29/2021 19:19   DG CHEST PORT 1 VIEW  Result Date: 12/29/2021 CLINICAL DATA:  Altered mental status. EXAM: PORTABLE CHEST 1 VIEW COMPARISON:  12/18/2020. FINDINGS: Mild linear opacities at the lung bases. No confluent consolidation. No visible pleural effusions or pneumothorax. Cardiomediastinal silhouette is within normal limits. IMPRESSION: Mild subsegmental bibasilar atelectasis, similar. Electronically Signed   By: Feliberto HartsFrederick S Jones M.D.   On: 12/29/2021 18:02    Microbiology: Results for orders placed or performed during the hospital encounter of 12/29/21  Resp Panel by RT-PCR (Flu A&B, Covid) Nasopharyngeal Swab     Status: None   Collection Time: 12/29/21  5:55 PM   Specimen: Nasopharyngeal Swab; Nasopharyngeal(NP) swabs in vial transport medium  Result Value Ref Range Status   SARS Coronavirus 2 by RT PCR NEGATIVE NEGATIVE Final    Comment: (NOTE) SARS-CoV-2 target nucleic acids are NOT DETECTED.  The SARS-CoV-2 RNA is generally detectable in upper respiratory specimens  during the acute phase of infection. The lowest concentration of SARS-CoV-2 viral copies this assay can detect is 138 copies/mL. A negative result does not preclude SARS-Cov-2 infection and should not be used as the sole basis for treatment or other patient management decisions. A negative result may occur with  improper specimen collection/handling, submission of specimen other than nasopharyngeal swab, presence of viral mutation(s) within the areas targeted by this assay, and inadequate number of viral copies(<138 copies/mL). A negative result must be combined with clinical observations, patient history, and epidemiological information. The expected result is Negative.  Fact Sheet for Patients:  BloggerCourse.comhttps://www.fda.gov/media/152166/download  Fact Sheet for Healthcare Providers:  SeriousBroker.ithttps://www.fda.gov/media/152162/download  This test is no t yet approved or cleared by the [place] FDA and  has been authorized for detection and/or diagnosis of SARS-CoV-2 by FDA under an Emergency Use Authorization (EUA). This EUA will remain  in effect (meaning this test can be used) for the duration of the COVID-19 declaration under Section 564(b)(1) of the Act, 21 U.S.C.section 360bbb-3(b)(1), unless the authorization is terminated  or revoked sooner.       Influenza A by PCR NEGATIVE NEGATIVE Final   Influenza B by PCR NEGATIVE NEGATIVE Final    Comment: (NOTE) The Xpert Xpress SARS-CoV-2/FLU/RSV plus assay is intended as an aid in the diagnosis of influenza  from Nasopharyngeal swab specimens and should not be used as a sole basis for treatment. Nasal washings and aspirates are unacceptable for Xpert Xpress SARS-CoV-2/FLU/RSV testing.  Fact Sheet for Patients: BloggerCourse.comhttps://www.fda.gov/media/152166/download  Fact Sheet for Healthcare Providers: SeriousBroker.ithttps://www.fda.gov/media/152162/download  This test is not yet approved or cleared by the [place] FDA and has been authorized for detection  and/or diagnosis of SARS-CoV-2 by FDA under an Emergency Use Authorization (EUA). This EUA will remain in effect (meaning this test can be used) for the duration of the COVID-19 declaration under Section 564(b)(1) of the Act, 21 U.S.C. section 360bbb-3(b)(1), unless the authorization is terminated or revoked.  Performed at [facility], [address] [place], [place] [address]     Labs: CBC: Recent Labs  Lab 12/29/21 1737 12/30/21 0314  WBC 5.9 5.7  HGB 15.0 14.4  HCT 43.4 40.2  MCV 95.8 92.0  PLT 88* 78*   Basic Metabolic Panel: Recent Labs  Lab 12/29/21 1737 12/30/21 0314  NA 139 143  K 5.3* 4.5  CL 114* 113*  CO2 16* 21*  GLUCOSE 220* 113*  BUN 26* 18  CREATININE 1.20 1.00  CALCIUM 9.5 9.7  MG  --  1.8   Liver Function Tests: Recent Labs  Lab 12/29/21 1737  AST 34  ALT 27  ALKPHOS 70  BILITOT 1.2  PROT 7.4  ALBUMIN 3.9   CBG: Recent Labs  Lab 12/30/21 1141 12/30/21 1706 12/30/21 2134 12/31/21 0611 12/31/21 0835  GLUCAP 194* 143* 136* 133* 119*    Discharge time spent: greater than 30 minutes.  Signed: Tyrone Nineyan B Gardner Servantes, MD [facility] 12/31/2021

## 2022-01-01 LAB — GLUCOSE, CAPILLARY: Glucose-Capillary: 120 mg/dL — ABNORMAL HIGH (ref 70–99)

## 2022-01-05 ENCOUNTER — Encounter: Payer: Self-pay | Admitting: Specialist

## 2022-01-05 ENCOUNTER — Other Ambulatory Visit: Payer: Self-pay | Admitting: Specialist

## 2022-01-05 ENCOUNTER — Other Ambulatory Visit: Payer: Self-pay

## 2022-01-05 ENCOUNTER — Ambulatory Visit: Payer: Self-pay

## 2022-01-05 ENCOUNTER — Ambulatory Visit (INDEPENDENT_AMBULATORY_CARE_PROVIDER_SITE_OTHER): Payer: BC Managed Care – PPO

## 2022-01-05 ENCOUNTER — Ambulatory Visit (INDEPENDENT_AMBULATORY_CARE_PROVIDER_SITE_OTHER): Payer: BC Managed Care – PPO | Admitting: Specialist

## 2022-01-05 VITALS — BP 107/69 | HR 84 | Ht 70.0 in | Wt 204.0 lb

## 2022-01-05 DIAGNOSIS — M545 Low back pain, unspecified: Secondary | ICD-10-CM

## 2022-01-05 DIAGNOSIS — M25511 Pain in right shoulder: Secondary | ICD-10-CM

## 2022-01-05 DIAGNOSIS — M5441 Lumbago with sciatica, right side: Secondary | ICD-10-CM | POA: Diagnosis not present

## 2022-01-05 DIAGNOSIS — M629 Disorder of muscle, unspecified: Secondary | ICD-10-CM

## 2022-01-05 DIAGNOSIS — M19071 Primary osteoarthritis, right ankle and foot: Secondary | ICD-10-CM | POA: Diagnosis not present

## 2022-01-05 DIAGNOSIS — M7521 Bicipital tendinitis, right shoulder: Secondary | ICD-10-CM

## 2022-01-05 DIAGNOSIS — M546 Pain in thoracic spine: Secondary | ICD-10-CM

## 2022-01-05 DIAGNOSIS — Z981 Arthrodesis status: Secondary | ICD-10-CM | POA: Diagnosis not present

## 2022-01-05 NOTE — Addendum Note (Signed)
Addended by: Vira Browns on: 01/05/2022 01:29 PM   Modules accepted: Orders

## 2022-01-05 NOTE — Patient Instructions (Addendum)
Avoid bending, stooping and avoid lifting weights greater than 10 lbs. Avoid prolong standing and walking. Avoid frequent bending and stooping  No lifting greater than 10 lbs. May use ice or moist heat for pain. Weight loss is of benefit. Handicap license is approved. Avoid overhead lifting and overhead use of the arms. Pillows to keep from sleeping directly on the shoulders Limited lifting to less than 10 lbs. Ice or heat for relief. Stretching exercise help and strengthening is helpful to build endurance. Michael [name] should consider the use of alternative medication that does not have tylenol since you are  Requiring treatment for increased ammonia level due to hepatic insufficiency.
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
## 2022-01-05 NOTE — Progress Notes (Signed)
Xr lum

## 2022-01-15 ENCOUNTER — Encounter: Payer: Self-pay | Admitting: Specialist

## 2022-01-16 NOTE — Addendum Note (Signed)
Addended by: Vira Browns on: 01/16/2022 02:55 PM   Modules accepted: Orders

## 2022-02-09 ENCOUNTER — Encounter: Payer: Self-pay | Admitting: Specialist

## 2022-02-09 ENCOUNTER — Ambulatory Visit (INDEPENDENT_AMBULATORY_CARE_PROVIDER_SITE_OTHER): Payer: BC Managed Care – PPO | Admitting: Specialist

## 2022-02-09 ENCOUNTER — Other Ambulatory Visit: Payer: Self-pay

## 2022-02-09 VITALS — BP 127/85 | HR 85 | Ht 70.0 in | Wt 204.0 lb

## 2022-02-09 DIAGNOSIS — S99911D Unspecified injury of right ankle, subsequent encounter: Secondary | ICD-10-CM

## 2022-02-09 DIAGNOSIS — Z981 Arthrodesis status: Secondary | ICD-10-CM | POA: Diagnosis not present

## 2022-02-09 DIAGNOSIS — S93491A Sprain of other ligament of right ankle, initial encounter: Secondary | ICD-10-CM | POA: Diagnosis not present

## 2022-02-09 DIAGNOSIS — M5441 Lumbago with sciatica, right side: Secondary | ICD-10-CM

## 2022-02-09 NOTE — Patient Instructions (Signed)
?  Plan: Avoid bending, stooping and avoid lifting weights greater than 10 lbs. ?Avoid prolong standing and walking. ?Avoid frequent bending and stooping  ?No lifting greater than 10 lbs. ?May use ice or moist heat for pain. ?Weight loss is of benefit. ?Handicap license is approved. ?Use an ankle sprain walker on the right ankle. ?MRI of the lumbar spine is recommended. ?
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
## 2022-02-17 ENCOUNTER — Other Ambulatory Visit (HOSPITAL_BASED_OUTPATIENT_CLINIC_OR_DEPARTMENT_OTHER): Payer: BC Managed Care – PPO

## 2022-02-21 ENCOUNTER — Other Ambulatory Visit: Payer: Self-pay

## 2022-02-21 ENCOUNTER — Ambulatory Visit
Admission: RE | Admit: 2022-02-21 | Discharge: 2022-02-21 | Disposition: A | Discharge status: Home / Self Care | Payer: BC Managed Care – PPO | Source: Ambulatory Visit | Attending: Specialist | Admitting: Specialist

## 2022-02-21 DIAGNOSIS — Z981 Arthrodesis status: Secondary | ICD-10-CM

## 2022-02-21 MED ORDER — GADOBENATE DIMEGLUMINE 529 MG/ML IV SOLN
19.0000 mL | Freq: Once | INTRAVENOUS | Status: AC | PRN
  Administered 2022-02-21: 19 mL via INTRAVENOUS

## 2022-03-05 ENCOUNTER — Other Ambulatory Visit: Payer: Self-pay | Admitting: Specialist

## 2022-03-07 ENCOUNTER — Telehealth: Payer: Self-pay | Admitting: Specialist

## 2022-03-07 ENCOUNTER — Ambulatory Visit (INDEPENDENT_AMBULATORY_CARE_PROVIDER_SITE_OTHER): Payer: BC Managed Care – PPO | Admitting: Specialist

## 2022-03-07 ENCOUNTER — Encounter: Payer: Self-pay | Admitting: Specialist

## 2022-03-07 VITALS — BP 105/69 | HR 73 | Ht 70.0 in | Wt 200.0 lb

## 2022-03-07 DIAGNOSIS — Z981 Arthrodesis status: Secondary | ICD-10-CM

## 2022-03-07 DIAGNOSIS — M5416 Radiculopathy, lumbar region: Secondary | ICD-10-CM
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
## 2022-03-07 NOTE — Telephone Encounter (Signed)
Patient called needing an appointment with Dr. Louanne Skye. Patient said he was referred to Dr. Louanne Skye for his back. The number to contact patient is [phone] ?

## 2022-03-07 NOTE — Patient Instructions (Signed)
Avoid bending, stooping and avoid lifting weights greater than 10 lbs. Avoid prolong standing and walking. Avoid frequent bending and stooping  No lifting greater than 10 lbs. May use ice or moist heat for pain. Weight loss is of benefit. Handicap license is approved. Dr. Newton's secretary/Assistant will call to arrange for epidural steroid injection  

## 2022-03-13 ENCOUNTER — Encounter: Payer: Self-pay | Admitting: Specialist

## 2022-03-19 ENCOUNTER — Encounter: Payer: Self-pay | Admitting: Physical Medicine and Rehabilitation

## 2022-03-19 ENCOUNTER — Ambulatory Visit (INDEPENDENT_AMBULATORY_CARE_PROVIDER_SITE_OTHER): Payer: BC Managed Care – PPO | Admitting: Physical Medicine and Rehabilitation

## 2022-03-19 VITALS — BP 105/68 | HR 80

## 2022-03-19 DIAGNOSIS — Z981 Arthrodesis status: Secondary | ICD-10-CM

## 2022-03-19 DIAGNOSIS — M5416 Radiculopathy, lumbar region: Secondary | ICD-10-CM | POA: Diagnosis not present

## 2022-03-19 DIAGNOSIS — M47816 Spondylosis without myelopathy or radiculopathy, lumbar region: Secondary | ICD-10-CM | POA: Diagnosis not present

## 2022-03-19 DIAGNOSIS — M4726 Other spondylosis with radiculopathy, lumbar region: Secondary | ICD-10-CM | POA: Diagnosis not present

## 2022-03-19 NOTE — Progress Notes (Signed)
? ?Michael [name] - 56 y.o. male MRN [id number]  Date of birth: [birth year] ? ?Office Visit Note: ?Visit Date: 03/19/2022 ?PCP: Michael Salter, MD ?Referred by: Michael Salter, MD ? ?Subjective: ?Chief Complaint  ?Patient presents with  ? Lower Back - Pain  ? Right Leg - Pain  ? Right Foot - Pain  ? ?HPI: Michael [name] is a 56 y.o. male who comes in today at the request of Michael [name] for evaluation of chronic, worsening and severe bilateral lower back pain radiating to right buttock and down lateral leg to foot. Patient reports pain has been ongoing for several years and is exacerbated by laying and sitting. He describes pain as constant sore and aching sensation, currently rates as 8 out of 10. He reports some relief of pain with home exercise regimen, rest and use of medications. Patient reports history of formal physical therapy at [facility] in the past with some relief of pain. Patient currently taking Norco that is prescribed by Dr. Vira [name] at [facility].  Patients recent lumbar MRI exhibits prior decompression and fusion at L4-L5 and L5-S1 and moderate bilateral facet arthropathy and ligamentum flavum buckling at L3-L4. No high grade spinal canal stenosis. Patient reports history of decompression and fusion at L4-L5 and L5-S1 performed by Michael [name] in 2019. Patient has history of bilateral L5 transforaminal epidural steroid injection performed in our office in 2021, reports some pain relief with this procedure. Patient states his severe pain is negatively impacting daily life and is making it difficult to complete tasks. Patient denies focal weakness, numbness and tingling. Patient denies recent trauma or falls.  ? ?Patients course is complicated by COPD, diabetes mellitus, alcoholic polyneuropathy, alcoholic cirrhosis and hepatic encephalopathy. ? ? ?Oswestry Disability Index Score 68%: 30 to 40 (80%): very severe disability: Back pain impinges on  all aspects of the patient's life. Positive intervention is required. ? ?Review of Systems  ?Musculoskeletal:  Positive for back pain.  ?Neurological:  Negative for tingling, sensory change, focal weakness and weakness.  ?All other systems reviewed and are negative. Otherwise per HPI. ? ?Assessment & Plan: ?Visit Diagnoses:  ?  ICD-10-CM   ?1. Lumbar radiculopathy  M54.16   ?  ?2. Other spondylosis with radiculopathy, lumbar region  M47.26   ?  ?3. Facet arthropathy, lumbar  M47.816   ?  ?4. S/P lumbar fusion  Z98.1   ?  ?   ?Plan: Findings:  ?Chronic, worsening and severe bilateral lower back pain radiating to right buttock and down right lateral leg to foot. Patient continues to have severe pain despite good conservative therapy such as formal physical therapy, home exercise regimen, rest and use of medications.  Patient's clinical presentation and exam are consistent with L5 nerve pattern.  We believe the next step is to perform a diagnostic and hopefully therapeutic right L5 transforaminal epidural steroid injection under fluoroscopic guidance. Patient is agreeable with plan and has no questions about lumbar injection procedure.  Patient encouraged to remain active and to continue home exercise regimen as tolerated. We feel that we can get patient in quickly for injection.  No red flag symptoms noted upon exam today.  ? ?Meds & Orders: No orders of the defined types were placed in this encounter. ? No orders of the defined types were placed in this encounter. ?  ?Follow-up: Return for Right L5 transforaminal epidural steroid injection.  ? ?Procedures: ?No procedures performed  ?   ? ?  Clinical History: ?MRI LUMBAR SPINE WITHOUT AND WITH CONTRAST ?  ?TECHNIQUE: ?Multiplanar and multiecho pulse sequences of the lumbar spine were ?obtained without and with intravenous contrast. ?  ?CONTRAST:  29mL MULTIHANCE GADOBENATE DIMEGLUMINE 529 MG/ML IV SOLN ?  ?COMPARISON:  MRI 01/07/2020 ?  ?FINDINGS: ?Segmentation:   Standard. ?  ?Alignment:  Physiologic. ?  ?Vertebrae: No fracture, evidence of discitis, or bone lesion. Prior ?posterior and interbody fusion at L4-5 and L5-S1. ?  ?Conus medullaris and cauda equina: Conus extends to the L1 level. ?Conus and cauda equina appear normal. ?  ?Paraspinal and other soft tissues: No acute findings. Scattered ?cutaneous and subcutaneous cysts, unchanged. ?  ?Disc levels: ?  ?T12-L1: No disc protrusion. Mild bilateral facet arthropathy. No ?foraminal or canal stenosis. Unchanged. ?  ?L1-L2: No disc protrusion. Mild bilateral facet arthropathy. No ?foraminal or canal stenosis. Unchanged. ?  ?L2-L3: No disc protrusion. Mild bilateral facet arthropathy. No ?foraminal or canal stenosis. Unchanged. ?  ?L3-L4: Minimal annular disc bulge. Moderate bilateral facet ?arthropathy and ligamentum flavum buckling. Borderline-mild right ?foraminal stenosis, slightly progressed. No canal stenosis. ?  ?L4-L5: Prior decompression and fusion. Canal and foramina remain ?widely patent. ?  ?L5-S1: Prior decompression and fusion. Canal and foramina remain ?widely patent. ?  ?IMPRESSION: ?1. Prior posterior and interbody fusion at L4-5 and L5-S1 without ?residual stenosis. ?2. Slight progression of degenerative changes at L3-L4 with ?borderline-mild right foraminal stenosis. ?3. No canal stenosis at any level. ?  ?  ?Electronically Signed ?  By: Michael [name] D.O. ?  On: 02/28/2022 13:08  ? ?He reports that he has been smoking cigarettes. He has a 32.00 pack-year smoking history. He has never used smokeless tobacco.  ?Recent Labs  ?  12/30/21 ?[id number]  ?HGBA1C 5.2  ? ? ?Objective:  VS:  HT:    WT:   BMI:     BP:105/68  HR:80bpm  TEMP: ( )  RESP:  ?Physical Exam ?Vitals and nursing note reviewed.  ?HENT:  ?   Head: Normocephalic and atraumatic.  ?   Right Ear: External ear normal.  ?   Left Ear: External ear normal.  ?   Nose: Nose normal.  ?   Mouth/Throat:  ?   Mouth: Mucous membranes are moist.  ?Eyes:  ?    Extraocular Movements: Extraocular movements intact.  ?Cardiovascular:  ?   Rate and Rhythm: Normal rate.  ?   Pulses: Normal pulses.  ?Pulmonary:  ?   Effort: Pulmonary effort is normal.  ?Abdominal:  ?   General: Abdomen is flat. There is no distension.  ?Musculoskeletal:     ?   General: Tenderness present.  ?   Cervical back: Normal range of motion.  ?   Comments: Pt rises from seated position to standing without difficulty. Good lumbar range of motion. Strong distal strength without clonus, no pain upon palpation of greater trochanters. Dysesthesias noted to right L5 dermatome. Sensation intact bilaterally. Walks independently, gait steady.   ?Skin: ?   General: Skin is warm and dry.  ?   Capillary Refill: Capillary refill takes less than 2 seconds.  ?Neurological:  ?   General: No focal deficit present.  ?   Mental Status: He is alert and oriented to person, place, and time.  ?Psychiatric:     ?   Mood and Affect: Mood normal.     ?   Behavior: Behavior normal.  ?  ?Ortho Exam ? ?Imaging: ?No results found. ? ?Past Medical/Family/Surgical/Social History: ?Medications &  Allergies reviewed per EMR, new medications updated. ?Patient Active Problem List  ? Diagnosis Date Noted  ? Hepatic encephalopathy ([place]) 12/29/2021  ? COPD (chronic obstructive pulmonary disease) ([place]) 12/29/2021  ? Lactic acidosis 12/29/2021  ? Cubital tunnel syndrome on right 06/08/2019  ?  Class: Chronic  ? Alcoholic cirrhosis ([place])   ? Cough   ? Non-insulin dependent type 2 diabetes mellitus ([place])   ? Tobacco use disorder   ? History of alcohol abuse   ? Essential hypertension   ? Anxiety state   ? Increased ammonia level   ? DKA (diabetic ketoacidoses) 03/07/2019  ? Alcoholic polyneuropathy ([place]) 01/13/2019  ? Thrombocytopenia ([place]) 07/24/2018  ? Hyperglycemia 07/24/2018  ? Anemia due to acute blood loss 07/23/2018  ?  Class: Acute  ? Degenerative disc disease, lumbar 07/22/2018  ?  Class: Chronic  ? Status post lumbar spinal fusion  07/22/2018  ? Anemia associated with acute blood loss 04/24/2017  ?  Class: Acute  ? Spinal stenosis of lumbar region 04/23/2017  ? Upper GI bleed 01/22/2017  ? Acute blood loss anemia 01/22/2017  ? Hematemesis 02/27

## 2022-03-19 NOTE — Progress Notes (Signed)
Pt state lower back pain that travels down his right leg and foot. Pt state walking, standing and laying down makes the pain worse. Pt state the pain wakes him up at night even after sleep meds taking.Pt state he takes pain meds and pain cream to help ease his pain. ? ?Numeric Pain Rating Scale and Functional Assessment ?Average Pain 10 ?Pain Right Now 5 ?My pain is constant, sharp, burning, dull, stabbing, tingling, and aching ?Pain is worse with: walking, bending, sitting, standing, some activites, and laying down ?Pain improves with: rest, medication, and injections ? ? ?In the last MONTH (on 0-10 scale) has pain interfered with the following? ? ?1. General activity like being  able to carry out your everyday physical activities such as walking, climbing stairs, carrying groceries, or moving a chair?  ?Rating(5) ? ?2. Relation with others like being able to carry out your usual social activities and roles such as  activities at home, at work and in your community. ?Rating(6) ? ?3. Enjoyment of life such that you have  been bothered by emotional problems such as feeling anxious, depressed or irritable?  ?Rating(7) ? ?

## 2022-03-22 ENCOUNTER — Ambulatory Visit (INDEPENDENT_AMBULATORY_CARE_PROVIDER_SITE_OTHER): Payer: BC Managed Care – PPO | Admitting: Surgery

## 2022-03-22 ENCOUNTER — Ambulatory Visit: Payer: Self-pay

## 2022-03-22 ENCOUNTER — Encounter: Payer: Self-pay | Admitting: Surgery

## 2022-03-22 VITALS — BP 105/71 | HR 80 | Ht 70.0 in | Wt 200.0 lb

## 2022-03-22 DIAGNOSIS — M79671 Pain in right foot: Secondary | ICD-10-CM | POA: Diagnosis not present

## 2022-03-22 DIAGNOSIS — S92334A Nondisplaced fracture of third metatarsal bone, right foot, initial encounter for closed fracture: Secondary | ICD-10-CM | POA: Diagnosis not present

## 2022-03-22 DIAGNOSIS — S92344A Nondisplaced fracture of fourth metatarsal bone, right foot, initial encounter for closed fracture: Secondary | ICD-10-CM
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
## 2022-03-23 ENCOUNTER — Telehealth: Payer: Self-pay | Admitting: Physical Medicine and Rehabilitation

## 2022-03-23 NOTE — Telephone Encounter (Signed)
Pt returned call to [facility] about an appt. Pt states he was out of it and dont remember the conversation. Please call pt at [phone] ?

## 2022-03-28 ENCOUNTER — Ambulatory Visit (INDEPENDENT_AMBULATORY_CARE_PROVIDER_SITE_OTHER): Payer: BC Managed Care – PPO | Admitting: Orthopaedic Surgery

## 2022-03-28 ENCOUNTER — Encounter: Payer: Self-pay | Admitting: Orthopaedic Surgery

## 2022-03-28 ENCOUNTER — Ambulatory Visit: Payer: BC Managed Care – PPO

## 2022-03-28 VITALS — BP 117/75 | HR 80 | Ht 71.0 in | Wt 205.0 lb

## 2022-03-28 DIAGNOSIS — M79671 Pain in right foot: Secondary | ICD-10-CM
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
## 2022-04-11 ENCOUNTER — Ambulatory Visit: Payer: BC Managed Care – PPO | Admitting: Specialist

## 2022-04-19 ENCOUNTER — Encounter: Payer: Self-pay | Admitting: Orthopaedic Surgery

## 2022-04-25 ENCOUNTER — Ambulatory Visit (INDEPENDENT_AMBULATORY_CARE_PROVIDER_SITE_OTHER): Payer: BC Managed Care – PPO | Admitting: Orthopaedic Surgery

## 2022-04-25 ENCOUNTER — Ambulatory Visit: Payer: Self-pay

## 2022-04-25 ENCOUNTER — Ambulatory Visit (INDEPENDENT_AMBULATORY_CARE_PROVIDER_SITE_OTHER): Payer: BC Managed Care – PPO

## 2022-04-25 DIAGNOSIS — M79672 Pain in left foot: Secondary | ICD-10-CM

## 2022-04-25 DIAGNOSIS — M79671 Pain in right foot: Secondary | ICD-10-CM

## 2022-04-25 DIAGNOSIS — S93492A Sprain of other ligament of left ankle, initial encounter: Secondary | ICD-10-CM | POA: Diagnosis not present

## 2022-04-25 DIAGNOSIS — S93402A Sprain of unspecified ligament of left ankle, initial encounter: Secondary | ICD-10-CM | POA: Insufficient documentation
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
## 2022-04-26 ENCOUNTER — Ambulatory Visit: Payer: Self-pay

## 2022-04-26 ENCOUNTER — Encounter: Payer: Self-pay | Admitting: Physical Medicine and Rehabilitation

## 2022-04-26 ENCOUNTER — Ambulatory Visit (INDEPENDENT_AMBULATORY_CARE_PROVIDER_SITE_OTHER): Payer: BC Managed Care – PPO | Admitting: Physical Medicine and Rehabilitation

## 2022-04-26 VITALS — BP 117/75 | HR 77

## 2022-04-26 DIAGNOSIS — G894 Chronic pain syndrome: Secondary | ICD-10-CM

## 2022-04-26 DIAGNOSIS — M961 Postlaminectomy syndrome, not elsewhere classified: Secondary | ICD-10-CM

## 2022-04-26 DIAGNOSIS — M5416 Radiculopathy, lumbar region: Secondary | ICD-10-CM | POA: Diagnosis not present

## 2022-04-26 MED ORDER — METHYLPREDNISOLONE ACETATE 80 MG/ML IJ SUSP
80.0000 mg | Freq: Once | INTRAMUSCULAR | Status: AC
  Administered 2022-04-26: 80 mg

## 2022-04-26 NOTE — Progress Notes (Signed)
Numeric Pain Rating Scale and Functional Assessment Average Pain 8   In the last MONTH (on 0-10 scale) has pain interfered with the following?  1. General activity like being  able to carry out your everyday physical activities such as walking, climbing stairs, carrying groceries, or moving a chair?  Rating(8)   +Driver, -BT, -Dye Allergies.    Pain in lower back down right leg.

## 2022-04-30 ENCOUNTER — Encounter: Payer: Self-pay | Admitting: Specialist

## 2022-05-04 ENCOUNTER — Encounter: Payer: Self-pay | Admitting: Specialist

## 2022-05-15 NOTE — Progress Notes (Signed)
Michael [name] - 56 y.o. male MRN [id number]  Date of birth: 07/27/1966  Office Visit Note: Visit Date: 04/26/2022 PCP: Ermalinda Memos, MD Referred by: Ermalinda Memos, MD  Subjective: Chief Complaint  Patient presents with   Lower Back - Pain   Right Hip - Pain   Right Leg - Pain, Numbness   HPI:  Michael [name] is a 56 y.o. male who comes in today at the request of Dr. Vira Browns for planned Right L5-S1 Lumbar Transforaminal epidural steroid injection with fluoroscopic guidance.  The patient has failed conservative care including home exercise, medications, time and activity modification.  This injection will be diagnostic and hopefully therapeutic.  Please see requesting physician notes for further details and justification.   ROS Otherwise per HPI.  Assessment & Plan: Visit Diagnoses:    ICD-10-CM   1. Lumbar radiculopathy  M54.16 XR C-ARM NO REPORT    Epidural Steroid injection    methylPREDNISolone acetate (DEPO-MEDROL) injection 80 mg    2. Post laminectomy syndrome  M96.1     3. Chronic pain syndrome  G89.4       Plan: No additional findings.   Meds & Orders:  Meds ordered this encounter  Medications   methylPREDNISolone acetate (DEPO-MEDROL) injection 80 mg    Orders Placed This Encounter  Procedures   XR C-ARM NO REPORT   Epidural Steroid injection    Follow-up: Return for visit to requesting provider as needed.   Procedures: No procedures performed  Lumbosacral Transforaminal Epidural Steroid Injection - Sub-Pedicular Approach with Fluoroscopic Guidance  Patient: Michael [name]      Date of Birth: September 13, 1966 MRN: [id number] PCP: Ermalinda Memos, MD      Visit Date: 04/26/2022   Universal Protocol:    Date/Time: 04/26/2022  Consent Given By: the patient  Position: PRONE  Additional Comments: Vital signs were monitored before and after the procedure. Patient was prepped and draped in the usual sterile fashion. The  correct patient, procedure, and site was verified.   Injection Procedure Details:   Procedure diagnoses: Lumbar radiculopathy [M54.16]    Meds Administered:  Meds ordered this encounter  Medications   methylPREDNISolone acetate (DEPO-MEDROL) injection 80 mg    Laterality: Right  Location/Site: L5  Needle:5.0 in., 22 ga.  Short bevel or Quincke spinal needle  Needle Placement: Transforaminal  Findings:    -Comments: Excellent flow of contrast along the nerve, nerve root and into the epidural space.  Procedure Details: After squaring off the end-plates to get a true AP view, the C-arm was positioned so that an oblique view of the foramen as noted above was visualized. The target area is just inferior to the "nose of the scotty dog" or sub pedicular. The soft tissues overlying this structure were infiltrated with 2-3 ml. of 1% Lidocaine without Epinephrine.  The spinal needle was inserted toward the target using a "trajectory" view along the fluoroscope beam.  Under AP and lateral visualization, the needle was advanced so it did not puncture dura and was located close the 6 O'Clock position of the pedical in AP tracterory. Biplanar projections were used to confirm position. Aspiration was confirmed to be negative for CSF and/or blood. A 1-2 ml. volume of Isovue-250 was injected and flow of contrast was noted at each level. Radiographs were obtained for documentation purposes.   After attaining the desired flow of contrast documented above, a 0.5 to 1.0 ml test dose of 0.25% Marcaine was injected into each  respective transforaminal space.  The patient was observed for 90 seconds post injection.  After no sensory deficits were reported, and normal lower extremity motor function was noted,   the above injectate was administered so that equal amounts of the injectate were placed at each foramen (level) into the transforaminal epidural space.   Additional Comments:  The patient tolerated  the procedure well Dressing: 2 x 2 sterile gauze and Band-Aid    Post-procedure details: Patient was observed during the procedure. Post-procedure instructions were reviewed.  Patient left the clinic in stable condition.    Clinical History: MRI LUMBAR SPINE WITHOUT AND WITH CONTRAST   TECHNIQUE: Multiplanar and multiecho pulse sequences of the lumbar spine were obtained without and with intravenous contrast.   CONTRAST:  28mL MULTIHANCE GADOBENATE DIMEGLUMINE 529 MG/ML IV SOLN   COMPARISON:  MRI 01/07/2020   FINDINGS: Segmentation:  Standard.   Alignment:  Physiologic.   Vertebrae: No fracture, evidence of discitis, or bone lesion. Prior posterior and interbody fusion at L4-5 and L5-S1.   Conus medullaris and cauda equina: Conus extends to the L1 level. Conus and cauda equina appear normal.   Paraspinal and other soft tissues: No acute findings. Scattered cutaneous and subcutaneous cysts, unchanged.   Disc levels:   T12-L1: No disc protrusion. Mild bilateral facet arthropathy. No foraminal or canal stenosis. Unchanged.   L1-L2: No disc protrusion. Mild bilateral facet arthropathy. No foraminal or canal stenosis. Unchanged.   L2-L3: No disc protrusion. Mild bilateral facet arthropathy. No foraminal or canal stenosis. Unchanged.   L3-L4: Minimal annular disc bulge. Moderate bilateral facet arthropathy and ligamentum flavum buckling. Borderline-mild right foraminal stenosis, slightly progressed. No canal stenosis.   L4-L5: Prior decompression and fusion. Canal and foramina remain widely patent.   L5-S1: Prior decompression and fusion. Canal and foramina remain widely patent.   IMPRESSION: 1. Prior posterior and interbody fusion at L4-5 and L5-S1 without residual stenosis. 2. Slight progression of degenerative changes at L3-L4 with borderline-mild right foraminal stenosis. 3. No canal stenosis at any level.     Electronically Signed   By: Duanne Guess D.O.   On: 02/28/2022 13:08     Objective:  VS:  HT:    WT:   BMI:     BP:117/75  HR:77bpm  TEMP: ( )  RESP:  Physical Exam Vitals and nursing note reviewed.  Constitutional:      General: He is not in acute distress.    Appearance: Normal appearance. He is not ill-appearing.  HENT:     Head: Normocephalic and atraumatic.     Right Ear: External ear normal.     Left Ear: External ear normal.     Nose: No congestion.  Eyes:     Extraocular Movements: Extraocular movements intact.  Cardiovascular:     Rate and Rhythm: Normal rate.     Pulses: Normal pulses.  Pulmonary:     Effort: Pulmonary effort is normal. No respiratory distress.  Abdominal:     General: There is no distension.     Palpations: Abdomen is soft.  Musculoskeletal:        General: No tenderness or signs of injury.     Cervical back: Neck supple.     Right lower leg: No edema.     Left lower leg: No edema.     Comments: Patient has good distal strength without clonus.  Skin:    Findings: No erythema or rash.  Neurological:     General: No focal deficit  present.     Mental Status: He is alert and oriented to person, place, and time.     Sensory: No sensory deficit.     Motor: No weakness or abnormal muscle tone.     Coordination: Coordination normal.  Psychiatric:        Mood and Affect: Mood normal.        Behavior: Behavior normal.      Imaging: No results found.

## 2022-05-15 NOTE — Procedures (Signed)
Lumbosacral Transforaminal Epidural Steroid Injection - Sub-Pedicular Approach with Fluoroscopic Guidance  Patient: Michael [name]      Date of Birth: September 24, 1966 MRN: [id number] PCP: Ermalinda Memos, MD      Visit Date: 04/26/2022   Universal Protocol:    Date/Time: 04/26/2022  Consent Given By: the patient  Position: PRONE  Additional Comments: Vital signs were monitored before and after the procedure. Patient was prepped and draped in the usual sterile fashion. The correct patient, procedure, and site was verified.   Injection Procedure Details:   Procedure diagnoses: Lumbar radiculopathy [M54.16]    Meds Administered:  Meds ordered this encounter  Medications   methylPREDNISolone acetate (DEPO-MEDROL) injection 80 mg    Laterality: Right  Location/Site: L5  Needle:5.0 in., 22 ga.  Short bevel or Quincke spinal needle  Needle Placement: Transforaminal  Findings:    -Comments: Excellent flow of contrast along the nerve, nerve root and into the epidural space.  Procedure Details: After squaring off the end-plates to get a true AP view, the C-arm was positioned so that an oblique view of the foramen as noted above was visualized. The target area is just inferior to the "nose of the scotty dog" or sub pedicular. The soft tissues overlying this structure were infiltrated with 2-3 ml. of 1% Lidocaine without Epinephrine.  The spinal needle was inserted toward the target using a "trajectory" view along the fluoroscope beam.  Under AP and lateral visualization, the needle was advanced so it did not puncture dura and was located close the 6 O'Clock position of the pedical in AP tracterory. Biplanar projections were used to confirm position. Aspiration was confirmed to be negative for CSF and/or blood. A 1-2 ml. volume of Isovue-250 was injected and flow of contrast was noted at each level. Radiographs were obtained for documentation purposes.   After attaining the  desired flow of contrast documented above, a 0.5 to 1.0 ml test dose of 0.25% Marcaine was injected into each respective transforaminal space.  The patient was observed for 90 seconds post injection.  After no sensory deficits were reported, and normal lower extremity motor function was noted,   the above injectate was administered so that equal amounts of the injectate were placed at each foramen (level) into the transforaminal epidural space.   Additional Comments:  The patient tolerated the procedure well Dressing: 2 x 2 sterile gauze and Band-Aid    Post-procedure details: Patient was observed during the procedure. Post-procedure instructions were reviewed.  Patient left the clinic in stable condition.

## 2022-05-25 ENCOUNTER — Ambulatory Visit (INDEPENDENT_AMBULATORY_CARE_PROVIDER_SITE_OTHER): Payer: BC Managed Care – PPO | Admitting: Specialist

## 2022-05-25 ENCOUNTER — Encounter: Payer: Self-pay | Admitting: Specialist

## 2022-05-25 VITALS — BP 101/66 | HR 69 | Ht 71.0 in | Wt 205.0 lb

## 2022-05-25 DIAGNOSIS — Z794 Long term (current) use of insulin: Secondary | ICD-10-CM

## 2022-05-25 DIAGNOSIS — Z981 Arthrodesis status: Secondary | ICD-10-CM

## 2022-05-25 DIAGNOSIS — G894 Chronic pain syndrome: Secondary | ICD-10-CM | POA: Diagnosis not present

## 2022-05-25 DIAGNOSIS — M79671 Pain in right foot: Secondary | ICD-10-CM

## 2022-05-25 DIAGNOSIS — M79672 Pain in left foot: Secondary | ICD-10-CM

## 2022-05-25 DIAGNOSIS — M4726 Other spondylosis with radiculopathy, lumbar region: Secondary | ICD-10-CM

## 2022-05-25 DIAGNOSIS — R278 Other lack of coordination: Secondary | ICD-10-CM

## 2022-05-25 DIAGNOSIS — E119 Type 2 diabetes mellitus without complications: Secondary | ICD-10-CM

## 2022-05-25 DIAGNOSIS — M961 Postlaminectomy syndrome, not elsewhere classified: Secondary | ICD-10-CM | POA: Diagnosis not present

## 2022-05-25 DIAGNOSIS — R251 Tremor, unspecified: Secondary | ICD-10-CM
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
## 2022-05-25 NOTE — Patient Instructions (Addendum)
  Plan: Avoid bending, stooping and avoid lifting weights greater than 10 lbs. Avoid prolong standing and walking. Avoid frequent bending and stooping  No lifting greater than 10 lbs. May use ice or moist heat for pain. Weight loss is of benefit. Handicap license is approved. Call if pain is recurring and we would have Dr. [place] Blas secretary/Assistant call to arrange for epidural steroid injection   Vitamin B complex tablet. Ask to be referred to a nutrition specialist concerning a hepatic diet to decrease amonia production.

## 2022-05-26 ENCOUNTER — Other Ambulatory Visit: Payer: Self-pay | Admitting: Specialist

## 2022-06-18 ENCOUNTER — Ambulatory Visit (INDEPENDENT_AMBULATORY_CARE_PROVIDER_SITE_OTHER): Payer: BC Managed Care – PPO | Admitting: Orthopedic Surgery

## 2022-06-18 DIAGNOSIS — E1142 Type 2 diabetes mellitus with diabetic polyneuropathy: Secondary | ICD-10-CM | POA: Diagnosis not present

## 2022-06-18 DIAGNOSIS — M6702 Short Achilles tendon (acquired), left ankle: Secondary | ICD-10-CM | POA: Diagnosis not present

## 2022-06-18 DIAGNOSIS — M6701 Short Achilles tendon (acquired), right ankle: Secondary | ICD-10-CM | POA: Diagnosis not present

## 2022-06-19 ENCOUNTER — Encounter: Payer: Self-pay | Admitting: Orthopedic Surgery
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
## 2022-07-06 ENCOUNTER — Ambulatory Visit (INDEPENDENT_AMBULATORY_CARE_PROVIDER_SITE_OTHER): Payer: BC Managed Care – PPO | Admitting: Specialist

## 2022-07-06 ENCOUNTER — Encounter: Payer: Self-pay | Admitting: Specialist

## 2022-07-06 VITALS — BP 109/72 | HR 76 | Ht 71.25 in | Wt 207.5 lb

## 2022-07-06 DIAGNOSIS — M961 Postlaminectomy syndrome, not elsewhere classified: Secondary | ICD-10-CM | POA: Diagnosis not present

## 2022-07-06 DIAGNOSIS — Z981 Arthrodesis status: Secondary | ICD-10-CM

## 2022-07-06 DIAGNOSIS — M4726 Other spondylosis with radiculopathy, lumbar region: Secondary | ICD-10-CM

## 2022-07-06 NOTE — Patient Instructions (Signed)
Plan: Avoid bending, stooping and avoid lifting weights greater than 10 lbs. Avoid prolong standing and walking. Avoid frequent bending and stooping  No lifting greater than 10 lbs. May use ice or moist heat for pain. Weight loss is of benefit. Handicap license is approved. Dr. Reydon Blas secretary/Assistant will call to arrange for epidural steroid injection right L3-4 with a right L3-4 .
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
## 2022-07-12 ENCOUNTER — Telehealth: Payer: Self-pay | Admitting: Physical Medicine and Rehabilitation

## 2022-07-12 ENCOUNTER — Telehealth: Payer: Self-pay | Admitting: Specialist

## 2022-07-12 NOTE — Telephone Encounter (Signed)
Received call from patient needing a new Rx for Orthopedic inserts for his shoes. The number to contact patient is [phone]

## 2022-07-12 NOTE — Telephone Encounter (Signed)
Pt called to set an appt. PLEASE CALL PT AT [phone]

## 2022-07-12 NOTE — Telephone Encounter (Signed)
I called and advised that this will have to wait for Dr. Otelia Sergeant returns.

## 2022-07-27 NOTE — Telephone Encounter (Signed)
Order written and mailed to patient.

## 2022-08-01 ENCOUNTER — Ambulatory Visit: Payer: Self-pay

## 2022-08-01 ENCOUNTER — Encounter: Payer: Self-pay | Admitting: Physical Medicine and Rehabilitation

## 2022-08-01 ENCOUNTER — Ambulatory Visit (INDEPENDENT_AMBULATORY_CARE_PROVIDER_SITE_OTHER): Payer: BC Managed Care – PPO | Admitting: Physical Medicine and Rehabilitation

## 2022-08-01 VITALS — BP 118/73 | HR 74

## 2022-08-01 DIAGNOSIS — M5416 Radiculopathy, lumbar region: Secondary | ICD-10-CM

## 2022-08-01 DIAGNOSIS — M961 Postlaminectomy syndrome, not elsewhere classified: Secondary | ICD-10-CM | POA: Diagnosis not present

## 2022-08-01 DIAGNOSIS — Z981 Arthrodesis status: Secondary | ICD-10-CM | POA: Diagnosis not present

## 2022-08-01 MED ORDER — METHYLPREDNISOLONE ACETATE 80 MG/ML IJ SUSP
40.0000 mg | Freq: Once | INTRAMUSCULAR | Status: AC
  Administered 2022-08-01: 40 mg

## 2022-08-01 NOTE — Progress Notes (Signed)
Pt state lower back pain that travels down both legs and feet, mostly right. Pt state walking, standing and bending makes the pain worse. .Pt state he takes pain meds and pain cream to help ease his pain.  Numeric Pain Rating Scale and Functional Assessment Average Pain 6   In the last MONTH (on 0-10 scale) has pain interfered with the following?  1. General activity like being  able to carry out your everyday physical activities such as walking, climbing stairs, carrying groceries, or moving a chair?  Rating(9)   +Driver, -BT, -Dye Allergies.

## 2022-08-01 NOTE — Procedures (Signed)
Lumbar Epidural Steroid Injection - Interlaminar Approach with Fluoroscopic Guidance  Patient: Michael [name]      Date of Birth: [birth year] MRN: [id number] PCP: Ermalinda Memos, MD      Visit Date: 08/01/2022   Universal Protocol:     Consent Given By: the patient  Position: PRONE  Additional Comments: Vital signs were monitored before and after the procedure. Patient was prepped and draped in the usual sterile fashion. The correct patient, procedure, and site was verified.   Injection Procedure Details:   Procedure diagnoses: Lumbar radiculopathy [M54.16]   Meds Administered:  Meds ordered this encounter  Medications   methylPREDNISolone acetate (DEPO-MEDROL) injection 40 mg     Laterality: Right  Location/Site:  L3-4  Needle: 3.5 in., 20 ga. Tuohy  Needle Placement: Paramedian epidural  Findings:   -Comments: Excellent flow of contrast into the epidural space.  Procedure Details: Using a paramedian approach from the side mentioned above, the region overlying the inferior lamina was localized under fluoroscopic visualization and the soft tissues overlying this structure were infiltrated with 4 ml. of 1% Lidocaine without Epinephrine. The Tuohy needle was inserted into the epidural space using a paramedian approach.   The epidural space was localized using loss of resistance along with counter oblique bi-planar fluoroscopic views.  After negative aspirate for air, blood, and CSF, a 2 ml. volume of Isovue-250 was injected into the epidural space and the flow of contrast was observed. Radiographs were obtained for documentation purposes.    The injectate was administered into the level noted above.   Additional Comments:  The patient tolerated the procedure well Dressing: 2 x 2 sterile gauze and Band-Aid    Post-procedure details: Patient was observed during the procedure. Post-procedure instructions were reviewed.  Patient left the clinic in stable  condition.

## 2022-08-01 NOTE — Progress Notes (Signed)
Michael [name] - 56 y.o. male MRN [id number]  Date of birth: 01/25/1966  Office Visit Note: Visit Date: 08/01/2022 PCP: Ermalinda Memos, MD Referred by: Ermalinda Memos, MD  Subjective: Chief Complaint  Patient presents with   Lower Back - Pain   Right Leg - Pain   Left Leg - Pain   Left Foot - Pain   Right Foot - Pain   HPI:  Michael [name] is a 56 y.o. male who comes in today at the request of Dr. Vira Browns for planned Right L3-4 Lumbar Interlaminar epidural steroid injection with fluoroscopic guidance.  The patient has failed conservative care including home exercise, medications, time and activity modification.  This injection will be diagnostic and hopefully therapeutic.  Please see requesting physician notes for further details and justification.   ROS Otherwise per HPI.  Assessment & Plan: Visit Diagnoses:    ICD-10-CM   1. Lumbar radiculopathy  M54.16 XR C-ARM NO REPORT    Epidural Steroid injection    methylPREDNISolone acetate (DEPO-MEDROL) injection 40 mg    2. S/P lumbar fusion  Z98.1 XR C-ARM NO REPORT    Epidural Steroid injection    methylPREDNISolone acetate (DEPO-MEDROL) injection 40 mg    3. Post laminectomy syndrome  M96.1 XR C-ARM NO REPORT    Epidural Steroid injection    methylPREDNISolone acetate (DEPO-MEDROL) injection 40 mg      Plan: No additional findings.   Meds & Orders:  Meds ordered this encounter  Medications   methylPREDNISolone acetate (DEPO-MEDROL) injection 40 mg    Orders Placed This Encounter  Procedures   XR C-ARM NO REPORT   Epidural Steroid injection    Follow-up: Return for visit to requesting provider as needed.   Procedures: No procedures performed  Lumbar Epidural Steroid Injection - Interlaminar Approach with Fluoroscopic Guidance  Patient: Michael [name]      Date of Birth: 14-Jun-1966 MRN: [id number] PCP: Ermalinda Memos, MD      Visit Date: 08/01/2022   Universal Protocol:      Consent Given By: the patient  Position: PRONE  Additional Comments: Vital signs were monitored before and after the procedure. Patient was prepped and draped in the usual sterile fashion. The correct patient, procedure, and site was verified.   Injection Procedure Details:   Procedure diagnoses: Lumbar radiculopathy [M54.16]   Meds Administered:  Meds ordered this encounter  Medications   methylPREDNISolone acetate (DEPO-MEDROL) injection 40 mg     Laterality: Right  Location/Site:  L3-4  Needle: 3.5 in., 20 ga. Tuohy  Needle Placement: Paramedian epidural  Findings:   -Comments: Excellent flow of contrast into the epidural space.  Procedure Details: Using a paramedian approach from the side mentioned above, the region overlying the inferior lamina was localized under fluoroscopic visualization and the soft tissues overlying this structure were infiltrated with 4 ml. of 1% Lidocaine without Epinephrine. The Tuohy needle was inserted into the epidural space using a paramedian approach.   The epidural space was localized using loss of resistance along with counter oblique bi-planar fluoroscopic views.  After negative aspirate for air, blood, and CSF, a 2 ml. volume of Isovue-250 was injected into the epidural space and the flow of contrast was observed. Radiographs were obtained for documentation purposes.    The injectate was administered into the level noted above.   Additional Comments:  The patient tolerated the procedure well Dressing: 2 x 2 sterile gauze and Band-Aid    Post-procedure details:  Patient was observed during the procedure. Post-procedure instructions were reviewed.  Patient left the clinic in stable condition.   Clinical History: MRI LUMBAR SPINE WITHOUT AND WITH CONTRAST   TECHNIQUE: Multiplanar and multiecho pulse sequences of the lumbar spine were obtained without and with intravenous contrast.   CONTRAST:  7mL MULTIHANCE GADOBENATE  DIMEGLUMINE 529 MG/ML IV SOLN   COMPARISON:  MRI 01/07/2020   FINDINGS: Segmentation:  Standard.   Alignment:  Physiologic.   Vertebrae: No fracture, evidence of discitis, or bone lesion. Prior posterior and interbody fusion at L4-5 and L5-S1.   Conus medullaris and cauda equina: Conus extends to the L1 level. Conus and cauda equina appear normal.   Paraspinal and other soft tissues: No acute findings. Scattered cutaneous and subcutaneous cysts, unchanged.   Disc levels:   T12-L1: No disc protrusion. Mild bilateral facet arthropathy. No foraminal or canal stenosis. Unchanged.   L1-L2: No disc protrusion. Mild bilateral facet arthropathy. No foraminal or canal stenosis. Unchanged.   L2-L3: No disc protrusion. Mild bilateral facet arthropathy. No foraminal or canal stenosis. Unchanged.   L3-L4: Minimal annular disc bulge. Moderate bilateral facet arthropathy and ligamentum flavum buckling. Borderline-mild right foraminal stenosis, slightly progressed. No canal stenosis.   L4-L5: Prior decompression and fusion. Canal and foramina remain widely patent.   L5-S1: Prior decompression and fusion. Canal and foramina remain widely patent.   IMPRESSION: 1. Prior posterior and interbody fusion at L4-5 and L5-S1 without residual stenosis. 2. Slight progression of degenerative changes at L3-L4 with borderline-mild right foraminal stenosis. 3. No canal stenosis at any level.     Electronically Signed   By: Duanne Guess D.O.   On: 02/28/2022 13:08     Objective:  VS:  HT:    WT:   BMI:     BP:118/73  HR:74bpm  TEMP: ( )  RESP:  Physical Exam Vitals and nursing note reviewed.  Constitutional:      General: He is not in acute distress.    Appearance: Normal appearance. He is not ill-appearing.  HENT:     Head: Normocephalic and atraumatic.     Right Ear: External ear normal.     Left Ear: External ear normal.     Nose: No congestion.  Eyes:     Extraocular  Movements: Extraocular movements intact.  Cardiovascular:     Rate and Rhythm: Normal rate.     Pulses: Normal pulses.  Pulmonary:     Effort: Pulmonary effort is normal. No respiratory distress.  Abdominal:     General: There is no distension.     Palpations: Abdomen is soft.  Musculoskeletal:        General: No tenderness or signs of injury.     Cervical back: Neck supple.     Right lower leg: No edema.     Left lower leg: No edema.     Comments: Patient has good distal strength without clonus.  Skin:    Findings: No erythema or rash.  Neurological:     General: No focal deficit present.     Mental Status: He is alert and oriented to person, place, and time.     Sensory: No sensory deficit.     Motor: No weakness or abnormal muscle tone.     Coordination: Coordination normal.  Psychiatric:        Mood and Affect: Mood normal.        Behavior: Behavior normal.      Imaging: XR C-ARM NO REPORT  Result Date: 08/01/2022 Please see Notes tab for imaging impression.

## 2022-08-01 NOTE — Patient Instructions (Signed)

## 2022-08-04 ENCOUNTER — Encounter: Payer: Self-pay | Admitting: Specialist

## 2022-08-07 ENCOUNTER — Encounter: Payer: Self-pay | Admitting: Specialist

## 2022-08-22 ENCOUNTER — Encounter: Payer: Self-pay | Admitting: Specialist

## 2022-08-22 ENCOUNTER — Ambulatory Visit (INDEPENDENT_AMBULATORY_CARE_PROVIDER_SITE_OTHER): Payer: BC Managed Care – PPO | Admitting: Specialist

## 2022-08-22 VITALS — BP 118/80 | HR 69 | Ht 71.0 in | Wt 207.5 lb

## 2022-08-22 DIAGNOSIS — E119 Type 2 diabetes mellitus without complications: Secondary | ICD-10-CM

## 2022-08-22 DIAGNOSIS — M961 Postlaminectomy syndrome, not elsewhere classified: Secondary | ICD-10-CM

## 2022-08-22 DIAGNOSIS — M4726 Other spondylosis with radiculopathy, lumbar region: Secondary | ICD-10-CM

## 2022-08-22 DIAGNOSIS — M5416 Radiculopathy, lumbar region: Secondary | ICD-10-CM

## 2022-08-22 DIAGNOSIS — M79672 Pain in left foot: Secondary | ICD-10-CM | POA: Diagnosis not present

## 2022-08-22 DIAGNOSIS — M79671 Pain in right foot: Secondary | ICD-10-CM | POA: Diagnosis not present

## 2022-08-22 DIAGNOSIS — Z981 Arthrodesis status: Secondary | ICD-10-CM

## 2022-08-22 DIAGNOSIS — Z794 Long term (current) use of insulin: Secondary | ICD-10-CM

## 2022-08-22 MED ORDER — GABAPENTIN 300 MG PO CAPS: 300.0000 mg | ORAL_CAPSULE | Freq: Four times a day (QID) | ORAL | 4 refills | Status: DC
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
## 2022-08-22 NOTE — Patient Instructions (Signed)
Avoid bending, stooping and avoid lifting weights greater than 10 lbs. Avoid prolong standing and walking. Avoid frequent bending and stooping  No lifting greater than 10 lbs. May use ice or moist heat for pain. Weight loss is of benefit. Handicap license is approved. Dr. Romona Curls secretary/Assistant will call to arrange for epidural steroid injection right L5 and S1. Hydrocodone for pain, gabapentin for neuropathic pain.

## 2022-09-13 ENCOUNTER — Ambulatory Visit: Payer: Self-pay

## 2022-09-13 ENCOUNTER — Ambulatory Visit (INDEPENDENT_AMBULATORY_CARE_PROVIDER_SITE_OTHER): Payer: BC Managed Care – PPO | Admitting: Physical Medicine and Rehabilitation

## 2022-09-13 VITALS — BP 125/73 | HR 83

## 2022-09-13 DIAGNOSIS — M5416 Radiculopathy, lumbar region: Secondary | ICD-10-CM

## 2022-09-13 DIAGNOSIS — M961 Postlaminectomy syndrome, not elsewhere classified: Secondary | ICD-10-CM

## 2022-09-13 DIAGNOSIS — G894 Chronic pain syndrome: Secondary | ICD-10-CM

## 2022-09-13 MED ORDER — METHYLPREDNISOLONE ACETATE 80 MG/ML IJ SUSP
80.0000 mg | Freq: Once | INTRAMUSCULAR | Status: AC
  Administered 2022-09-13: 80 mg

## 2022-09-13 NOTE — Patient Instructions (Signed)

## 2022-09-13 NOTE — Progress Notes (Signed)
Numeric Pain Rating Scale and Functional Assessment Average Pain 8   In the last MONTH (on 0-10 scale) has pain interfered with the following?  1. General activity like being  able to carry out your everyday physical activities such as walking, climbing stairs, carrying groceries, or moving a chair?  Rating(8)   +Driver, -BT, -Dye Allergies.   Any activity makes pain bad. Pain in lower back that radiates down right leg, has numbness in right big toe. Hydrocodone for pain

## 2022-09-23 NOTE — Progress Notes (Signed)
Michael [name] - 56 y.o. male MRN [id number]  Date of birth: 1966-11-04  Office Visit Note: Visit Date: 09/13/2022 PCP: Ermalinda Memos, MD Referred by: Ermalinda Memos, MD  Subjective: Chief Complaint  Patient presents with   Lower Back - Pain   HPI:  Michael [name] is a 56 y.o. male who comes in today at the request of Dr. Vira Browns for planned Right L5-S1 and S1-2 Lumbar Transforaminal epidural steroid injection with fluoroscopic guidance.  The patient has failed conservative care including home exercise, medications, time and activity modification.  This injection will be diagnostic and hopefully therapeutic.  Please see requesting physician notes for further details and justification.   ROS Otherwise per HPI.  Assessment & Plan: Visit Diagnoses:    ICD-10-CM   1. Lumbar radiculopathy  M54.16 XR C-ARM NO REPORT    Epidural Steroid injection    methylPREDNISolone acetate (DEPO-MEDROL) injection 80 mg    2. Post laminectomy syndrome  M96.1     3. Chronic pain syndrome  G89.4       Plan: No additional findings.   Meds & Orders:  Meds ordered this encounter  Medications   methylPREDNISolone acetate (DEPO-MEDROL) injection 80 mg    Orders Placed This Encounter  Procedures   XR C-ARM NO REPORT   Epidural Steroid injection    Follow-up: Return for visit to requesting provider as needed.   Procedures: No procedures performed  Lumbosacral Transforaminal Epidural Steroid Injection - Sub-Pedicular Approach with Fluoroscopic Guidance  Patient: Michael [name]      Date of Birth: [birth year] MRN: [id number] PCP: Ermalinda Memos, MD      Visit Date: 09/13/2022   Universal Protocol:    Date/Time: 09/13/2022  Consent Given By: the patient  Position: PRONE  Additional Comments: Vital signs were monitored before and after the procedure. Patient was prepped and draped in the usual sterile fashion. The correct patient, procedure, and site was  verified.   Injection Procedure Details:   Procedure diagnoses: Lumbar radiculopathy [M54.16]    Meds Administered:  Meds ordered this encounter  Medications   methylPREDNISolone acetate (DEPO-MEDROL) injection 80 mg    Laterality: Right  Location/Site: L5 and S1  Needle:5.0 in., 22 ga.  Short bevel or Quincke spinal needle  Needle Placement: Transforaminal  Findings:    -Comments: Excellent flow of contrast along the nerve, nerve root and into the epidural space.  Procedure Details: After squaring off the end-plates to get a true AP view, the C-arm was positioned so that an oblique view of the foramen as noted above was visualized. The target area is just inferior to the "nose of the scotty dog" or sub pedicular. The soft tissues overlying this structure were infiltrated with 2-3 ml. of 1% Lidocaine without Epinephrine.  The spinal needle was inserted toward the target using a "trajectory" view along the fluoroscope beam.  Under AP and lateral visualization, the needle was advanced so it did not puncture dura and was located close the 6 O'Clock position of the pedical in AP tracterory. Biplanar projections were used to confirm position. Aspiration was confirmed to be negative for CSF and/or blood. A 1-2 ml. volume of Isovue-250 was injected and flow of contrast was noted at each level. Radiographs were obtained for documentation purposes.   After attaining the desired flow of contrast documented above, a 0.5 to 1.0 ml test dose of 0.25% Marcaine was injected into each respective transforaminal space.  The patient was observed for  90 seconds post injection.  After no sensory deficits were reported, and normal lower extremity motor function was noted,   the above injectate was administered so that equal amounts of the injectate were placed at each foramen (level) into the transforaminal epidural space.   Additional Comments:  No complications occurred Dressing: 2 x 2 sterile gauze  and Band-Aid    Post-procedure details: Patient was observed during the procedure. Post-procedure instructions were reviewed.  Patient left the clinic in stable condition.    Clinical History: MRI LUMBAR SPINE WITHOUT AND WITH CONTRAST   TECHNIQUE: Multiplanar and multiecho pulse sequences of the lumbar spine were obtained without and with intravenous contrast.   CONTRAST:  42mL MULTIHANCE GADOBENATE DIMEGLUMINE 529 MG/ML IV SOLN   COMPARISON:  MRI 01/07/2020   FINDINGS: Segmentation:  Standard.   Alignment:  Physiologic.   Vertebrae: No fracture, evidence of discitis, or bone lesion. Prior posterior and interbody fusion at L4-5 and L5-S1.   Conus medullaris and cauda equina: Conus extends to the L1 level. Conus and cauda equina appear normal.   Paraspinal and other soft tissues: No acute findings. Scattered cutaneous and subcutaneous cysts, unchanged.   Disc levels:   T12-L1: No disc protrusion. Mild bilateral facet arthropathy. No foraminal or canal stenosis. Unchanged.   L1-L2: No disc protrusion. Mild bilateral facet arthropathy. No foraminal or canal stenosis. Unchanged.   L2-L3: No disc protrusion. Mild bilateral facet arthropathy. No foraminal or canal stenosis. Unchanged.   L3-L4: Minimal annular disc bulge. Moderate bilateral facet arthropathy and ligamentum flavum buckling. Borderline-mild right foraminal stenosis, slightly progressed. No canal stenosis.   L4-L5: Prior decompression and fusion. Canal and foramina remain widely patent.   L5-S1: Prior decompression and fusion. Canal and foramina remain widely patent.   IMPRESSION: 1. Prior posterior and interbody fusion at L4-5 and L5-S1 without residual stenosis. 2. Slight progression of degenerative changes at L3-L4 with borderline-mild right foraminal stenosis. 3. No canal stenosis at any level.     Electronically Signed   By: Davina Poke D.O.   On: 02/28/2022 13:08     Objective:   VS:  HT:    WT:   BMI:     BP:125/73  HR:83bpm  TEMP: ( )  RESP:  Physical Exam Vitals and nursing note reviewed.  Constitutional:      General: He is not in acute distress.    Appearance: Normal appearance. He is not ill-appearing.  HENT:     Head: Normocephalic and atraumatic.     Right Ear: External ear normal.     Left Ear: External ear normal.     Nose: No congestion.  Eyes:     Extraocular Movements: Extraocular movements intact.  Cardiovascular:     Rate and Rhythm: Normal rate.     Pulses: Normal pulses.  Pulmonary:     Effort: Pulmonary effort is normal. No respiratory distress.  Abdominal:     General: There is no distension.     Palpations: Abdomen is soft.  Musculoskeletal:        General: No tenderness or signs of injury.     Cervical back: Neck supple.     Right lower leg: No edema.     Left lower leg: No edema.     Comments: Patient has good distal strength without clonus.  Skin:    Findings: No erythema or rash.  Neurological:     General: No focal deficit present.     Mental Status: He is alert and oriented  to person, place, and time.     Sensory: No sensory deficit.     Motor: No weakness or abnormal muscle tone.     Coordination: Coordination normal.  Psychiatric:        Mood and Affect: Mood normal.        Behavior: Behavior normal.      Imaging: No results found.

## 2022-09-23 NOTE — Procedures (Signed)
Lumbosacral Transforaminal Epidural Steroid Injection - Sub-Pedicular Approach with Fluoroscopic Guidance  Patient: Michael [name]      Date of Birth: 1965-12-16 MRN: [id number] PCP: Haydee Salter, MD      Visit Date: 09/13/2022   Universal Protocol:    Date/Time: 09/13/2022  Consent Given By: the patient  Position: PRONE  Additional Comments: Vital signs were monitored before and after the procedure. Patient was prepped and draped in the usual sterile fashion. The correct patient, procedure, and site was verified.   Injection Procedure Details:   Procedure diagnoses: Lumbar radiculopathy [M54.16]    Meds Administered:  Meds ordered this encounter  Medications   methylPREDNISolone acetate (DEPO-MEDROL) injection 80 mg    Laterality: Right  Location/Site: L5 and S1  Needle:5.0 in., 22 ga.  Short bevel or Quincke spinal needle  Needle Placement: Transforaminal  Findings:    -Comments: Excellent flow of contrast along the nerve, nerve root and into the epidural space.  Procedure Details: After squaring off the end-plates to get a true AP view, the C-arm was positioned so that an oblique view of the foramen as noted above was visualized. The target area is just inferior to the "nose of the scotty dog" or sub pedicular. The soft tissues overlying this structure were infiltrated with 2-3 ml. of 1% Lidocaine without Epinephrine.  The spinal needle was inserted toward the target using a "trajectory" view along the fluoroscope beam.  Under AP and lateral visualization, the needle was advanced so it did not puncture dura and was located close the 6 O'Clock position of the pedical in AP tracterory. Biplanar projections were used to confirm position. Aspiration was confirmed to be negative for CSF and/or blood. A 1-2 ml. volume of Isovue-250 was injected and flow of contrast was noted at each level. Radiographs were obtained for documentation purposes.   After attaining  the desired flow of contrast documented above, a 0.5 to 1.0 ml test dose of 0.25% Marcaine was injected into each respective transforaminal space.  The patient was observed for 90 seconds post injection.  After no sensory deficits were reported, and normal lower extremity motor function was noted,   the above injectate was administered so that equal amounts of the injectate were placed at each foramen (level) into the transforaminal epidural space.   Additional Comments:  No complications occurred Dressing: 2 x 2 sterile gauze and Band-Aid    Post-procedure details: Patient was observed during the procedure. Post-procedure instructions were reviewed.  Patient left the clinic in stable condition.

## 2022-09-25 ENCOUNTER — Other Ambulatory Visit (HOSPITAL_COMMUNITY): Payer: Self-pay
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
## 2022-09-26 ENCOUNTER — Other Ambulatory Visit (HOSPITAL_COMMUNITY): Payer: Self-pay

## 2022-09-26 MED ORDER — HYDROCODONE-ACETAMINOPHEN 10-325 MG PO TABS
0.5000 | ORAL_TABLET | Freq: Two times a day (BID) | ORAL | 0 refills | Status: AC | PRN
  Filled 2022-09-26 – 2022-09-27 (×3): qty 60, 30d supply, fill #0

## 2022-09-27 ENCOUNTER — Other Ambulatory Visit (HOSPITAL_COMMUNITY): Payer: Self-pay

## 2022-10-03 ENCOUNTER — Ambulatory Visit (INDEPENDENT_AMBULATORY_CARE_PROVIDER_SITE_OTHER): Payer: BC Managed Care – PPO | Admitting: Orthopedic Surgery

## 2022-10-03 ENCOUNTER — Encounter: Payer: Self-pay | Admitting: Orthopedic Surgery

## 2022-10-03 ENCOUNTER — Ambulatory Visit (INDEPENDENT_AMBULATORY_CARE_PROVIDER_SITE_OTHER): Payer: BC Managed Care – PPO

## 2022-10-03 VITALS — BP 121/78 | HR 74 | Ht 71.0 in | Wt 207.5 lb

## 2022-10-03 DIAGNOSIS — M5416 Radiculopathy, lumbar region: Secondary | ICD-10-CM

## 2022-10-03 NOTE — Progress Notes (Signed)
Orthopedic Spine Surgery Office Note  Assessment: Patient is a 56 y.o. male with history of L4-5 and L5-S1 TLIF who presents with improved radicular right leg pain   Plan: -Patient is got significant relief with his most recent L5 and S1 right-sided transforaminal injections with Dr. Alvester Morin.  We will continue to do injections as needed. -Patient gets significant relief with physical therapy, so recommended he continue with home exercises and physical therapy -Patient should return to office on an as-needed basis   Patient expressed understanding of the plan and all questions were answered to the patient's satisfaction.   ___________________________________________________________________________   History:  Patient is a 56 y.o. male who presents today for lumbar spine.  Patient has had years of low back and right leg pain.  His most recent pain has been mostly in the right leg.  He states it starts in his low back and goes down the lateral aspect of his right thigh into the leg and goes down to the dorsal and medial aspect of the foot.  He has no pain radiating down the left leg.  He has a history of neuropathy with decreased sensation and paresthesias in his bilateral feet.  No other numbness and paresthesias.  There is no trauma or injury that brought on his pain.   Weakness: Denies Symptoms of imbalance: Denies Paresthesias and numbness: Yes, has chronic neuropathy.  No recent changes Bowel or bladder incontinence: Denies Saddle anesthesia: Denies  Treatments tried: Surgery, physical therapy, home exercises, Tylenol, injections  Review of systems: Denies fevers and chills, night sweats, unexplained weight loss, history of cancer.  Has had pain that wakes him at night but none recently  Past medical history: Depression Migraines Psoriasis Hyperlipidemia Hypertension Lupus GERD Neuropathy Diabetes Sleep apnea Chronic pain Liver disease Hidradenitis ADD  Allergies:  iodine, penicillin, nickel, chlorhexidine, latex  Past surgical history:  Colon resection Polypectomy LASEK eye surgery Left arm fracture surgery Hernia repair I&D shoulder abscess I&D buttocks L4-S1 TLIF Percutaneous pinning wrist fracture Cubital tunnel release  Social history: Reports use of nicotine product (smoking, vaping, patches, smokeless) -5 packs/week Alcohol use: Denies Denies recreational drug use   Physical Exam:  General: no acute distress, appears stated age Neurologic: alert, answering questions appropriately, following commands Respiratory: unlabored breathing on room air, symmetric chest rise Psychiatric: appropriate affect, normal cadence to speech   MSK (spine):  -Strength exam      Left  Right EHL    4/5  4/5 TA    5/5  5/5 GSC    5/5  5/5 Knee extension  5/5  5/5 Hip flexion   5/5  5/5  -Sensory exam    Sensation intact to light touch in L3-S1 nerve distributions of bilateral lower extremities  -Achilles DTR: 1/4 on the left, 1/4 on the right -Patellar tendon DTR: 1/4 on the left, 1/4 on the right  -Straight leg raise: Negative -Contralateral straight leg raise: Negative -Clonus: no beats bilaterally  -Left hip exam: No pain through range of motion, negative Pearlean Brownie, negative Stinchfield -Right hip exam:  No pain through range of motion, negative Pearlean Brownie, negative Stinchfield  Imaging: XR of the lumbar spine from 10/03/2022 and 08/18/ was independently reviewed and interpreted, showing laminectomy defect at L4-S1. Pedicle screws into L4, L5, and S1. Interbody devices in L4/5 and L5/S1. No evidence of instability on flexion/extension.   MRI of the lumbar spine from 02/21/2022 was independently reviewed and interpreted, showing hypertrophic facets with lateral recess stenosis at L3/4. Decompression from  L4-S1. No other significant stenosis.    Patient name: Milt Coye Patient MRN: [id number] Date of visit: 10/03/22

## 2023-01-31 ENCOUNTER — Ambulatory Visit (INDEPENDENT_AMBULATORY_CARE_PROVIDER_SITE_OTHER): Payer: BC Managed Care – PPO

## 2023-01-31 ENCOUNTER — Ambulatory Visit (INDEPENDENT_AMBULATORY_CARE_PROVIDER_SITE_OTHER): Payer: BC Managed Care – PPO | Admitting: Orthopedic Surgery

## 2023-01-31 DIAGNOSIS — M542 Cervicalgia: Secondary | ICD-10-CM

## 2023-01-31 DIAGNOSIS — M5416 Radiculopathy, lumbar region: Secondary | ICD-10-CM | POA: Diagnosis not present

## 2023-01-31 NOTE — Progress Notes (Addendum)
Orthopedic Spine Surgery Office Note  Assessment: Patient is a 57 y.o. male with 2 issues:  1.  Chronic low back pain and right leg pain that is been getting significant relief with injections 2.  Neck pain that radiates into bilateral shoulders.  Possible radiculopathy   Plan: -For his neck, explained that initially conservative treatment is tried as a significant number of patients may experience relief with these treatment modalities. Discussed that the conservative treatments include:  -activity modification  -physical therapy  -over the counter pain medications  -medrol dosepak  -cervical steroid injections -Patient has tried Surgery, physical therapy, home exercises, Tylenol, injections  -For his low back, he has gotten 80% relief with injections in the last for several months so recommended repeat injection -In regards to his neck, recommended home exercise program and physical therapy.  He is already established with physical therapy so I told him that they should work on cervical stabilization, range of motion, traction -If he is not doing any better with his neck and radiating shoulder pain, would recommend MRI of the cervical spine -Would need to quit nicotine containing products if elective surgery is ever considered as a treatment option -Patient should return to office in 8 weeks, x-rays at next visit: none   Patient expressed understanding of the plan and all questions were answered to the patient's satisfaction.   ___________________________________________________________________________   History:  Patient is a 57 y.o. male who presents today for cervical and lumbar spine.  In regards to his low back, he has noticed years of low back pain that radiates into his right leg.  He had undergone surgery which did give him some improvement but not complete relief.  He has been doing injections with Dr. Ernestina Patches which do provide him with significant relief.  He says he gets at  least 80% relief in the last for several months.  He is interested in another 1 today.  He is not having any new pain.  No numbness or paresthesias except the chronic ones in his feet from neuropathy.  No saddle anesthesia.  No bowel or bladder incontinence.  Then, he also wanted talk about his neck.  For the last 3 months or so, he has noticed pain in his neck that radiates into his bilateral shoulders.  There is no trauma or injury that brought on this pain.  Pain is generally worse in the morning and as he loosens things up he gets better.  He has not noticed any weakness.  He does not have any numbness or tingling.  He has no issues with hand dexterity.  Has not noticed any unsteadiness with gait.  No pain radiating past the shoulder.   Weakness: Denies Difficulty with fine motor skills (e.g., buttoning shirts, handwriting): Denies Symptoms of imbalance: Denies Paresthesias and numbness: yes, in his feet. Has chronic neuropathy. No recent changes Bowel or bladder incontinence: denies Saddle anesthesia: denies  Treatments tried: Surgery, physical therapy, home exercises, Tylenol, injections   Physical Exam:  General: no acute distress, appears stated age Neurologic: alert, answering questions appropriately, following commands Respiratory: unlabored breathing on room air, symmetric chest rise Psychiatric: appropriate affect, normal cadence to speech   MSK (spine):  -Strength exam      Left  Right Grip strength                5/5  5/5 Interosseus   5/5   5/5 Wrist extension  5/5  5/5 Wrist flexion   5/5  5/5 Elbow  flexion   5/5  5/5 Deltoid    5/5  5/5  EHL    4/5  4/5 TA    5/5  5/5 GSC    5/5  5/5 Knee extension  5/5  5/5 Hip flexion   5/5  5/5  -Sensory exam    Sensation intact to light touch in L3-S1 nerve distributions of bilateral lower extremities  Sensation intact to light touch in C5-T1 nerve distributions of bilateral upper extremities  -Brachioradialis DTR:  1/4 on the left, 1/4 on the right -Biceps DTR: 1/4 on the left, 1/4 on the right -Achilles DTR: 1/4 on the left, 1/4 on the right -Patellar tendon DTR: 1/4 on the left, 1/4 on the right  -Spurling: Negative bilaterally -Hoffman sign: Negative bilaterally -Clonus: No beats bilaterally -Interosseous wasting: None seen  -Grip and release test: Negative -Romberg: Negative -Gait: Normal  Left shoulder exam: No pain to range of motion, negative Jobe, negative belly press, no weakness with external rotation with arm at side Right shoulder exam: No pain to range of motion, negative Jobe, negative belly press, no weakness with external rotation with arm at side  Imaging: XR of the cervical spine from 01/31/2023 was independently reviewed and interpreted, showing disc height loss at C5/6 and C6/7.  No fracture or dislocation seen.  No evidence of instability on flexion/extension views.  XR of the lumbar spine from 10/03/2022 and 08/18/ was previously independently reviewed and interpreted, showing laminectomy defect at L4-S1. Pedicle screws into L4, L5, and S1. Interbody devices in L4/5 and L5/S1. No evidence of instability on flexion/extension.    MRI of the lumbar spine from 02/21/2022 was previously independently reviewed and interpreted, showing hypertrophic facets with lateral recess stenosis at L3/4. Decompression from L4-S1. No other significant stenosis.    Patient name: Michael [name] Patient MRN: [id number] Date of visit: 01/31/23

## 2023-02-12 ENCOUNTER — Telehealth: Payer: Self-pay | Admitting: Physical Medicine and Rehabilitation

## 2023-02-12 NOTE — Telephone Encounter (Signed)
Pt called requesting a call back from [place] to set referral appt from Dr Laurance Flatten. Please call pt at [phone].

## 2023-02-13 NOTE — Telephone Encounter (Signed)
Patient scheduled for injection.

## 2023-02-25 ENCOUNTER — Ambulatory Visit (INDEPENDENT_AMBULATORY_CARE_PROVIDER_SITE_OTHER): Payer: BC Managed Care – PPO | Admitting: Physical Medicine and Rehabilitation

## 2023-02-25 ENCOUNTER — Other Ambulatory Visit: Payer: Self-pay

## 2023-02-25 VITALS — BP 130/79 | HR 80

## 2023-02-25 DIAGNOSIS — M5416 Radiculopathy, lumbar region: Secondary | ICD-10-CM

## 2023-02-25 MED ORDER — METHYLPREDNISOLONE ACETATE 80 MG/ML IJ SUSP
80.0000 mg | Freq: Once | INTRAMUSCULAR | Status: AC
  Administered 2023-02-25: 80 mg

## 2023-02-25 NOTE — Patient Instructions (Signed)

## 2023-02-25 NOTE — Progress Notes (Signed)
Functional Pain Scale - descriptive words and definitions  Distressing (6)    Pain is present/unable to complete most ADLs limited by pain/sleep is difficult and active distraction is only marginal. Moderate range order  Average Pain 6   +Driver, -BT, -Dye Allergies.  Lower back pain on right side that radiates on both sides, numbness on inside of right foot. Lying down makes pain worse

## 2023-03-02 ENCOUNTER — Encounter: Payer: Self-pay | Admitting: Orthopedic Surgery

## 2023-03-02 NOTE — Procedures (Signed)
Lumbosacral Transforaminal Epidural Steroid Injection - Sub-Pedicular Approach with Fluoroscopic Guidance  Patient: Michael [name]      Date of Birth: 03/14/1966 MRN: [id number] PCP: Ermalinda Memos, MD      Visit Date: 02/25/2023   Universal Protocol:    Date/Time: 02/25/2023  Consent Given By: the patient  Position: PRONE  Additional Comments: Vital signs were monitored before and after the procedure. Patient was prepped and draped in the usual sterile fashion. The correct patient, procedure, and site was verified.   Injection Procedure Details:   Procedure diagnoses: Lumbar radiculopathy [M54.16]    Meds Administered:  Meds ordered this encounter  Medications   methylPREDNISolone acetate (DEPO-MEDROL) injection 80 mg    Laterality: Right  Location/Site: L5 and S1  Needle:5.0 in., 22 ga.  Short bevel or Quincke spinal needle  Needle Placement: Transforaminal  Findings:    -Comments: Excellent flow of contrast along the nerve, nerve root and into the epidural space.  Procedure Details: After squaring off the end-plates to get a true AP view, the C-arm was positioned so that an oblique view of the foramen as noted above was visualized. The target area is just inferior to the "nose of the scotty dog" or sub pedicular. The soft tissues overlying this structure were infiltrated with 2-3 ml. of 1% Lidocaine without Epinephrine.  The spinal needle was inserted toward the target using a "trajectory" view along the fluoroscope beam.  Under AP and lateral visualization, the needle was advanced so it did not puncture dura and was located close the 6 O'Clock position of the pedical in AP tracterory. Biplanar projections were used to confirm position. Aspiration was confirmed to be negative for CSF and/or blood. A 1-2 ml. volume of Isovue-250 was injected and flow of contrast was noted at each level. Radiographs were obtained for documentation purposes.   After attaining  the desired flow of contrast documented above, a 0.5 to 1.0 ml test dose of 0.25% Marcaine was injected into each respective transforaminal space.  The patient was observed for 90 seconds post injection.  After no sensory deficits were reported, and normal lower extremity motor function was noted,   the above injectate was administered so that equal amounts of the injectate were placed at each foramen (level) into the transforaminal epidural space.   Additional Comments:  No complications occurred Dressing: 2 x 2 sterile gauze and Band-Aid    Post-procedure details: Patient was observed during the procedure. Post-procedure instructions were reviewed.  Patient left the clinic in stable condition.

## 2023-03-02 NOTE — Progress Notes (Signed)
Michael [name] - 57 y.o. male MRN [id number]  Date of birth: 12/09/1965  Office Visit Note: Visit Date: 02/25/2023 PCP: Ermalinda Memosowlen, Hugh, MD Referred by: London SheerMoore, Michael A, MD  Subjective: Chief Complaint  Patient presents with   Lower Back - Pain   HPI:  Michael [name] is a 57 y.o. male who comes in today at the request of Dr. Willia CrazeMichael Moore for planned Right L5-S1 and S1-2 Lumbar Transforaminal epidural steroid injection with fluoroscopic guidance.  The patient has failed conservative care including home exercise, medications, time and activity modification.  This injection will be diagnostic and hopefully therapeutic.  Please see requesting physician notes for further details and justification.  Patient asking about cervical spine injections today.  I did direct him back towards Dr. Christell ConstantMoore for further evaluation but we did tell him that we can do certain procedures of the cervical spine as well.   ROS Otherwise per HPI.  Assessment & Plan: Visit Diagnoses:    ICD-10-CM   1. Lumbar radiculopathy  M54.16 XR C-ARM NO REPORT    Epidural Steroid injection    methylPREDNISolone acetate (DEPO-MEDROL) injection 80 mg      Plan: No additional findings.   Meds & Orders:  Meds ordered this encounter  Medications   methylPREDNISolone acetate (DEPO-MEDROL) injection 80 mg    Orders Placed This Encounter  Procedures   XR C-ARM NO REPORT   Epidural Steroid injection    Follow-up: Return for visit to requesting provider as needed.   Procedures: No procedures performed  Lumbosacral Transforaminal Epidural Steroid Injection - Sub-Pedicular Approach with Fluoroscopic Guidance  Patient: Michael [name]      Date of Birth: 01/17/1966 MRN: [id number] PCP: Ermalinda Memosowlen, Hugh, MD      Visit Date: 02/25/2023   Universal Protocol:    Date/Time: 02/25/2023  Consent Given By: the patient  Position: PRONE  Additional Comments: Vital signs were monitored before  and after the procedure. Patient was prepped and draped in the usual sterile fashion. The correct patient, procedure, and site was verified.   Injection Procedure Details:   Procedure diagnoses: Lumbar radiculopathy [M54.16]    Meds Administered:  Meds ordered this encounter  Medications   methylPREDNISolone acetate (DEPO-MEDROL) injection 80 mg    Laterality: Right  Location/Site: L5 and S1  Needle:5.0 in., 22 ga.  Short bevel or Quincke spinal needle  Needle Placement: Transforaminal  Findings:    -Comments: Excellent flow of contrast along the nerve, nerve root and into the epidural space.  Procedure Details: After squaring off the end-plates to get a true AP view, the C-arm was positioned so that an oblique view of the foramen as noted above was visualized. The target area is just inferior to the "nose of the scotty dog" or sub pedicular. The soft tissues overlying this structure were infiltrated with 2-3 ml. of 1% Lidocaine without Epinephrine.  The spinal needle was inserted toward the target using a "trajectory" view along the fluoroscope beam.  Under AP and lateral visualization, the needle was advanced so it did not puncture dura and was located close the 6 O'Clock position of the pedical in AP tracterory. Biplanar projections were used to confirm position. Aspiration was confirmed to be negative for CSF and/or blood. A 1-2 ml. volume of Isovue-250 was injected and flow of contrast was noted at each level. Radiographs were obtained for documentation purposes.   After attaining the desired flow of contrast documented above, a 0.5 to 1.0 ml  test dose of 0.25% Marcaine was injected into each respective transforaminal space.  The patient was observed for 90 seconds post injection.  After no sensory deficits were reported, and normal lower extremity motor function was noted,   the above injectate was administered so that equal amounts of the injectate were placed at each foramen  (level) into the transforaminal epidural space.   Additional Comments:  No complications occurred Dressing: 2 x 2 sterile gauze and Band-Aid    Post-procedure details: Patient was observed during the procedure. Post-procedure instructions were reviewed.  Patient left the clinic in stable condition.    Clinical History: MRI LUMBAR SPINE WITHOUT AND WITH CONTRAST   TECHNIQUE: Multiplanar and multiecho pulse sequences of the lumbar spine were obtained without and with intravenous contrast.   CONTRAST:  17mL MULTIHANCE GADOBENATE DIMEGLUMINE 529 MG/ML IV SOLN   COMPARISON:  MRI 01/07/2020   FINDINGS: Segmentation:  Standard.   Alignment:  Physiologic.   Vertebrae: No fracture, evidence of discitis, or bone lesion. Prior posterior and interbody fusion at L4-5 and L5-S1.   Conus medullaris and cauda equina: Conus extends to the L1 level. Conus and cauda equina appear normal.   Paraspinal and other soft tissues: No acute findings. Scattered cutaneous and subcutaneous cysts, unchanged.   Disc levels:   T12-L1: No disc protrusion. Mild bilateral facet arthropathy. No foraminal or canal stenosis. Unchanged.   L1-L2: No disc protrusion. Mild bilateral facet arthropathy. No foraminal or canal stenosis. Unchanged.   L2-L3: No disc protrusion. Mild bilateral facet arthropathy. No foraminal or canal stenosis. Unchanged.   L3-L4: Minimal annular disc bulge. Moderate bilateral facet arthropathy and ligamentum flavum buckling. Borderline-mild right foraminal stenosis, slightly progressed. No canal stenosis.   L4-L5: Prior decompression and fusion. Canal and foramina remain widely patent.   L5-S1: Prior decompression and fusion. Canal and foramina remain widely patent.   IMPRESSION: 1. Prior posterior and interbody fusion at L4-5 and L5-S1 without residual stenosis. 2. Slight progression of degenerative changes at L3-L4 with borderline-mild right foraminal stenosis. 3.  No canal stenosis at any level.     Electronically Signed   By: Michael Guess D.O.   On: 02/28/2022 13:08     Objective:  VS:  HT:    WT:   BMI:     BP:130/79  HR:80bpm  TEMP: ( )  RESP:  Physical Exam Vitals and nursing note reviewed.  Constitutional:      General: He is not in acute distress.    Appearance: Normal appearance. He is not ill-appearing.  HENT:     Head: Normocephalic and atraumatic.     Right Ear: External ear normal.     Left Ear: External ear normal.     Nose: No congestion.  Eyes:     Extraocular Movements: Extraocular movements intact.  Cardiovascular:     Rate and Rhythm: Normal rate.     Pulses: Normal pulses.  Pulmonary:     Effort: Pulmonary effort is normal. No respiratory distress.  Abdominal:     General: There is no distension.     Palpations: Abdomen is soft.  Musculoskeletal:        General: No tenderness or signs of injury.     Cervical back: Neck supple.     Right lower leg: No edema.     Left lower leg: No edema.     Comments: Patient has good distal strength without clonus.  Skin:    Findings: No erythema or rash.  Neurological:  General: No focal deficit present.     Mental Status: He is alert and oriented to person, place, and time.     Sensory: No sensory deficit.     Motor: No weakness or abnormal muscle tone.     Coordination: Coordination normal.  Psychiatric:        Mood and Affect: Mood normal.        Behavior: Behavior normal.      Imaging: No results found.

## 2023-03-04 ENCOUNTER — Telehealth: Payer: Self-pay | Admitting: Orthopedic Surgery

## 2023-03-04 NOTE — Telephone Encounter (Signed)
Orthopedic Note  Patient still having neck and bilateral shoulder pain. He was inquiring about an MRI. I asked him to give conservative treatment more time to work. If he is not getting any relief in a couple of more weeks, then I will order an MRI.  London Sheer, MD Orthopedic Surgeon

## 2023-03-28 ENCOUNTER — Ambulatory Visit (INDEPENDENT_AMBULATORY_CARE_PROVIDER_SITE_OTHER): Payer: BC Managed Care – PPO | Admitting: Orthopedic Surgery

## 2023-03-28 DIAGNOSIS — M5412 Radiculopathy, cervical region: Secondary | ICD-10-CM

## 2023-03-28 NOTE — Progress Notes (Signed)
Orthopedic Spine Surgery Office Note  Assessment: Patient is a 57 y.o. male with neck pain that radiates into bilateral shoulder. Suspect radiculopathy   Plan: -Patient has tried home exercise program, PT -Patient has tried conservative treatment for over six weeks now without any relief of his symptoms, so recommended MRI of the cervical spine to evaluate for radiculopathy -Would need to quit nicotine containing products prior to any elective spine surgery -Patient should return to office in 4-6 weeks, x-rays at next visit: none   Patient expressed understanding of the plan and all questions were answered to the patient's satisfaction.   __________________________________________________________________________  History: Patient is a 57 y.o. male who has been previously seen in the office for symptoms consistent with cervical radiculopathy.  Since last time I saw him, he has been working with physical therapy and doing a home exercise program for his neck pain that radiates into his bilateral shoulders.  He states that he got a little bit of improvement but is still having significant pain especially on the left side of his neck.  It radiates into the bilateral shoulders.  It does not radiate past the shoulder on either side.  Denies paresthesias and numbness in the upper extremities.  Pain is worse with activity and does improve if he lays down.  Has not noticed any weakness.  No bowel or bladder incontinence.  Denies saddle anesthesia.  Previous treatments: surgery, physical therapy, home exercises, Tylenol, injections   Physical Exam:  General: no acute distress, appears stated age Neurologic: alert, answering questions appropriately, following commands Respiratory: unlabored breathing on room air, symmetric chest rise Psychiatric: appropriate affect, normal cadence to speech   MSK (spine):   -Strength exam                                                   Left                   Right Grip strength                5/5                  5/5 Interosseus                  5/5                  5/5 Wrist extension            5/5                  5/5 Wrist flexion                 5/5                  5/5 Elbow flexion                5/5                  5/5 Deltoid                          5/5                  5/5   EHL  4/5                  4/5 TA                                 5/5                  5/5 GSC                             5/5                  5/5 Knee extension            5/5                  5/5 Hip flexion                    5/5                  5/5   -Sensory exam                           Sensation intact to light touch in L3-S1 nerve distributions of bilateral lower extremities             Sensation intact to light touch in C5-T1 nerve distributions of bilateral upper extremities   -Brachioradialis DTR: 1/4 on the left, 1/4 on the right -Biceps DTR: 1/4 on the left, 1/4 on the right -Achilles DTR: 1/4 on the left, 1/4 on the right -Patellar tendon DTR: 1/4 on the left, 1/4 on the right   -Spurling: Negative bilaterally -Hoffman sign: Negative bilaterally -Clonus: No beats bilaterally -Interosseous wasting: None seen  -Grip and release test: Negative -Romberg: Negative -Gait: Normal   Left shoulder exam: No pain to range of motion, negative Jobe, negative belly press, no weakness with external rotation with arm at side Right shoulder exam: No pain to range of motion, negative Jobe, negative belly press, no weakness with external rotation with arm at side  Imaging: XR of the cervical spine from 01/31/2023 was previously independently reviewed and interpreted, showing disc height loss at C5/6 and C6/7.  No fracture or dislocation seen.  No evidence of instability on flexion/extension views.   XR of the lumbar spine from 10/03/2022 and 08/18/ was previously independently reviewed and interpreted, showing laminectomy  defect at L4-S1. Pedicle screws into L4, L5, and S1. Interbody devices in L4/5 and L5/S1. No evidence of instability on flexion/extension.    MRI of the lumbar spine from 02/21/2022 was previously independently reviewed and interpreted, showing hypertrophic facets with lateral recess stenosis at L3/4. Decompression from L4-S1. No other significant stenosis.     Patient name: Michael [name] Patient MRN: [id number] Date of visit: 03/28/23

## 2023-03-28 NOTE — Addendum Note (Signed)
Addended by: Willia Craze on: 03/28/2023 09:56 AM   Modules accepted: Orders

## 2023-04-10 ENCOUNTER — Encounter: Payer: Self-pay | Admitting: Radiology

## 2023-04-13 ENCOUNTER — Ambulatory Visit
Admission: RE | Admit: 2023-04-13 | Discharge: 2023-04-13 | Disposition: A | Discharge status: Home / Self Care | Payer: BC Managed Care – PPO | Source: Ambulatory Visit | Attending: Orthopedic Surgery | Admitting: Orthopedic Surgery

## 2023-04-13 DIAGNOSIS — M5412 Radiculopathy, cervical region: Secondary | ICD-10-CM

## 2023-04-19 ENCOUNTER — Ambulatory Visit: Payer: BC Managed Care – PPO | Admitting: Orthopedic Surgery

## 2023-05-01 ENCOUNTER — Ambulatory Visit (INDEPENDENT_AMBULATORY_CARE_PROVIDER_SITE_OTHER): Payer: BC Managed Care – PPO | Admitting: Orthopedic Surgery

## 2023-05-01 DIAGNOSIS — M5412 Radiculopathy, cervical region: Secondary | ICD-10-CM

## 2023-05-01 NOTE — Progress Notes (Signed)
Orthopedic Spine Surgery Office Note   Assessment: Patient is a 57 y.o. male with neck pain that radiates into bilateral shoulders. Has foraminal stenosis at C3/4.  Also has facet edema at that level on the left.     Plan: -Patient has tried home exercise program, PT -Recommended diagnostic/therapeutic cervical injection -Would need to quit nicotine containing products prior to any elective spine surgery -Patient should return to office on an as needed basis     Patient expressed understanding of the plan and all questions were answered to the patient's satisfaction.    __________________________________________________________________________   History: Patient is a 57 y.o. male who has been previously seen in the office for symptoms consistent with cervical radiculopathy.  Since the last time he was seen, his pain has remained the same.  He is having pain that goes from his neck into his bilateral shoulders.  He notes it is more significant on the left side.  He feels it in the area of the trapezius muscles.  It does not radiate past the shoulder.  He has not noticed any new symptoms since I last saw him.   Previous treatments: home exercise program, PT   Physical Exam:   General: no acute distress, appears stated age Neurologic: alert, answering questions appropriately, following commands Respiratory: unlabored breathing on room air, symmetric chest rise Psychiatric: appropriate affect, normal cadence to speech     MSK (spine):   -Strength exam                                                   Left                  Right Grip strength                5/5                  5/5 Interosseus                  5/5                  5/5 Wrist extension            5/5                  5/5 Wrist flexion                 5/5                  5/5 Elbow flexion                5/5                  5/5 Deltoid                          5/5                  5/5   EHL                               4/5                  4/5 TA  5/5                  5/5 GSC                             5/5                  5/5 Knee extension            5/5                  5/5 Hip flexion                    5/5                  5/5   -Sensory exam                           Sensation intact to light touch in L3-S1 nerve distributions of bilateral lower extremities             Sensation intact to light touch in C5-T1 nerve distributions of bilateral upper extremities   Imaging: XR of the cervical spine from 01/31/2023 was previously independently reviewed and interpreted, showing disc height loss at C5/6 and C6/7.  No fracture or dislocation seen.  No evidence of instability on flexion/extension views.   MRI of the cervical spine from 04/13/2023 was independently reviewed and interpreted, showing bilateral (left>right) foraminal stenosis at C3/4.  Disc bulge at C5/6 with effacement of the ventral thecal sac but no significant stenosis seen.  No T2 cord signal change seen.  Left C3/4 edema and effusion within the facet joint.     Patient name: Michael [name] Patient MRN: [id number] Date of visit: 05/01/23

## 2023-05-16 ENCOUNTER — Other Ambulatory Visit: Payer: Self-pay

## 2023-05-16 ENCOUNTER — Ambulatory Visit (INDEPENDENT_AMBULATORY_CARE_PROVIDER_SITE_OTHER): Payer: BC Managed Care – PPO | Admitting: Physical Medicine and Rehabilitation

## 2023-05-16 VITALS — BP 104/61 | HR 74

## 2023-05-16 DIAGNOSIS — M5412 Radiculopathy, cervical region: Secondary | ICD-10-CM

## 2023-05-16 MED ORDER — METHYLPREDNISOLONE ACETATE 80 MG/ML IJ SUSP
80.0000 mg | Freq: Once | INTRAMUSCULAR | Status: AC
Start: 2023-05-16 — End: 2023-05-16
  Administered 2023-05-16: 80 mg

## 2023-05-16 NOTE — Progress Notes (Signed)
Functional Pain Scale - descriptive words and definitions  Moderate (4)   Constantly aware of pain, can complete ADLs with modification/sleep marginally affected at times/passive distraction is of no use, but active distraction gives some relief. Moderate range order  Average Pain  varies   +Driver, -BT, -Dye Allergies.  Neck pain on left side 

## 2023-05-16 NOTE — Patient Instructions (Signed)

## 2023-05-23 NOTE — Procedures (Signed)
Cervical Epidural Steroid Injection - Interlaminar Approach with Fluoroscopic Guidance  Patient: Michael [name]      Date of Birth: [birth year] MRN: [id number] PCP: Ermalinda Memos, MD      Visit Date: 05/16/2023   Universal Protocol:    Date/Time: 06/27/247:31 PM  Consent Given By: the patient  Position: PRONE  Additional Comments: Vital signs were monitored before and after the procedure. Patient was prepped and draped in the usual sterile fashion. The correct patient, procedure, and site was verified.   Injection Procedure Details:   Procedure diagnoses: Cervical radiculopathy [M54.12]    Meds Administered:  Meds ordered this encounter  Medications   methylPREDNISolone acetate (DEPO-MEDROL) injection 80 mg     Laterality: Left  Location/Site: C7-T1  Needle: 3.5 in., 20 ga. Tuohy  Needle Placement: Paramedian epidural space  Findings:  -Comments: Excellent flow of contrast into the epidural space.  Procedure Details: Using a paramedian approach from the side mentioned above, the region overlying the inferior lamina was localized under fluoroscopic visualization and the soft tissues overlying this structure were infiltrated with 4 ml. of 1% Lidocaine without Epinephrine. A # 20 gauge, Tuohy needle was inserted into the epidural space using a paramedian approach.  The epidural space was localized using loss of resistance along with contralateral oblique bi-planar fluoroscopic views.  After negative aspirate for air, blood, and CSF, a 2 ml. volume of Isovue-250 was injected into the epidural space and the flow of contrast was observed. Radiographs were obtained for documentation purposes.   The injectate was administered into the level noted above.  Additional Comments:  No complications occurred Dressing: 2 x 2 sterile gauze and Band-Aid    Post-procedure details: Patient was observed during the procedure. Post-procedure instructions were  reviewed.  Patient left the clinic in stable condition.

## 2023-05-23 NOTE — Progress Notes (Signed)
Michael [name] - 57 y.o. male MRN [id number]  Date of birth: 29-Dec-1965  Office Visit Note: Visit Date: 05/16/2023 PCP: Ermalinda Memos, MD Referred by: London Sheer, MD  Subjective: Chief Complaint  Patient presents with   Neck - Pain   HPI:  Michael [name] is a 57 y.o. male who comes in today at the request of Dr. Willia Craze for planned Left C7-T1 Cervical Interlaminar epidural steroid injection with fluoroscopic guidance.  The patient has failed conservative care including home exercise, medications, time and activity modification.  This injection will be diagnostic and hopefully therapeutic.  Please see requesting physician notes for further details and justification.   ROS Otherwise per HPI.  Assessment & Plan: Visit Diagnoses:    ICD-10-CM   1. Cervical radiculopathy  M54.12 XR C-ARM NO REPORT    Epidural Steroid injection    methylPREDNISolone acetate (DEPO-MEDROL) injection 80 mg      Plan: No additional findings.   Meds & Orders:  Meds ordered this encounter  Medications   methylPREDNISolone acetate (DEPO-MEDROL) injection 80 mg    Orders Placed This Encounter  Procedures   XR C-ARM NO REPORT   Epidural Steroid injection    Follow-up: Return for visit to requesting provider as needed.   Procedures: No procedures performed  Cervical Epidural Steroid Injection - Interlaminar Approach with Fluoroscopic Guidance  Patient: Michael [name]      Date of Birth: 01/08/1966 MRN: [id number] PCP: Ermalinda Memos, MD      Visit Date: 05/16/2023   Universal Protocol:    Date/Time: 06/27/247:31 PM  Consent Given By: the patient  Position: PRONE  Additional Comments: Vital signs were monitored before and after the procedure. Patient was prepped and draped in the usual sterile fashion. The correct patient, procedure, and site was verified.   Injection Procedure Details:   Procedure diagnoses: Cervical radiculopathy [M54.12]     Meds Administered:  Meds ordered this encounter  Medications   methylPREDNISolone acetate (DEPO-MEDROL) injection 80 mg     Laterality: Left  Location/Site: C7-T1  Needle: 3.5 in., 20 ga. Tuohy  Needle Placement: Paramedian epidural space  Findings:  -Comments: Excellent flow of contrast into the epidural space.  Procedure Details: Using a paramedian approach from the side mentioned above, the region overlying the inferior lamina was localized under fluoroscopic visualization and the soft tissues overlying this structure were infiltrated with 4 ml. of 1% Lidocaine without Epinephrine. A # 20 gauge, Tuohy needle was inserted into the epidural space using a paramedian approach.  The epidural space was localized using loss of resistance along with contralateral oblique bi-planar fluoroscopic views.  After negative aspirate for air, blood, and CSF, a 2 ml. volume of Isovue-250 was injected into the epidural space and the flow of contrast was observed. Radiographs were obtained for documentation purposes.   The injectate was administered into the level noted above.  Additional Comments:  No complications occurred Dressing: 2 x 2 sterile gauze and Band-Aid    Post-procedure details: Patient was observed during the procedure. Post-procedure instructions were reviewed.  Patient left the clinic in stable condition.   Clinical History: MRI LUMBAR SPINE WITHOUT AND WITH CONTRAST   TECHNIQUE: Multiplanar and multiecho pulse sequences of the lumbar spine were obtained without and with intravenous contrast.   CONTRAST:  19mL MULTIHANCE GADOBENATE DIMEGLUMINE 529 MG/ML IV SOLN   COMPARISON:  MRI 01/07/2020   FINDINGS: Segmentation:  Standard.   Alignment:  Physiologic.   Vertebrae:  No fracture, evidence of discitis, or bone lesion. Prior posterior and interbody fusion at L4-5 and L5-S1.   Conus medullaris and cauda equina: Conus extends to the L1 level. Conus and cauda  equina appear normal.   Paraspinal and other soft tissues: No acute findings. Scattered cutaneous and subcutaneous cysts, unchanged.   Disc levels:   T12-L1: No disc protrusion. Mild bilateral facet arthropathy. No foraminal or canal stenosis. Unchanged.   L1-L2: No disc protrusion. Mild bilateral facet arthropathy. No foraminal or canal stenosis. Unchanged.   L2-L3: No disc protrusion. Mild bilateral facet arthropathy. No foraminal or canal stenosis. Unchanged.   L3-L4: Minimal annular disc bulge. Moderate bilateral facet arthropathy and ligamentum flavum buckling. Borderline-mild right foraminal stenosis, slightly progressed. No canal stenosis.   L4-L5: Prior decompression and fusion. Canal and foramina remain widely patent.   L5-S1: Prior decompression and fusion. Canal and foramina remain widely patent.   IMPRESSION: 1. Prior posterior and interbody fusion at L4-5 and L5-S1 without residual stenosis. 2. Slight progression of degenerative changes at L3-L4 with borderline-mild right foraminal stenosis. 3. No canal stenosis at any level.     Electronically Signed   By: Duanne Guess D.O.   On: 02/28/2022 13:08     Objective:  VS:  HT:    WT:   BMI:     BP:104/61  HR:74bpm  TEMP: ( )  RESP:  Physical Exam Vitals and nursing note reviewed.  Constitutional:      General: He is not in acute distress.    Appearance: Normal appearance. He is not ill-appearing.  HENT:     Head: Normocephalic and atraumatic.     Right Ear: External ear normal.     Left Ear: External ear normal.  Eyes:     Extraocular Movements: Extraocular movements intact.  Cardiovascular:     Rate and Rhythm: Normal rate.     Pulses: Normal pulses.  Abdominal:     General: There is no distension.     Palpations: Abdomen is soft.  Musculoskeletal:        General: No signs of injury.     Cervical back: Neck supple. Tenderness present. No rigidity.     Right lower leg: No edema.     Left  lower leg: No edema.     Comments: Patient has good strength in the upper extremities with 5 out of 5 strength in wrist extension long finger flexion APB.  No intrinsic hand muscle atrophy.  Negative Hoffmann's test.  Lymphadenopathy:     Cervical: No cervical adenopathy.  Skin:    Findings: No erythema or rash.  Neurological:     General: No focal deficit present.     Mental Status: He is alert and oriented to person, place, and time.     Sensory: No sensory deficit.     Motor: No weakness or abnormal muscle tone.     Coordination: Coordination normal.  Psychiatric:        Mood and Affect: Mood normal.        Behavior: Behavior normal.      Imaging: No results found.

## 2023-08-08 ENCOUNTER — Ambulatory Visit: Payer: BC Managed Care – PPO | Admitting: Orthopedic Surgery

## 2023-09-06 ENCOUNTER — Ambulatory Visit: Payer: BC Managed Care – PPO | Admitting: Orthopedic Surgery

## 2023-09-06 ENCOUNTER — Other Ambulatory Visit (INDEPENDENT_AMBULATORY_CARE_PROVIDER_SITE_OTHER): Payer: BC Managed Care – PPO

## 2023-09-06 DIAGNOSIS — M5412 Radiculopathy, cervical region: Secondary | ICD-10-CM

## 2023-09-06 NOTE — Progress Notes (Signed)
Orthopedic Spine Surgery Office Note   Assessment: Patient is a 57 y.o. male with neck pain that radiates into bilateral shoulders. 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  After this discussion, patient wanted to think about it further -Patient will call the office with how he would like to proceed     The patient has cervical radiculopathy, which was initially treated with non-operative measures. The patient's symptoms failed to improve with conservative treatments, so operative management was discussed in the form of C3/4 anterior cervical discectomy and fusion. The risks, including but not limited to pseudarthrosis, dysphagia, hematoma, airway compromise, recurrent laryngeal nerve injury, esophageal perforation, durotomy, spinal cord injury, nerve root injury, persistent pain, adjacent segment disease, infection, bleeding, hardware failure, vascular injury, death, stroke, and need for additional procedures were discussed with the patient.  Explained that his risks particularly infection and pseudoarthrosis would be higher with his active nicotine use.  The benefit of surgery would be improvement in the patient's radiating arm pain. Explained that he may not get full relief of his pain with this surgery, especially any neck pain. The alternatives to surgical management would be continued monitoring, physical therapy, over-the-counter pain medications, injections, traction, and activity modification. All the patient's questions were answered to his satisfaction. After this discussion, the patient wanted to think about it further and let me know how he would like to proceed.    __________________________________________________________________________   History: Patient is a 57  y.o. male who has been previously seen in the office for symptoms consistent with cervical radiculopathy.  He says that his pain is worse since the last time that I saw him.  He is having neck pain that radiates into the bilateral shoulders.  It is much more significant on the left side.  He feels it goes into the left trapezius muscle to the lateral aspect of the shoulder.  He notes that the pain is worse if he looks to his left.  He does not have the same experience when he looks to the right.  After last visit, he got a cervical injection which provided him with about a month of relief and then pain returned.    Previous treatments: home exercise program, PT, cervical ESI, gabapentin    Physical Exam:   General: no acute distress, appears stated age Neurologic: alert, answering questions appropriately, following commands Respiratory: unlabored breathing on room air, symmetric chest rise Psychiatric: appropriate affect, normal cadence to speech     MSK (spine):   -Strength exam                                                   Left                  Right Grip strength                5/5                  5/5 Interosseus                  5/5  5/5 Wrist extension            5/5                  5/5 Wrist flexion                 5/5                  5/5 Elbow flexion                5/5                  5/5 Deltoid                          5/5                  5/5   -Sensory exam                           Sensation intact to light touch in C5-T1 nerve distributions of bilateral upper extremities   -Positive Spurling on the left, negative on the right -Negative hoffman bilaterally -Negative grip and release test -No interosseous muscle atrophy seen  Imaging: XRs of the cervical spine from 09/06/2023 were independently reviewed and interpreted, showing spondylolisthesis at C3/4 that shifts about 1.75mm between flexion and extension views. Disc height loss with anterior  osteophyte formation at C5/6. No fracture or dislocation seen.    MRI of the cervical spine from 04/13/2023 was independently reviewed and interpreted, showing bilateral (left>right) foraminal stenosis at C3/4.  Disc bulge at C5/6 with effacement of the ventral thecal sac but no significant stenosis seen.  No T2 cord signal change seen.  Left C3/4 edema and effusion within the facet joint.     Patient name: Michael [name] Patient MRN: [id number] Date of visit: 09/06/23

## 2023-12-10 ENCOUNTER — Ambulatory Visit: Payer: BC Managed Care – PPO | Admitting: Orthopedic Surgery

## 2023-12-23 ENCOUNTER — Ambulatory Visit (INDEPENDENT_AMBULATORY_CARE_PROVIDER_SITE_OTHER): Payer: BC Managed Care – PPO | Admitting: Orthopedic Surgery

## 2023-12-23 ENCOUNTER — Encounter: Payer: Self-pay | Admitting: Orthopedic Surgery

## 2023-12-23 ENCOUNTER — Other Ambulatory Visit (INDEPENDENT_AMBULATORY_CARE_PROVIDER_SITE_OTHER): Payer: BC Managed Care – PPO

## 2023-12-23 DIAGNOSIS — M533 Sacrococcygeal disorders, not elsewhere classified: Secondary | ICD-10-CM

## 2023-12-23 DIAGNOSIS — M25562 Pain in left knee: Secondary | ICD-10-CM | POA: Diagnosis not present

## 2023-12-23 MED ORDER — METHYLPREDNISOLONE 4 MG PO TBPK: ORAL_TABLET | ORAL | 0 refills | Status: DC

## 2023-12-23 MED ORDER — LIDOCAINE HCL 2 % EX GEL: 1.0000 | CUTANEOUS | 0 refills | Status: DC | PRN
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
## 2023-12-24 ENCOUNTER — Encounter: Payer: Self-pay | Admitting: Orthopedic Surgery

## 2023-12-26 ENCOUNTER — Ambulatory Visit: Payer: BC Managed Care – PPO | Admitting: Orthopedic Surgery

## 2023-12-27 ENCOUNTER — Ambulatory Visit
Admission: RE | Admit: 2023-12-27 | Discharge: 2023-12-27 | Disposition: A | Discharge status: Home / Self Care | Payer: BC Managed Care – PPO | Source: Ambulatory Visit | Attending: Orthopedic Surgery | Admitting: Orthopedic Surgery

## 2023-12-27 DIAGNOSIS — M533 Sacrococcygeal disorders, not elsewhere classified: Secondary | ICD-10-CM

## 2023-12-30 ENCOUNTER — Ambulatory Visit: Payer: BC Managed Care – PPO | Admitting: Orthopedic Surgery

## 2024-01-01 ENCOUNTER — Ambulatory Visit: Payer: Self-pay | Admitting: Orthopedic Surgery

## 2024-01-01 ENCOUNTER — Encounter: Payer: Self-pay | Admitting: Orthopedic Surgery

## 2024-01-01 ENCOUNTER — Telehealth: Payer: Self-pay | Admitting: Orthopedic Surgery

## 2024-01-01 ENCOUNTER — Ambulatory Visit: Payer: BC Managed Care – PPO | Admitting: Orthopedic Surgery
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
## 2024-01-09 ENCOUNTER — Ambulatory Visit: Payer: BC Managed Care – PPO | Admitting: Orthopedic Surgery

## 2024-01-09 ENCOUNTER — Ambulatory Visit (INDEPENDENT_AMBULATORY_CARE_PROVIDER_SITE_OTHER): Payer: BC Managed Care – PPO | Admitting: Orthopedic Surgery

## 2024-01-09 ENCOUNTER — Other Ambulatory Visit (INDEPENDENT_AMBULATORY_CARE_PROVIDER_SITE_OTHER): Payer: Self-pay

## 2024-01-09 DIAGNOSIS — R0781 Pleurodynia: Secondary | ICD-10-CM
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
## 2024-01-15 ENCOUNTER — Ambulatory Visit: Payer: BC Managed Care – PPO | Admitting: Orthopedic Surgery

## 2024-01-20 ENCOUNTER — Ambulatory Visit: Payer: BC Managed Care – PPO | Admitting: Orthopedic Surgery

## 2024-01-22 ENCOUNTER — Other Ambulatory Visit: Payer: Self-pay | Admitting: Orthopedic Surgery

## 2024-02-06 ENCOUNTER — Ambulatory Visit: Payer: BC Managed Care – PPO | Admitting: Orthopedic Surgery

## 2024-02-06 DIAGNOSIS — M533 Sacrococcygeal disorders, not elsewhere classified: Secondary | ICD-10-CM
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
## 2024-02-19 ENCOUNTER — Other Ambulatory Visit: Payer: Self-pay

## 2024-02-19 ENCOUNTER — Ambulatory Visit (INDEPENDENT_AMBULATORY_CARE_PROVIDER_SITE_OTHER): Admitting: Physical Medicine and Rehabilitation

## 2024-02-19 VITALS — BP 125/78 | HR 83

## 2024-02-19 DIAGNOSIS — M533 Sacrococcygeal disorders, not elsewhere classified: Secondary | ICD-10-CM

## 2024-02-19 NOTE — Progress Notes (Unsigned)
 Pain Scale   Average Pain 6        +Driver, -BT, -Dye Allergies.

## 2024-02-19 NOTE — Progress Notes (Unsigned)
 Michael [name] - 58 y.o. male MRN [id number]  Date of birth: [birth year]  Office Visit Note: Visit Date: 02/19/2024 PCP: Ermalinda Memos, MD Referred by: London Sheer, MD  Subjective: Chief Complaint  Patient presents with   Lower Back - Tailbone Pain   HPI:  Michael [name] is a 58 y.o. male who comes in today at the request of Dr. Willia Craze for planned diagnostic and therapeutic coccyx C1-2 with fluoroscopic guidance.  The patient has failed conservative care including home exercise, medications, time and activity modification.  This injection will be diagnostic and hopefully therapeutic.  Please see requesting physician notes for further details and justification.    ROS Otherwise per HPI.  Assessment & Plan: Visit Diagnoses:    ICD-10-CM   1. Coccydynia  M53.3 Coccyx Inj w/ Fluoroscopy    XR C-ARM NO REPORT      Plan: No additional findings.   Meds & Orders: No orders of the defined types were placed in this encounter.   Orders Placed This Encounter  Procedures   Coccyx Inj w/ Fluoroscopy   XR C-ARM NO REPORT    Follow-up: Return for visit to requesting provider as needed.   Procedures: Coccyx Inj w/ Fluoroscopy on 02/19/2024 3:04 PM Indications: diagnostic evaluation and pain Details: 22 G 3.5 in needle, fluoroscopy-guided posterior approach  Arthrogram: No  Medications: 4 mL bupivacaine 0.25 %; 40 mg triamcinolone acetonide 40 MG/ML Outcome: tolerated well, no immediate complications  Excellent flow of contrast outlining the C1-2 coccyx vestigial disc. No vascular or bowel flow pattern. Procedure, treatment alternatives, risks and benefits explained, specific risks discussed. Consent was given by the patient. Immediately prior to procedure a time out was called to verify the correct patient, procedure, equipment, support staff and site/side marked as required. Patient was prepped and draped in the usual sterile fashion.           Clinical History: MRI LUMBAR SPINE WITHOUT AND WITH CONTRAST   TECHNIQUE: Multiplanar and multiecho pulse sequences of the lumbar spine were obtained without and with intravenous contrast.   CONTRAST:  19mL MULTIHANCE GADOBENATE DIMEGLUMINE 529 MG/ML IV SOLN   COMPARISON:  MRI 01/07/2020   FINDINGS: Segmentation:  Standard.   Alignment:  Physiologic.   Vertebrae: No fracture, evidence of discitis, or bone lesion. Prior posterior and interbody fusion at L4-5 and L5-S1.   Conus medullaris and cauda equina: Conus extends to the L1 level. Conus and cauda equina appear normal.   Paraspinal and other soft tissues: No acute findings. Scattered cutaneous and subcutaneous cysts, unchanged.   Disc levels:   T12-L1: No disc protrusion. Mild bilateral facet arthropathy. No foraminal or canal stenosis. Unchanged.   L1-L2: No disc protrusion. Mild bilateral facet arthropathy. No foraminal or canal stenosis. Unchanged.   L2-L3: No disc protrusion. Mild bilateral facet arthropathy. No foraminal or canal stenosis. Unchanged.   L3-L4: Minimal annular disc bulge. Moderate bilateral facet arthropathy and ligamentum flavum buckling. Borderline-mild right foraminal stenosis, slightly progressed. No canal stenosis.   L4-L5: Prior decompression and fusion. Canal and foramina remain widely patent.   L5-S1: Prior decompression and fusion. Canal and foramina remain widely patent.   IMPRESSION: 1. Prior posterior and interbody fusion at L4-5 and L5-S1 without residual stenosis. 2. Slight progression of degenerative changes at L3-L4 with borderline-mild right foraminal stenosis. 3. No canal stenosis at any level.     Electronically Signed   By: Duanne Guess D.O.   On: 02/28/2022 13:08  Objective:  VS:  HT:    WT:   BMI:     BP:125/78  HR:83bpm  TEMP: ( )  RESP:  Physical Exam Vitals and nursing note reviewed.  Constitutional:      General: He is not in acute  distress.    Appearance: Normal appearance. He is not ill-appearing.  HENT:     Head: Normocephalic and atraumatic.     Right Ear: External ear normal.     Left Ear: External ear normal.     Nose: No congestion.  Eyes:     Extraocular Movements: Extraocular movements intact.  Cardiovascular:     Rate and Rhythm: Normal rate.     Pulses: Normal pulses.  Pulmonary:     Effort: Pulmonary effort is normal. No respiratory distress.  Abdominal:     General: There is no distension.     Palpations: Abdomen is soft.  Musculoskeletal:        General: No tenderness or signs of injury.     Cervical back: Neck supple.     Right lower leg: No edema.     Left lower leg: No edema.     Comments: Patient has good distal strength without clonus.  Skin:    Findings: No erythema or rash.  Neurological:     General: No focal deficit present.     Mental Status: He is alert and oriented to person, place, and time.     Sensory: No sensory deficit.     Motor: No weakness or abnormal muscle tone.     Coordination: Coordination normal.  Psychiatric:        Mood and Affect: Mood normal.        Behavior: Behavior normal.      Imaging: No results found.

## 2024-02-21 MED ORDER — TRIAMCINOLONE ACETONIDE 40 MG/ML IJ SUSP
40.0000 mg | INTRAMUSCULAR | Status: AC | PRN
  Administered 2024-02-19: 40 mg via INTRA_ARTICULAR

## 2024-02-21 MED ORDER — BUPIVACAINE HCL 0.25 % IJ SOLN
4.0000 mL | INTRAMUSCULAR | Status: AC | PRN
  Administered 2024-02-19: 4 mL via INTRA_ARTICULAR

## 2024-03-19 ENCOUNTER — Ambulatory Visit: Admitting: Orthopedic Surgery

## 2024-03-19 DIAGNOSIS — M5416 Radiculopathy, lumbar region: Secondary | ICD-10-CM

## 2024-03-19 MED ORDER — METHOCARBAMOL 500 MG PO TABS: 500.0000 mg | ORAL_TABLET | Freq: Four times a day (QID) | ORAL | 0 refills | Status: DC | PRN
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
## 2024-04-09 ENCOUNTER — Encounter: Admitting: Physical Medicine and Rehabilitation

## 2024-04-13 ENCOUNTER — Ambulatory Visit: Admitting: Physical Medicine and Rehabilitation

## 2024-04-13 ENCOUNTER — Other Ambulatory Visit: Payer: Self-pay

## 2024-04-13 ENCOUNTER — Encounter: Payer: Self-pay | Admitting: Physical Medicine and Rehabilitation

## 2024-04-13 VITALS — BP 120/78 | HR 80

## 2024-04-13 DIAGNOSIS — M5416 Radiculopathy, lumbar region: Secondary | ICD-10-CM

## 2024-04-13 MED ORDER — METHYLPREDNISOLONE ACETATE 40 MG/ML IJ SUSP
40.0000 mg | Freq: Once | INTRAMUSCULAR | Status: AC
  Administered 2024-04-13: 40 mg

## 2024-04-13 NOTE — Progress Notes (Unsigned)
 Pain Scale   Average Pain 6  Back hurts to walk, sit, stand, or lay down.  Back pain radiates into his right leg.  Numbness/tingling in the right leg. Hydrocodone     +Driver, -BT, -Dye Allergies.

## 2024-04-13 NOTE — Patient Instructions (Signed)

## 2024-04-21 NOTE — Procedures (Signed)
 Lumbosacral Transforaminal Epidural Steroid Injection - Sub-Pedicular Approach with Fluoroscopic Guidance  Patient: Michael [name]      Date of Birth: Jun 01, 1966 MRN: [id number] PCP: Wadell Guild, MD      Visit Date: 04/13/2024   Universal Protocol:    Date/Time: 04/13/2024  Consent Given By: the patient  Position: PRONE  Additional Comments: Vital signs were monitored before and after the procedure. Patient was prepped and draped in the usual sterile fashion. The correct patient, procedure, and site was verified.   Injection Procedure Details:   Procedure diagnoses: Radiculopathy, lumbar region [M54.16]    Meds Administered:  Meds ordered this encounter  Medications   methylPREDNISolone  acetate (DEPO-MEDROL ) injection 40 mg    Laterality: Right  Location/Site: L5 and S1  Needle:5.0 in., 22 ga.  Short bevel or Quincke spinal needle  Needle Placement: Transforaminal  Findings:    -Comments: Excellent flow of contrast along the nerve, nerve root and into the epidural space.  Procedure Details: After squaring off the end-plates to get a true AP view, the C-arm was positioned so that an oblique view of the foramen as noted above was visualized. The target area is just inferior to the "nose of the scotty dog" or sub pedicular. The soft tissues overlying this structure were infiltrated with 2-3 ml. of 1% Lidocaine  without Epinephrine .  The spinal needle was inserted toward the target using a "trajectory" view along the fluoroscope beam.  Under AP and lateral visualization, the needle was advanced so it did not puncture dura and was located close the 6 O'Clock position of the pedical in AP tracterory. Biplanar projections were used to confirm position. Aspiration was confirmed to be negative for CSF and/or blood. A 1-2 ml. volume of Isovue-250 was injected and flow of contrast was noted at each level. Radiographs were obtained for documentation purposes.   After  attaining the desired flow of contrast documented above, a 0.5 to 1.0 ml test dose of 0.25% Marcaine  was injected into each respective transforaminal space.  The patient was observed for 90 seconds post injection.  After no sensory deficits were reported, and normal lower extremity motor function was noted,   the above injectate was administered so that equal amounts of the injectate were placed at each foramen (level) into the transforaminal epidural space.   Additional Comments:  The patient tolerated the procedure well Dressing: 2 x 2 sterile gauze and Band-Aid    Post-procedure details: Patient was observed during the procedure. Post-procedure instructions were reviewed.  Patient left the clinic in stable condition.

## 2024-04-21 NOTE — Progress Notes (Signed)
 Michael [name] - 58 y.o. male MRN [id number]  Date of birth: 03/30/1966  Office Visit Note: Visit Date: 04/13/2024 PCP: Wadell Guild, MD Referred by: Diedra Fowler, MD  Subjective: Chief Complaint  Patient presents with   Lower Back - Pain   HPI:  Michael [name] is a 58 y.o. male who comes in today at the request of Dr. Colette Davies for planned Right L5-S1 and S1-2 Lumbar Transforaminal epidural steroid injection with fluoroscopic guidance.  The patient has failed conservative care including home exercise, medications, time and activity modification.  This injection will be diagnostic and hopefully therapeutic.  Please see requesting physician notes for further details and justification.   ROS Otherwise per HPI.  Assessment & Plan: Visit Diagnoses:    ICD-10-CM   1. Radiculopathy, lumbar region  M54.16 XR C-ARM NO REPORT    Epidural Steroid injection    methylPREDNISolone  acetate (DEPO-MEDROL ) injection 40 mg      Plan: No additional findings.   Meds & Orders:  Meds ordered this encounter  Medications   methylPREDNISolone  acetate (DEPO-MEDROL ) injection 40 mg    Orders Placed This Encounter  Procedures   XR C-ARM NO REPORT   Epidural Steroid injection    Follow-up: Return for visit to requesting provider as needed.   Procedures: No procedures performed  Lumbosacral Transforaminal Epidural Steroid Injection - Sub-Pedicular Approach with Fluoroscopic Guidance  Patient: Michael [name]      Date of Birth: 07-17-1966 MRN: [id number] PCP: Wadell Guild, MD      Visit Date: 04/13/2024   Universal Protocol:    Date/Time: 04/13/2024  Consent Given By: the patient  Position: PRONE  Additional Comments: Vital signs were monitored before and after the procedure. Patient was prepped and draped in the usual sterile fashion. The correct patient, procedure, and site was verified.   Injection Procedure Details:   Procedure  diagnoses: Radiculopathy, lumbar region [M54.16]    Meds Administered:  Meds ordered this encounter  Medications   methylPREDNISolone  acetate (DEPO-MEDROL ) injection 40 mg    Laterality: Right  Location/Site: L5 and S1  Needle:5.0 in., 22 ga.  Short bevel or Quincke spinal needle  Needle Placement: Transforaminal  Findings:    -Comments: Excellent flow of contrast along the nerve, nerve root and into the epidural space.  Procedure Details: After squaring off the end-plates to get a true AP view, the C-arm was positioned so that an oblique view of the foramen as noted above was visualized. The target area is just inferior to the "nose of the scotty dog" or sub pedicular. The soft tissues overlying this structure were infiltrated with 2-3 ml. of 1% Lidocaine  without Epinephrine .  The spinal needle was inserted toward the target using a "trajectory" view along the fluoroscope beam.  Under AP and lateral visualization, the needle was advanced so it did not puncture dura and was located close the 6 O'Clock position of the pedical in AP tracterory. Biplanar projections were used to confirm position. Aspiration was confirmed to be negative for CSF and/or blood. A 1-2 ml. volume of Isovue-250 was injected and flow of contrast was noted at each level. Radiographs were obtained for documentation purposes.   After attaining the desired flow of contrast documented above, a 0.5 to 1.0 ml test dose of 0.25% Marcaine  was injected into each respective transforaminal space.  The patient was observed for 90 seconds post injection.  After no sensory deficits were reported, and normal lower extremity motor function  was noted,   the above injectate was administered so that equal amounts of the injectate were placed at each foramen (level) into the transforaminal epidural space.   Additional Comments:  The patient tolerated the procedure well Dressing: 2 x 2 sterile gauze and Band-Aid    Post-procedure  details: Patient was observed during the procedure. Post-procedure instructions were reviewed.  Patient left the clinic in stable condition.    Clinical History: MRI LUMBAR SPINE WITHOUT AND WITH CONTRAST   TECHNIQUE: Multiplanar and multiecho pulse sequences of the lumbar spine were obtained without and with intravenous contrast.   CONTRAST:  19mL MULTIHANCE  GADOBENATE DIMEGLUMINE  529 MG/ML IV SOLN   COMPARISON:  MRI 01/07/2020   FINDINGS: Segmentation:  Standard.   Alignment:  Physiologic.   Vertebrae: No fracture, evidence of discitis, or bone lesion. Prior posterior and interbody fusion at L4-5 and L5-S1.   Conus medullaris and cauda equina: Conus extends to the L1 level. Conus and cauda equina appear normal.   Paraspinal and other soft tissues: No acute findings. Scattered cutaneous and subcutaneous cysts, unchanged.   Disc levels:   T12-L1: No disc protrusion. Mild bilateral facet arthropathy. No foraminal or canal stenosis. Unchanged.   L1-L2: No disc protrusion. Mild bilateral facet arthropathy. No foraminal or canal stenosis. Unchanged.   L2-L3: No disc protrusion. Mild bilateral facet arthropathy. No foraminal or canal stenosis. Unchanged.   L3-L4: Minimal annular disc bulge. Moderate bilateral facet arthropathy and ligamentum flavum buckling. Borderline-mild right foraminal stenosis, slightly progressed. No canal stenosis.   L4-L5: Prior decompression and fusion. Canal and foramina remain widely patent.   L5-S1: Prior decompression and fusion. Canal and foramina remain widely patent.   IMPRESSION: 1. Prior posterior and interbody fusion at L4-5 and L5-S1 without residual stenosis. 2. Slight progression of degenerative changes at L3-L4 with borderline-mild right foraminal stenosis. 3. No canal stenosis at any level.     Electronically Signed   By: Leverne Reading D.O.   On: 02/28/2022 13:08     Objective:  VS:  HT:    WT:   BMI:      BP:120/78  HR:80bpm  TEMP: ( )  RESP:  Physical Exam Vitals and nursing note reviewed.  Constitutional:      General: He is not in acute distress.    Appearance: Normal appearance. He is not ill-appearing.  HENT:     Head: Normocephalic and atraumatic.     Right Ear: External ear normal.     Left Ear: External ear normal.     Nose: No congestion.  Eyes:     Extraocular Movements: Extraocular movements intact.  Cardiovascular:     Rate and Rhythm: Normal rate.     Pulses: Normal pulses.  Pulmonary:     Effort: Pulmonary effort is normal. No respiratory distress.  Abdominal:     General: There is no distension.     Palpations: Abdomen is soft.  Musculoskeletal:        General: No tenderness or signs of injury.     Cervical back: Neck supple.     Right lower leg: No edema.     Left lower leg: No edema.     Comments: Patient has good distal strength without clonus.  Skin:    Findings: No erythema or rash.  Neurological:     General: No focal deficit present.     Mental Status: He is alert and oriented to person, place, and time.     Sensory: No sensory deficit.  Motor: No weakness or abnormal muscle tone.     Coordination: Coordination normal.  Psychiatric:        Mood and Affect: Mood normal.        Behavior: Behavior normal.      Imaging: No results found.

## 2024-04-26 ENCOUNTER — Encounter: Payer: Self-pay | Admitting: Orthopedic Surgery

## 2024-04-26 DIAGNOSIS — M533 Sacrococcygeal disorders, not elsewhere classified: Secondary | ICD-10-CM

## 2024-05-18 ENCOUNTER — Ambulatory Visit: Admitting: Physical Medicine and Rehabilitation

## 2024-05-18 ENCOUNTER — Other Ambulatory Visit: Payer: Self-pay

## 2024-05-18 VITALS — BP 121/75 | HR 72

## 2024-05-18 DIAGNOSIS — M533 Sacrococcygeal disorders, not elsewhere classified: Secondary | ICD-10-CM | POA: Diagnosis not present

## 2024-05-18 MED ORDER — TRIAMCINOLONE ACETONIDE 40 MG/ML IJ SUSP
40.0000 mg | INTRAMUSCULAR | Status: AC | PRN
  Administered 2024-05-18: 40 mg via INTRA_ARTICULAR

## 2024-05-18 MED ORDER — BUPIVACAINE HCL 0.25 % IJ SOLN
4.0000 mL | INTRAMUSCULAR | Status: AC | PRN
  Administered 2024-05-18: 4 mL via INTRA_ARTICULAR

## 2024-05-18 NOTE — Progress Notes (Signed)
 Michael [name] - 58 y.o. male MRN [id number]  Date of birth: 1966/03/08  Office Visit Note: Visit Date: 05/18/2024 PCP: Denette Bateman, MD Referred by: Georgina Ozell LABOR, MD  Subjective: Chief Complaint  Patient presents with   Lower Back - Pain   HPI:  Michael [name] is a 58 y.o. male who comes in today at the request of Dr. Ozell Georgina for planned Midline anesthetic Coccyx C1-2 joint arthrogram with fluoroscopic guidance.  The patient has failed conservative care including home exercise, medications, time and activity modification.  This injection will be diagnostic and hopefully therapeutic.  Please see requesting physician notes for further details and justification.   Prior injection more than 50% relief.   ROS Otherwise per HPI.  Assessment & Plan: Visit Diagnoses:    ICD-10-CM   1. Coccydynia  M53.3 XR C-ARM NO REPORT    Coccyx Large Joint Inj      Plan: No additional findings.   Meds & Orders: No orders of the defined types were placed in this encounter.   Orders Placed This Encounter  Procedures   Coccyx Large Joint Inj   XR C-ARM NO REPORT    Follow-up: Return for visit to requesting provider as needed.   Procedures: Coccyx Large Joint Inj on 05/18/2024 8:21 AM Indications: diagnostic evaluation and pain Details: 22 G 3.5 in needle, fluoroscopy-guided posterior approach  Arthrogram: No  Medications: 4 mL bupivacaine  0.25 %; 40 mg triamcinolone  acetonide 40 MG/ML Outcome: tolerated well, no immediate complications Procedure, treatment alternatives, risks and benefits explained, specific risks discussed. Consent was given by the patient. Immediately prior to procedure a time out was called to verify the correct patient, procedure, equipment, support staff and site/side marked as required. Patient was prepped and draped in the usual sterile fashion.          Clinical History: MRI LUMBAR SPINE WITHOUT AND WITH CONTRAST    TECHNIQUE: Multiplanar and multiecho pulse sequences of the lumbar spine were obtained without and with intravenous contrast.   CONTRAST:  19mL MULTIHANCE  GADOBENATE DIMEGLUMINE  529 MG/ML IV SOLN   COMPARISON:  MRI 01/07/2020   FINDINGS: Segmentation:  Standard.   Alignment:  Physiologic.   Vertebrae: No fracture, evidence of discitis, or bone lesion. Prior posterior and interbody fusion at L4-5 and L5-S1.   Conus medullaris and cauda equina: Conus extends to the L1 level. Conus and cauda equina appear normal.   Paraspinal and other soft tissues: No acute findings. Scattered cutaneous and subcutaneous cysts, unchanged.   Disc levels:   T12-L1: No disc protrusion. Mild bilateral facet arthropathy. No foraminal or canal stenosis. Unchanged.   L1-L2: No disc protrusion. Mild bilateral facet arthropathy. No foraminal or canal stenosis. Unchanged.   L2-L3: No disc protrusion. Mild bilateral facet arthropathy. No foraminal or canal stenosis. Unchanged.   L3-L4: Minimal annular disc bulge. Moderate bilateral facet arthropathy and ligamentum flavum buckling. Borderline-mild right foraminal stenosis, slightly progressed. No canal stenosis.   L4-L5: Prior decompression and fusion. Canal and foramina remain widely patent.   L5-S1: Prior decompression and fusion. Canal and foramina remain widely patent.   IMPRESSION: 1. Prior posterior and interbody fusion at L4-5 and L5-S1 without residual stenosis. 2. Slight progression of degenerative changes at L3-L4 with borderline-mild right foraminal stenosis. 3. No canal stenosis at any level.     Electronically Signed   By: Mabel Converse D.O.   On: 02/28/2022 13:08     Objective:  VS:  HT:  WT:   BMI:     BP:121/75  HR:72bpm  TEMP: ( )  RESP:  Physical Exam   Imaging: No results found.

## 2024-05-18 NOTE — Progress Notes (Signed)
 Pain Scale   Average Pain 6 Patient advising he has chronic buttocks pain without relief        +Driver, -BT, -Dye Allergies.

## 2024-05-18 NOTE — Patient Instructions (Signed)

## 2024-07-06 ENCOUNTER — Encounter: Payer: Self-pay | Admitting: Orthopedic Surgery

## 2024-07-06 MED ORDER — METHOCARBAMOL 500 MG PO TABS: 500.0000 mg | ORAL_TABLET | Freq: Three times a day (TID) | ORAL | 0 refills | Status: DC | PRN

## 2024-08-03 ENCOUNTER — Encounter: Payer: Self-pay | Admitting: Physical Medicine and Rehabilitation

## 2024-08-10 ENCOUNTER — Telehealth: Payer: Self-pay | Admitting: Physical Medicine and Rehabilitation

## 2024-08-10 ENCOUNTER — Other Ambulatory Visit (INDEPENDENT_AMBULATORY_CARE_PROVIDER_SITE_OTHER): Payer: Self-pay

## 2024-08-10 ENCOUNTER — Other Ambulatory Visit: Payer: Self-pay | Admitting: Physical Medicine and Rehabilitation

## 2024-08-10 ENCOUNTER — Telehealth: Payer: Self-pay

## 2024-08-10 ENCOUNTER — Ambulatory Visit (INDEPENDENT_AMBULATORY_CARE_PROVIDER_SITE_OTHER): Admitting: Orthopedic Surgery

## 2024-08-10 ENCOUNTER — Ambulatory Visit: Admitting: Orthopedic Surgery

## 2024-08-10 DIAGNOSIS — M542 Cervicalgia: Secondary | ICD-10-CM

## 2024-08-10 DIAGNOSIS — M5416 Radiculopathy, lumbar region: Secondary | ICD-10-CM

## 2024-08-10 NOTE — Telephone Encounter (Signed)
Patient was here. Would like an appointment with Dr. Ernestina Patches

## 2024-08-10 NOTE — Telephone Encounter (Signed)
 Last injection 5/25 % 90 relief/function ability Duration of relief/improvement --3 months Current pain score--8 Recent falls or injuries---none Same pain and same location as last time

## 2024-08-10 NOTE — Progress Notes (Signed)
 Orthopedic Office Note  Patient comes in today to discuss his neck pain.  He said he has pain in his neck in the upper cervical region and has developed headaches in the temporal region.  He also has pain in the bilateral trapezius muscles.  He does not have any pain radiating further down his arms.  He is in pain management and is taking Norco 10 mg twice a day but said that is not cutting it anymore.  He has tried multiple conservative treatments but has not noticed any relief.  He is interested in if there are any other further conservative treatments.  On exam, he has 5 out of 5 strength in all upper extremity myotomes.  Sensation is intact to light touch in C5-T1 nerve distributions bilaterally.  Palpable radial pulses, hands warm and well-perfused.  He is in no acute distress.  He is breathing is unlabored on room air.  Imaging - XRs of the cervical spine from 08/10/2024 were independently reviewed and interpreted, showing spondylolisthesis at C3/4.  Disc at loss with anterior osteophyte formation at C5/6.  The spondylolisthesis shifts 2.8 mm between flexion-extension views.  It is reduced on the extension view.  No fracture or dislocation seen.  Patient is not currently an operative candidate as he continues to use nicotine .  He should continue with pain management.  Discussed facet injections as an additional nonoperative treatment that he could try.  He was interested in this treatment.  Referral provided to him today.  Patient should return to the office on an as needed basis.  Michael DELENA Ada, MD Orthopedic Surgeon  I spent 20 minutes in the room with the patient going over his symptoms, his exam, and his imaging.  We also discussed pain management and his options for other pain management groups in town.  We discussed some of the pain that may get better with an ESI versus facet injections.  I also spent some time explaining why surgery would not be a good option in someone with actively  using nicotine .

## 2024-08-16 ENCOUNTER — Encounter: Payer: Self-pay | Admitting: Orthopedic Surgery

## 2024-08-16 DIAGNOSIS — M542 Cervicalgia: Secondary | ICD-10-CM

## 2024-08-17 ENCOUNTER — Ambulatory Visit: Admitting: Orthopedic Surgery

## 2024-08-20 ENCOUNTER — Ambulatory Visit: Admitting: Physical Medicine and Rehabilitation

## 2024-08-20 ENCOUNTER — Encounter: Payer: Self-pay | Admitting: Physical Medicine and Rehabilitation

## 2024-08-20 DIAGNOSIS — M47812 Spondylosis without myelopathy or radiculopathy, cervical region: Secondary | ICD-10-CM | POA: Diagnosis not present

## 2024-08-20 DIAGNOSIS — G894 Chronic pain syndrome: Secondary | ICD-10-CM

## 2024-08-20 DIAGNOSIS — M542 Cervicalgia: Secondary | ICD-10-CM
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
## 2024-08-28 ENCOUNTER — Telehealth: Payer: Self-pay

## 2024-08-28 DIAGNOSIS — F411 Generalized anxiety disorder: Secondary | ICD-10-CM

## 2024-08-28 MED ORDER — DIAZEPAM 5 MG PO TABS: ORAL_TABLET | ORAL | 0 refills | Status: DC

## 2024-08-28 NOTE — Addendum Note (Signed)
 Addended by: ELDONNA GARDINER POUR on: 08/28/2024 10:53 AM   Modules accepted: Orders

## 2024-08-28 NOTE — Telephone Encounter (Signed)
 Per procedural valium  request. Injection 10/23 an 11/06

## 2024-09-03 ENCOUNTER — Ambulatory Visit: Admitting: Orthopedic Surgery

## 2024-09-06 ENCOUNTER — Other Ambulatory Visit: Payer: Self-pay | Admitting: Orthopedic Surgery

## 2024-09-17 ENCOUNTER — Ambulatory Visit: Admitting: Physical Medicine and Rehabilitation

## 2024-09-17 ENCOUNTER — Other Ambulatory Visit: Payer: Self-pay

## 2024-09-17 VITALS — BP 108/70 | HR 80

## 2024-09-17 DIAGNOSIS — M5416 Radiculopathy, lumbar region: Secondary | ICD-10-CM

## 2024-09-17 MED ORDER — METHYLPREDNISOLONE ACETATE 40 MG/ML IJ SUSP
40.0000 mg | Freq: Once | INTRAMUSCULAR | Status: AC
  Administered 2024-09-17: 40 mg

## 2024-09-17 NOTE — Progress Notes (Signed)
 Pain Scale   Average Pain 7 Patient advised he has lower back pain that is constant.        +Driver, -BT, -Dye Allergies.

## 2024-09-17 NOTE — Progress Notes (Signed)
 Michael [name] - 58 y.o. male MRN [id number]  Date of birth: Jun 01, 1966  Office Visit Note: Visit Date: 09/17/2024 PCP: Denette Bateman, MD Referred by: Georgina Ozell LABOR, MD  Subjective: Chief Complaint  Patient presents with   Lower Back - Pain   HPI:  Michael [name] is a 58 y.o. male who comes in today at the request of Dr. Ozell Georgina for planned Right L5-S1 and S1-2 Lumbar Transforaminal epidural steroid injection with fluoroscopic guidance.  The patient has failed conservative care including home exercise, medications, time and activity modification.  This injection will be diagnostic and hopefully therapeutic.  Please see requesting physician notes for further details and justification.   ROS Otherwise per HPI.  Assessment & Plan: Visit Diagnoses:    ICD-10-CM   1. Lumbar radiculopathy  M54.16 XR C-ARM NO REPORT    Epidural Steroid injection    methylPREDNISolone  acetate (DEPO-MEDROL ) injection 40 mg      Plan: No additional findings.   Meds & Orders:  Meds ordered this encounter  Medications   methylPREDNISolone  acetate (DEPO-MEDROL ) injection 40 mg    Orders Placed This Encounter  Procedures   XR C-ARM NO REPORT   Epidural Steroid injection    Follow-up: Return for visit to requesting provider as needed.   Procedures: No procedures performed  Lumbosacral Transforaminal Epidural Steroid Injection - Sub-Pedicular Approach with Fluoroscopic Guidance  Patient: Michael [name]      Date of Birth: May 15, 1966 MRN: [id number] PCP: Denette Bateman, MD      Visit Date: 09/17/2024   Universal Protocol:    Date/Time: 09/17/2024  Consent Given By: the patient  Position: PRONE  Additional Comments: Vital signs were monitored before and after the procedure. Patient was prepped and draped in the usual sterile fashion. The correct patient, procedure, and site was verified.   Injection Procedure Details:   Procedure diagnoses:  Lumbar radiculopathy [M54.16]    Meds Administered:  Meds ordered this encounter  Medications   methylPREDNISolone  acetate (DEPO-MEDROL ) injection 40 mg    Laterality: Right  Location/Site: L5 and S1  Needle:5.0 in., 22 ga.  Short bevel or Quincke spinal needle  Needle Placement: Transforaminal  Findings:    -Comments: Excellent flow of contrast along the nerve, nerve root and into the epidural space.  Procedure Details: After squaring off the end-plates to get a true AP view, the C-arm was positioned so that an oblique view of the foramen as noted above was visualized. The target area is just inferior to the nose of the scotty dog or sub pedicular. The soft tissues overlying this structure were infiltrated with 2-3 ml. of 1% Lidocaine  without Epinephrine .  The spinal needle was inserted toward the target using a trajectory view along the fluoroscope beam.  Under AP and lateral visualization, the needle was advanced so it did not puncture dura and was located close the 6 O'Clock position of the pedical in AP tracterory. Biplanar projections were used to confirm position. Aspiration was confirmed to be negative for CSF and/or blood. A 1-2 ml. volume of Isovue-250 was injected and flow of contrast was noted at each level. Radiographs were obtained for documentation purposes.   After attaining the desired flow of contrast documented above, a 0.5 to 1.0 ml test dose of 0.25% Marcaine  was injected into each respective transforaminal space.  The patient was observed for 90 seconds post injection.  After no sensory deficits were reported, and normal lower extremity motor function was noted,  the above injectate was administered so that equal amounts of the injectate were placed at each foramen (level) into the transforaminal epidural space.   Additional Comments:  The patient tolerated the procedure well Dressing: 2 x 2 sterile gauze and Band-Aid    Post-procedure details: Patient was  observed during the procedure. Post-procedure instructions were reviewed.  Patient left the clinic in stable condition.    Clinical History: MRI LUMBAR SPINE WITHOUT AND WITH CONTRAST   TECHNIQUE: Multiplanar and multiecho pulse sequences of the lumbar spine were obtained without and with intravenous contrast.   CONTRAST:  19mL MULTIHANCE  GADOBENATE DIMEGLUMINE  529 MG/ML IV SOLN   COMPARISON:  MRI 01/07/2020   FINDINGS: Segmentation:  Standard.   Alignment:  Physiologic.   Vertebrae: No fracture, evidence of discitis, or bone lesion. Prior posterior and interbody fusion at L4-5 and L5-S1.   Conus medullaris and cauda equina: Conus extends to the L1 level. Conus and cauda equina appear normal.   Paraspinal and other soft tissues: No acute findings. Scattered cutaneous and subcutaneous cysts, unchanged.   Disc levels:   T12-L1: No disc protrusion. Mild bilateral facet arthropathy. No foraminal or canal stenosis. Unchanged.   L1-L2: No disc protrusion. Mild bilateral facet arthropathy. No foraminal or canal stenosis. Unchanged.   L2-L3: No disc protrusion. Mild bilateral facet arthropathy. No foraminal or canal stenosis. Unchanged.   L3-L4: Minimal annular disc bulge. Moderate bilateral facet arthropathy and ligamentum flavum buckling. Borderline-mild right foraminal stenosis, slightly progressed. No canal stenosis.   L4-L5: Prior decompression and fusion. Canal and foramina remain widely patent.   L5-S1: Prior decompression and fusion. Canal and foramina remain widely patent.   IMPRESSION: 1. Prior posterior and interbody fusion at L4-5 and L5-S1 without residual stenosis. 2. Slight progression of degenerative changes at L3-L4 with borderline-mild right foraminal stenosis. 3. No canal stenosis at any level.     Electronically Signed   By: Mabel Converse D.O.   On: 02/28/2022 13:08     Objective:  VS:  HT:    WT:   BMI:     BP:108/70  HR:80bpm   TEMP: ( )  RESP:  Physical Exam Vitals and nursing note reviewed.  Constitutional:      General: He is not in acute distress.    Appearance: Normal appearance. He is not ill-appearing.  HENT:     Head: Normocephalic and atraumatic.     Right Ear: External ear normal.     Left Ear: External ear normal.     Nose: No congestion.  Eyes:     Extraocular Movements: Extraocular movements intact.  Cardiovascular:     Rate and Rhythm: Normal rate.     Pulses: Normal pulses.  Pulmonary:     Effort: Pulmonary effort is normal. No respiratory distress.  Abdominal:     General: There is no distension.     Palpations: Abdomen is soft.  Musculoskeletal:        General: No tenderness or signs of injury.     Cervical back: Neck supple.     Right lower leg: No edema.     Left lower leg: No edema.     Comments: Patient has good distal strength without clonus.  Skin:    Findings: No erythema or rash.  Neurological:     General: No focal deficit present.     Mental Status: He is alert and oriented to person, place, and time.     Sensory: No sensory deficit.     Motor:  No weakness or abnormal muscle tone.     Coordination: Coordination normal.  Psychiatric:        Mood and Affect: Mood normal.        Behavior: Behavior normal.      Imaging: No results found.

## 2024-09-21 NOTE — Procedures (Signed)
 Lumbosacral Transforaminal Epidural Steroid Injection - Sub-Pedicular Approach with Fluoroscopic Guidance  Patient: Michael [name]      Date of Birth: 08-16-1966 MRN: [id number] PCP: Denette Bateman, MD      Visit Date: 09/17/2024   Universal Protocol:    Date/Time: 09/17/2024  Consent Given By: the patient  Position: PRONE  Additional Comments: Vital signs were monitored before and after the procedure. Patient was prepped and draped in the usual sterile fashion. The correct patient, procedure, and site was verified.   Injection Procedure Details:   Procedure diagnoses: Lumbar radiculopathy [M54.16]    Meds Administered:  Meds ordered this encounter  Medications   methylPREDNISolone  acetate (DEPO-MEDROL ) injection 40 mg    Laterality: Right  Location/Site: L5 and S1  Needle:5.0 in., 22 ga.  Short bevel or Quincke spinal needle  Needle Placement: Transforaminal  Findings:    -Comments: Excellent flow of contrast along the nerve, nerve root and into the epidural space.  Procedure Details: After squaring off the end-plates to get a true AP view, the C-arm was positioned so that an oblique view of the foramen as noted above was visualized. The target area is just inferior to the nose of the scotty dog or sub pedicular. The soft tissues overlying this structure were infiltrated with 2-3 ml. of 1% Lidocaine  without Epinephrine .  The spinal needle was inserted toward the target using a trajectory view along the fluoroscope beam.  Under AP and lateral visualization, the needle was advanced so it did not puncture dura and was located close the 6 O'Clock position of the pedical in AP tracterory. Biplanar projections were used to confirm position. Aspiration was confirmed to be negative for CSF and/or blood. A 1-2 ml. volume of Isovue-250 was injected and flow of contrast was noted at each level. Radiographs were obtained for documentation purposes.   After attaining  the desired flow of contrast documented above, a 0.5 to 1.0 ml test dose of 0.25% Marcaine  was injected into each respective transforaminal space.  The patient was observed for 90 seconds post injection.  After no sensory deficits were reported, and normal lower extremity motor function was noted,   the above injectate was administered so that equal amounts of the injectate were placed at each foramen (level) into the transforaminal epidural space.   Additional Comments:  The patient tolerated the procedure well Dressing: 2 x 2 sterile gauze and Band-Aid    Post-procedure details: Patient was observed during the procedure. Post-procedure instructions were reviewed.  Patient left the clinic in stable condition.

## 2024-09-28 ENCOUNTER — Encounter: Payer: Self-pay | Admitting: Radiology

## 2024-09-29 ENCOUNTER — Encounter: Payer: Self-pay | Admitting: Orthopedic Surgery

## 2024-09-29 ENCOUNTER — Encounter: Payer: Self-pay | Admitting: Physical Medicine and Rehabilitation

## 2024-09-29 DIAGNOSIS — F411 Generalized anxiety disorder: Secondary | ICD-10-CM

## 2024-09-29 MED ORDER — METHOCARBAMOL 500 MG PO TABS: 500.0000 mg | ORAL_TABLET | Freq: Four times a day (QID) | ORAL | 2 refills | Status: DC | PRN

## 2024-09-30 MED ORDER — DIAZEPAM 5 MG PO TABS: ORAL_TABLET | ORAL | 0 refills | Status: DC

## 2024-10-01 ENCOUNTER — Other Ambulatory Visit: Payer: Self-pay

## 2024-10-01 ENCOUNTER — Ambulatory Visit (INDEPENDENT_AMBULATORY_CARE_PROVIDER_SITE_OTHER): Admitting: Physical Medicine and Rehabilitation

## 2024-10-01 VITALS — BP 114/69 | HR 73

## 2024-10-01 DIAGNOSIS — M47812 Spondylosis without myelopathy or radiculopathy, cervical region: Secondary | ICD-10-CM

## 2024-10-01 MED ORDER — BUPIVACAINE HCL 0.25 % IJ SOLN
2.0000 mL | Freq: Once | INTRAMUSCULAR | Status: AC
  Administered 2024-10-01: 2 mL

## 2024-10-01 NOTE — Progress Notes (Signed)
 Pain Scale   Average Pain 5 Patient advised he has neck  pain radiating to bilateral shoulders and pain is constant        +Driver, -BT, -Dye Allergies.

## 2024-10-02 ENCOUNTER — Encounter: Payer: Self-pay | Admitting: Physical Medicine and Rehabilitation

## 2024-10-02 ENCOUNTER — Other Ambulatory Visit: Payer: Self-pay | Admitting: Physical Medicine and Rehabilitation

## 2024-10-02 DIAGNOSIS — M47812 Spondylosis without myelopathy or radiculopathy, cervical region: Secondary | ICD-10-CM

## 2024-10-08 NOTE — Procedures (Signed)
 Diagnostic Cervical Facet Joint Nerve Block with Fluoroscopic Guidance  Patient: Michael [name]      Date of Birth: 06-17-1966 MRN: [id number] PCP: Denette Bateman, MD      Visit Date: 10/01/2024   Universal Protocol:    Date/Time: 10/08/2509:57 AM  Consent Given By: the patient  Position: PRONE  Additional Comments: Vital signs were monitored before and after the procedure. Patient was prepped and draped in the usual sterile fashion. The correct patient, procedure, and site was verified.   Injection Procedure Details:   Procedure diagnoses: Cervical spondylosis without myelopathy [M47.812]   Meds Administered:  Meds ordered this encounter  Medications   bupivacaine  (MARCAINE ) 0.25 % (with pres) injection 2 mL     Laterality: Bilateral  Location/Site:  C3-4 C4-5  Needle size: 25 G  Needle type: Spinal  Needle Placement: Articular Pillar  Findings:  -Contrast Used: 0.5 mL iohexol 180 mg iodine/mL   -Comments: Excellent flow of contrast across the articular pillars without intravascular flow  Procedure Details: The fluoroscope beam was positioned to square off the endplates of the desired vertebral level to achieve a true AP position. The beam was then moved in a small "counter" oblique to the contralateral side with a small amount of caudal tilt to achieve a trajectory alignment with the desired nerves.  For each target described below the skin was anesthetized with 1 ml of 1% Lidocaine  without epinephrine .   To block the facet joint nerve to C2, the needle was fluoroscopically positioned over the inferior lateral portion of the C2/3 facet joint nerve where the third occipital nerve (TON) lies.  To block the facet joint nerves from C3 through C7, the lateral masses of these respective levels were localized under fluoroscopic visualization.  A spinal needle was inserted down to the waist at the above mentioned cervical levels.  The  needle was then walked  off until it rested just lateral to the trough of the lateral mass of the medial branch nerve, which innervates the cervical facet joint.  To block the C8 facet joint nerve, the needle was fluoroscopically introduced onto the Tl transverse process at its most medial superior end.  After contact with periosteum and negative aspirate for blood and CSF, correct placement without intravascular or epidural spread was confirmed by Bi-planar images and  injecting 0.5 ml. of Omnipaque-240.  A spot radiograph was obtained of this image.  Next, a 0.5 ml. volume of 1% Lidocaine  without Epinephrine  was then injected.  Prior to the procedure, the patient was given a Pain Diary which was completed for baseline measurements.  After the procedure, the patient rated their pain every 30 minutes and will continue rating at this frequency for a total of 5 hours.  The patient has been asked to complete the Diary and return to us  by mail, fax or hand delivered as soon as possible.   Additional Comments:  No complications occurred Dressing: Band-Aid    Post-procedure details: Patient was observed during the procedure. Post-procedure instructions were reviewed.  Patient left the clinic in stable condition.

## 2024-10-08 NOTE — Progress Notes (Signed)
 Michael [name] - 58 y.o. male MRN [id number]  Date of birth: 1966/08/20  Office Visit Note: Visit Date: 10/01/2024 PCP: Denette Bateman, MD Referred by: Dowlen, Hugh, MD  Subjective: Chief Complaint  Patient presents with   Neck - Pain   HPI:  Michael [name] is a 58 y.o. male who comes in today at the request of Dr. Ozell Ada for planned Bilateral  C3-4 and C4-5 Cervical facet/medial branch block with fluoroscopic guidance.  The patient has failed conservative care including home exercise, medications, time and activity modification.  This injection will be diagnostic and hopefully therapeutic.  Please see requesting physician notes for further details and justification.  Exam has shown concordant pain with facet joint loading.   ROS Otherwise per HPI.  Assessment & Plan: Visit Diagnoses:    ICD-10-CM   1. Cervical spondylosis without myelopathy  M47.812 XR C-ARM NO REPORT    Nerve Block    bupivacaine  (MARCAINE ) 0.25 % (with pres) injection 2 mL      Plan: No additional findings.   Meds & Orders:  Meds ordered this encounter  Medications   bupivacaine  (MARCAINE ) 0.25 % (with pres) injection 2 mL    Orders Placed This Encounter  Procedures   Nerve Block   XR C-ARM NO REPORT    Follow-up: Return for Review Pain Diary.   Procedures: No procedures performed  Diagnostic Cervical Facet Joint Nerve Block with Fluoroscopic Guidance  Patient: Michael [name]      Date of Birth: 21-Jul-1966 MRN: [id number] PCP: Denette Bateman, MD      Visit Date: 10/01/2024   Universal Protocol:    Date/Time: 10/08/2509:57 AM  Consent Given By: the patient  Position: PRONE  Additional Comments: Vital signs were monitored before and after the procedure. Patient was prepped and draped in the usual sterile fashion. The correct patient, procedure, and site was verified.   Injection Procedure Details:   Procedure diagnoses: Cervical spondylosis  without myelopathy [M47.812]   Meds Administered:  Meds ordered this encounter  Medications   bupivacaine  (MARCAINE ) 0.25 % (with pres) injection 2 mL     Laterality: Bilateral  Location/Site:  C3-4 C4-5  Needle size: 25 G  Needle type: Spinal  Needle Placement: Articular Pillar  Findings:  -Contrast Used: 0.5 mL iohexol 180 mg iodine/mL   -Comments: Excellent flow of contrast across the articular pillars without intravascular flow  Procedure Details: The fluoroscope beam was positioned to square off the endplates of the desired vertebral level to achieve a true AP position. The beam was then moved in a small "counter" oblique to the contralateral side with a small amount of caudal tilt to achieve a trajectory alignment with the desired nerves.  For each target described below the skin was anesthetized with 1 ml of 1% Lidocaine  without epinephrine .   To block the facet joint nerve to C2, the needle was fluoroscopically positioned over the inferior lateral portion of the C2/3 facet joint nerve where the third occipital nerve (TON) lies.  To block the facet joint nerves from C3 through C7, the lateral masses of these respective levels were localized under fluoroscopic visualization.  A spinal needle was inserted down to the waist at the above mentioned cervical levels.  The  needle was then walked off until it rested just lateral to the trough of the lateral mass of the medial branch nerve, which innervates the cervical facet joint.  To block the C8 facet joint nerve, the needle  was fluoroscopically introduced onto the Tl transverse process at its most medial superior end.  After contact with periosteum and negative aspirate for blood and CSF, correct placement without intravascular or epidural spread was confirmed by Bi-planar images and  injecting 0.5 ml. of Omnipaque-240.  A spot radiograph was obtained of this image.  Next, a 0.5 ml. volume of 1% Lidocaine  without Epinephrine   was then injected.  Prior to the procedure, the patient was given a Pain Diary which was completed for baseline measurements.  After the procedure, the patient rated their pain every 30 minutes and will continue rating at this frequency for a total of 5 hours.  The patient has been asked to complete the Diary and return to us  by mail, fax or hand delivered as soon as possible.   Additional Comments:  No complications occurred Dressing: Band-Aid    Post-procedure details: Patient was observed during the procedure. Post-procedure instructions were reviewed.  Patient left the clinic in stable condition.       Clinical History: MRI LUMBAR SPINE WITHOUT AND WITH CONTRAST   TECHNIQUE: Multiplanar and multiecho pulse sequences of the lumbar spine were obtained without and with intravenous contrast.   CONTRAST:  19mL MULTIHANCE  GADOBENATE DIMEGLUMINE  529 MG/ML IV SOLN   COMPARISON:  MRI 01/07/2020   FINDINGS: Segmentation:  Standard.   Alignment:  Physiologic.   Vertebrae: No fracture, evidence of discitis, or bone lesion. Prior posterior and interbody fusion at L4-5 and L5-S1.   Conus medullaris and cauda equina: Conus extends to the L1 level. Conus and cauda equina appear normal.   Paraspinal and other soft tissues: No acute findings. Scattered cutaneous and subcutaneous cysts, unchanged.   Disc levels:   T12-L1: No disc protrusion. Mild bilateral facet arthropathy. No foraminal or canal stenosis. Unchanged.   L1-L2: No disc protrusion. Mild bilateral facet arthropathy. No foraminal or canal stenosis. Unchanged.   L2-L3: No disc protrusion. Mild bilateral facet arthropathy. No foraminal or canal stenosis. Unchanged.   L3-L4: Minimal annular disc bulge. Moderate bilateral facet arthropathy and ligamentum flavum buckling. Borderline-mild right foraminal stenosis, slightly progressed. No canal stenosis.   L4-L5: Prior decompression and fusion. Canal and foramina  remain widely patent.   L5-S1: Prior decompression and fusion. Canal and foramina remain widely patent.   IMPRESSION: 1. Prior posterior and interbody fusion at L4-5 and L5-S1 without residual stenosis. 2. Slight progression of degenerative changes at L3-L4 with borderline-mild right foraminal stenosis. 3. No canal stenosis at any level.     Electronically Signed   By: Mabel Converse D.O.   On: 02/28/2022 13:08     Objective:  VS:  HT:    WT:   BMI:     BP:114/69  HR:73bpm  TEMP: ( )  RESP:  Physical Exam Vitals and nursing note reviewed.  Constitutional:      General: He is not in acute distress.    Appearance: Normal appearance. He is not ill-appearing.  HENT:     Head: Normocephalic and atraumatic.     Right Ear: External ear normal.     Left Ear: External ear normal.  Eyes:     Extraocular Movements: Extraocular movements intact.  Cardiovascular:     Rate and Rhythm: Normal rate.     Pulses: Normal pulses.  Abdominal:     General: There is no distension.     Palpations: Abdomen is soft.  Musculoskeletal:        General: No signs of injury.     Cervical  back: Neck supple. Tenderness present. No rigidity.     Right lower leg: No edema.     Left lower leg: No edema.     Comments: Patient has good strength in the upper extremities with 5 out of 5 strength in wrist extension long finger flexion APB.  No intrinsic hand muscle atrophy.  Negative Hoffmann's test.  Lymphadenopathy:     Cervical: No cervical adenopathy.  Skin:    Findings: No erythema or rash.  Neurological:     General: No focal deficit present.     Mental Status: He is alert and oriented to person, place, and time.     Sensory: No sensory deficit.     Motor: No weakness or abnormal muscle tone.     Coordination: Coordination normal.  Psychiatric:        Mood and Affect: Mood normal.        Behavior: Behavior normal.      Imaging: No results found.

## 2024-10-14 ENCOUNTER — Telehealth: Payer: Self-pay

## 2024-10-14 ENCOUNTER — Other Ambulatory Visit: Payer: Self-pay | Admitting: Physical Medicine and Rehabilitation

## 2024-10-14 MED ORDER — DIAZEPAM 5 MG PO TABS: ORAL_TABLET | ORAL | 0 refills | Status: DC

## 2024-10-14 NOTE — Telephone Encounter (Signed)
 Per procedural valium  request.

## 2024-11-09 ENCOUNTER — Other Ambulatory Visit: Payer: Self-pay

## 2024-11-09 ENCOUNTER — Ambulatory Visit: Admitting: Physical Medicine and Rehabilitation

## 2024-11-09 VITALS — BP 122/74 | HR 71

## 2024-11-09 DIAGNOSIS — M47812 Spondylosis without myelopathy or radiculopathy, cervical region: Secondary | ICD-10-CM

## 2024-11-09 MED ORDER — BUPIVACAINE HCL 0.5 % IJ SOLN
3.0000 mL | Freq: Once | INTRAMUSCULAR | Status: AC
  Administered 2024-11-09: 3 mL

## 2024-11-09 NOTE — Progress Notes (Unsigned)
 Pain Scale   Average Pain 4 Patient advising he has chronic neck pain that radiates to left side, patient advising his pain is constant and bothers him in am and all day .        +Driver, -BT, -Dye Allergies.

## 2024-11-11 ENCOUNTER — Encounter: Payer: Self-pay | Admitting: Physical Medicine and Rehabilitation

## 2024-11-12 ENCOUNTER — Other Ambulatory Visit: Payer: Self-pay

## 2024-11-12 ENCOUNTER — Other Ambulatory Visit: Payer: Self-pay | Admitting: Physical Medicine and Rehabilitation

## 2024-11-12 ENCOUNTER — Ambulatory Visit: Admitting: Orthopedic Surgery

## 2024-11-12 DIAGNOSIS — M47812 Spondylosis without myelopathy or radiculopathy, cervical region: Secondary | ICD-10-CM

## 2024-11-12 DIAGNOSIS — M5416 Radiculopathy, lumbar region: Secondary | ICD-10-CM | POA: Diagnosis not present

## 2024-11-12 DIAGNOSIS — M545 Low back pain, unspecified: Secondary | ICD-10-CM

## 2024-11-12 DIAGNOSIS — M542 Cervicalgia: Secondary | ICD-10-CM

## 2024-11-12 MED ORDER — METHYLPREDNISOLONE 4 MG PO TBPK: ORAL_TABLET | ORAL | 0 refills | Status: DC

## 2024-11-12 NOTE — Progress Notes (Signed)
 Orthopedic Office Visit  Patient comes in today for worsening pain in his back and pain radiating into the right lower extremity.  He feels it going along the lateral aspect of his right thigh and leg.  There was no trauma or injury that preceded the onset of this worsening pain.  He has not tried any specific treatments for this pain.  He rates the pain as a 8 out of 10 at its worst.  He notes the pain with activity and rest.  He has similar pain radiating into the left lower extremity but is not nearly as significant as the right.  No numbness or paresthesias.  No weakness in the lower extremities. Ambulating without assistive vices.  No bowel or bladder incontinence.  No saddle anesthesia.  On exam, he has 5 out of 5 strength in all lower extremity myotomes.  Sensation is intact to light touch in L3-S1 nerve distributions.  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  I recommended a Medrol  Dosepak which was prescribed to him today.  Also discussed the L4 6 transforaminal injection which he was interested in.  Referral provided to him today.   Patient can return to the office on an as needed basis  Michael DELENA Ada, MD Orthopedic Surgeon

## 2024-11-16 NOTE — Procedures (Signed)
 Diagnostic Cervical Facet Joint Nerve Block with Fluoroscopic Guidance  Patient: Michael [name]      Date of Birth: 1966-05-12 MRN: [id number] PCP: Denette Bateman, MD      Visit Date: 11/09/2024   Universal Protocol:    Date/Time: 12/22/20256:16 AM  Consent Given By: the patient  Position: PRONE  Additional Comments: Vital signs were monitored before and after the procedure. Patient was prepped and draped in the usual sterile fashion. The correct patient, procedure, and site was verified.   Injection Procedure Details:   Procedure diagnoses: Cervical spondylosis without myelopathy [M47.812]   Meds Administered:  Meds ordered this encounter  Medications   bupivacaine  (MARCAINE ) 0.5 % (with pres) injection 3 mL     Laterality: Bilateral  Location/Site:  C3-4 C4-5  Needle size: 25 G  Needle type: Spinal  Needle Placement: Articular Pillar  Findings:  -Contrast Used: 0.5 mL iohexol 180 mg iodine/mL   -Comments: Excellent flow of contrast across the articular pillars without intravascular flow  Procedure Details: The fluoroscope beam was positioned to square off the endplates of the desired vertebral level to achieve a true AP position. The beam was then moved in a small counter oblique to the contralateral side with a small amount of caudal tilt to achieve a trajectory alignment with the desired nerves.  For each target described below the skin was anesthetized with 1 ml of 1% Lidocaine  without epinephrine .  To block the facet joint nerves from C3 through C7, the lateral masses of these respective levels were localized under fluoroscopic visualization.  A spinal needle was inserted down to the waist at the above mentioned cervical levels.  The  needle was then walked off until it rested just lateral to the trough of the lateral mass of the medial branch nerve, which innervates the cervical facet joint.  After contact with periosteum and negative aspirate  for blood and CSF, correct placement without intravascular or epidural spread was confirmed by Bi-planar images and  injecting 0.5 ml. of Omnipaque-240.  A spot radiograph was obtained of this image.  Next, a 0.5 ml. volume of 1% Lidocaine  without Epinephrine  was then injected.  Prior to the procedure, the patient was given a Pain Diary which was completed for baseline measurements.  After the procedure, the patient rated their pain every 30 minutes and will continue rating at this frequency for a total of 5 hours.  The patient has been asked to complete the Diary and return to us  by mail, fax or hand delivered as soon as possible.   Additional Comments:  The patient tolerated the procedure well Dressing: Band-Aid    Post-procedure details: Patient was observed during the procedure. Post-procedure instructions were reviewed.  Patient left the clinic in stable condition.

## 2024-11-16 NOTE — Progress Notes (Signed)
 "  Michael [name] - 58 y.o. male MRN [id number]  Date of birth: 05/14/1966  Office Visit Note: Visit Date: 11/09/2024 PCP: Denette Bateman, MD Referred by: Denette Bateman, MD  Subjective: Chief Complaint  Patient presents with   Neck - Pain   HPI:  Michael [name] is a 58 y.o. male who comes in today for planned repeat Bilateral C3-4 and C4-5 Cervical facet/medial branch block with fluoroscopic guidance.  The patient has failed conservative care including home exercise, medications, time and activity modification.  This injection will be diagnostic and hopefully therapeutic.  Please see requesting physician notes for further details and justification.  Exam shows concordant low back pain with facet joint loading and extension. Patient received more than 80% pain relief from prior injection. This would be the second block in a diagnostic double block paradigm.     Referring:Megan Trudy, FNP and Dr. Ozell Ada   ROS Otherwise per HPI.  Assessment & Plan: Visit Diagnoses:    ICD-10-CM   1. Cervical spondylosis without myelopathy  M47.812 XR C-ARM NO REPORT    Nerve Block    bupivacaine  (MARCAINE ) 0.5 % (with pres) injection 3 mL      Plan: No additional findings.   Meds & Orders:  Meds ordered this encounter  Medications   bupivacaine  (MARCAINE ) 0.5 % (with pres) injection 3 mL    Orders Placed This Encounter  Procedures   Nerve Block   XR C-ARM NO REPORT    Follow-up: Return for Review Pain Diary.   Procedures: No procedures performed  Diagnostic Cervical Facet Joint Nerve Block with Fluoroscopic Guidance  Patient: Michael [name]      Date of Birth: 09/16/1966 MRN: [id number] PCP: Denette Bateman, MD      Visit Date: 11/09/2024   Universal Protocol:    Date/Time: 12/22/20256:16 AM  Consent Given By: the patient  Position: PRONE  Additional Comments: Vital signs were monitored before and after the procedure. Patient was  prepped and draped in the usual sterile fashion. The correct patient, procedure, and site was verified.   Injection Procedure Details:   Procedure diagnoses: Cervical spondylosis without myelopathy [M47.812]   Meds Administered:  Meds ordered this encounter  Medications   bupivacaine  (MARCAINE ) 0.5 % (with pres) injection 3 mL     Laterality: Bilateral  Location/Site:  C3-4 C4-5  Needle size: 25 G  Needle type: Spinal  Needle Placement: Articular Pillar  Findings:  -Contrast Used: 0.5 mL iohexol 180 mg iodine/mL   -Comments: Excellent flow of contrast across the articular pillars without intravascular flow  Procedure Details: The fluoroscope beam was positioned to square off the endplates of the desired vertebral level to achieve a true AP position. The beam was then moved in a small counter oblique to the contralateral side with a small amount of caudal tilt to achieve a trajectory alignment with the desired nerves.  For each target described below the skin was anesthetized with 1 ml of 1% Lidocaine  without epinephrine .  To block the facet joint nerves from C3 through C7, the lateral masses of these respective levels were localized under fluoroscopic visualization.  A spinal needle was inserted down to the waist at the above mentioned cervical levels.  The  needle was then walked off until it rested just lateral to the trough of the lateral mass of the medial branch nerve, which innervates the cervical facet joint.  After contact with periosteum and negative aspirate for blood and CSF, correct  placement without intravascular or epidural spread was confirmed by Bi-planar images and  injecting 0.5 ml. of Omnipaque-240.  A spot radiograph was obtained of this image.  Next, a 0.5 ml. volume of 1% Lidocaine  without Epinephrine  was then injected.  Prior to the procedure, the patient was given a Pain Diary which was completed for baseline measurements.  After the procedure, the  patient rated their pain every 30 minutes and will continue rating at this frequency for a total of 5 hours.  The patient has been asked to complete the Diary and return to us  by mail, fax or hand delivered as soon as possible.   Additional Comments:  The patient tolerated the procedure well Dressing: Band-Aid    Post-procedure details: Patient was observed during the procedure. Post-procedure instructions were reviewed.  Patient left the clinic in stable condition.      Clinical History: MRI LUMBAR SPINE WITHOUT AND WITH CONTRAST   TECHNIQUE: Multiplanar and multiecho pulse sequences of the lumbar spine were obtained without and with intravenous contrast.   CONTRAST:  19mL MULTIHANCE  GADOBENATE DIMEGLUMINE  529 MG/ML IV SOLN   COMPARISON:  MRI 01/07/2020   FINDINGS: Segmentation:  Standard.   Alignment:  Physiologic.   Vertebrae: No fracture, evidence of discitis, or bone lesion. Prior posterior and interbody fusion at L4-5 and L5-S1.   Conus medullaris and cauda equina: Conus extends to the L1 level. Conus and cauda equina appear normal.   Paraspinal and other soft tissues: No acute findings. Scattered cutaneous and subcutaneous cysts, unchanged.   Disc levels:   T12-L1: No disc protrusion. Mild bilateral facet arthropathy. No foraminal or canal stenosis. Unchanged.   L1-L2: No disc protrusion. Mild bilateral facet arthropathy. No foraminal or canal stenosis. Unchanged.   L2-L3: No disc protrusion. Mild bilateral facet arthropathy. No foraminal or canal stenosis. Unchanged.   L3-L4: Minimal annular disc bulge. Moderate bilateral facet arthropathy and ligamentum flavum buckling. Borderline-mild right foraminal stenosis, slightly progressed. No canal stenosis.   L4-L5: Prior decompression and fusion. Canal and foramina remain widely patent.   L5-S1: Prior decompression and fusion. Canal and foramina remain widely patent.   IMPRESSION: 1. Prior posterior  and interbody fusion at L4-5 and L5-S1 without residual stenosis. 2. Slight progression of degenerative changes at L3-L4 with borderline-mild right foraminal stenosis. 3. No canal stenosis at any level.     Electronically Signed   By: Mabel Converse D.O.   On: 02/28/2022 13:08     Objective:  VS:  HT:    WT:   BMI:     BP:122/74  HR:71bpm  TEMP: ( )  RESP:  Physical Exam Vitals and nursing note reviewed.  Constitutional:      General: He is not in acute distress.    Appearance: Normal appearance. He is not ill-appearing.  HENT:     Head: Normocephalic and atraumatic.     Right Ear: External ear normal.     Left Ear: External ear normal.  Eyes:     Extraocular Movements: Extraocular movements intact.  Cardiovascular:     Rate and Rhythm: Normal rate.     Pulses: Normal pulses.  Abdominal:     General: There is no distension.     Palpations: Abdomen is soft.  Musculoskeletal:        General: No signs of injury.     Cervical back: Neck supple. Tenderness present. No rigidity.     Right lower leg: No edema.     Left lower leg: No edema.  Comments: Patient has good strength in the upper extremities with 5 out of 5 strength in wrist extension long finger flexion APB.  No intrinsic hand muscle atrophy.  Negative Hoffmann's test.  Lymphadenopathy:     Cervical: No cervical adenopathy.  Skin:    Findings: No erythema or rash.  Neurological:     General: No focal deficit present.     Mental Status: He is alert and oriented to person, place, and time.     Sensory: No sensory deficit.     Motor: No weakness or abnormal muscle tone.     Coordination: Coordination normal.  Psychiatric:        Mood and Affect: Mood normal.        Behavior: Behavior normal.      Imaging: No results found. "

## 2024-11-19 ENCOUNTER — Other Ambulatory Visit: Payer: Self-pay | Admitting: Orthopedic Surgery

## 2024-11-27 ENCOUNTER — Other Ambulatory Visit: Payer: Self-pay | Admitting: Physical Medicine and Rehabilitation

## 2024-11-27 ENCOUNTER — Telehealth: Payer: Self-pay

## 2024-11-27 MED ORDER — DIAZEPAM 5 MG PO TABS: ORAL_TABLET | ORAL | 0 refills | Status: DC

## 2024-11-27 NOTE — Telephone Encounter (Signed)
 Per procedural valium  request.

## 2024-12-01 ENCOUNTER — Other Ambulatory Visit: Payer: Self-pay

## 2024-12-01 ENCOUNTER — Ambulatory Visit (INDEPENDENT_AMBULATORY_CARE_PROVIDER_SITE_OTHER): Admitting: Physical Medicine and Rehabilitation

## 2024-12-01 VITALS — BP 100/65 | HR 66

## 2024-12-01 DIAGNOSIS — M47812 Spondylosis without myelopathy or radiculopathy, cervical region: Secondary | ICD-10-CM | POA: Diagnosis not present

## 2024-12-01 MED ORDER — METHYLPREDNISOLONE ACETATE 40 MG/ML IJ SUSP
40.0000 mg | Freq: Once | INTRAMUSCULAR | Status: AC
  Administered 2024-12-01: 40 mg

## 2024-12-01 NOTE — Progress Notes (Signed)
 Pain Scale   Average Pain 8 Patient advising he has chronic neck pain that radiates to left side, pain increases when walking and moving head, decreases when resting.        +Driver, -BT, +Dye Allergies. ( iodine 131)

## 2024-12-01 NOTE — Progress Notes (Signed)
 "  Michael [name] - 59 y.o. male MRN [id number]  Date of birth: [birth year]  Office Visit Note: Visit Date: 12/01/2024 PCP: Denette Bateman, MD Referred by: Denette Bateman, MD  Subjective: Chief Complaint  Patient presents with   Neck - Pain   HPI:  Michael [name] is a 59 y.o. male who comes in todayfor planned radiofrequency ablation of the Left C3-4 and C4-5 Cervical facet joints. This would be ablation of the corresponding medial branches and/or dorsal rami.  Patient has had double diagnostic blocks with more than 50% relief.  These are documented on pain diary.  They have had chronic back pain for quite some time, more than 3 months, which has been an ongoing situation with recalcitrant axial back pain.  They have no radicular pain.  Their axial pain is worse with standing and ambulating and on exam today with facet loading.  They have had physical therapy as well as home exercise program.  The imaging noted in the chart below indicated facet pathology. Accordingly they meet all the criteria and qualification for for radiofrequency ablation and we are going to complete this today hopefully for more longer term relief as part of comprehensive management program.   ROS Otherwise per HPI.  Assessment & Plan: Visit Diagnoses:    ICD-10-CM   1. Cervical spondylosis without myelopathy  M47.812 XR C-ARM NO REPORT    Radiofrequency,Cervical    methylPREDNISolone  acetate (DEPO-MEDROL ) injection 40 mg      Plan: No additional findings.   Meds & Orders:  Meds ordered this encounter  Medications   methylPREDNISolone  acetate (DEPO-MEDROL ) injection 40 mg    Orders Placed This Encounter  Procedures   Radiofrequency,Cervical   XR C-ARM NO REPORT    Follow-up: Return if symptoms worsen or fail to improve.   Procedures: No procedures performed      Clinical History: MRI LUMBAR SPINE WITHOUT AND WITH CONTRAST   TECHNIQUE: Multiplanar and multiecho pulse  sequences of the lumbar spine were obtained without and with intravenous contrast.   CONTRAST:  19mL MULTIHANCE  GADOBENATE DIMEGLUMINE  529 MG/ML IV SOLN   COMPARISON:  MRI 01/07/2020   FINDINGS: Segmentation:  Standard.   Alignment:  Physiologic.   Vertebrae: No fracture, evidence of discitis, or bone lesion. Prior posterior and interbody fusion at L4-5 and L5-S1.   Conus medullaris and cauda equina: Conus extends to the L1 level. Conus and cauda equina appear normal.   Paraspinal and other soft tissues: No acute findings. Scattered cutaneous and subcutaneous cysts, unchanged.   Disc levels:   T12-L1: No disc protrusion. Mild bilateral facet arthropathy. No foraminal or canal stenosis. Unchanged.   L1-L2: No disc protrusion. Mild bilateral facet arthropathy. No foraminal or canal stenosis. Unchanged.   L2-L3: No disc protrusion. Mild bilateral facet arthropathy. No foraminal or canal stenosis. Unchanged.   L3-L4: Minimal annular disc bulge. Moderate bilateral facet arthropathy and ligamentum flavum buckling. Borderline-mild right foraminal stenosis, slightly progressed. No canal stenosis.   L4-L5: Prior decompression and fusion. Canal and foramina remain widely patent.   L5-S1: Prior decompression and fusion. Canal and foramina remain widely patent.   IMPRESSION: 1. Prior posterior and interbody fusion at L4-5 and L5-S1 without residual stenosis. 2. Slight progression of degenerative changes at L3-L4 with borderline-mild right foraminal stenosis. 3. No canal stenosis at any level.     Electronically Signed   By: Mabel Converse D.O.   On: 02/28/2022 13:08     Objective:  VS:  HT:  WT:   BMI:     BP:100/65  HR:66bpm  TEMP: ( )  RESP:  Physical Exam Vitals and nursing note reviewed.  Constitutional:      General: He is not in acute distress.    Appearance: Normal appearance. He is not ill-appearing.  HENT:     Head: Normocephalic and atraumatic.      Right Ear: External ear normal.     Left Ear: External ear normal.  Eyes:     Extraocular Movements: Extraocular movements intact.  Cardiovascular:     Rate and Rhythm: Normal rate.     Pulses: Normal pulses.  Abdominal:     General: There is no distension.     Palpations: Abdomen is soft.  Musculoskeletal:        General: No signs of injury.     Cervical back: Neck supple. Tenderness present. No rigidity.     Right lower leg: No edema.     Left lower leg: No edema.     Comments: Patient has good strength in the upper extremities with 5 out of 5 strength in wrist extension long finger flexion APB.  No intrinsic hand muscle atrophy.  Negative Hoffmann's test.  Lymphadenopathy:     Cervical: No cervical adenopathy.  Skin:    Findings: No erythema or rash.  Neurological:     General: No focal deficit present.     Mental Status: He is alert and oriented to person, place, and time.     Sensory: No sensory deficit.     Motor: No weakness or abnormal muscle tone.     Coordination: Coordination normal.  Psychiatric:        Mood and Affect: Mood normal.        Behavior: Behavior normal.      Imaging: No results found. "

## 2024-12-09 ENCOUNTER — Ambulatory Visit: Admitting: Orthopedic Surgery

## 2024-12-15 ENCOUNTER — Ambulatory Visit: Admitting: Physical Medicine and Rehabilitation

## 2024-12-15 ENCOUNTER — Other Ambulatory Visit: Payer: Self-pay

## 2024-12-15 VITALS — BP 119/69 | HR 79

## 2024-12-15 DIAGNOSIS — F411 Generalized anxiety disorder: Secondary | ICD-10-CM

## 2024-12-15 DIAGNOSIS — M542 Cervicalgia: Secondary | ICD-10-CM | POA: Diagnosis not present

## 2024-12-15 DIAGNOSIS — M47812 Spondylosis without myelopathy or radiculopathy, cervical region: Secondary | ICD-10-CM | POA: Diagnosis not present

## 2024-12-15 MED ORDER — DIAZEPAM 5 MG PO TABS: ORAL_TABLET | ORAL | 0 refills | Status: AC

## 2024-12-15 MED ORDER — METHYLPREDNISOLONE ACETATE 40 MG/ML IJ SUSP
40.0000 mg | Freq: Once | INTRAMUSCULAR | Status: AC
  Administered 2024-12-15: 40 mg

## 2024-12-15 NOTE — Progress Notes (Signed)
 Pain Scale   Average Pain 2 Patient advising he has chronic neck pain that radiates to right side, pain comes and goes.        +Driver, -BT, -Dye Allergies.

## 2024-12-21 NOTE — Progress Notes (Signed)
 "  Michael [name] - 59 y.o. male MRN [id number]  Date of birth: 1966-02-18  Office Visit Note: Visit Date: 12/15/2024 PCP: Denette Bateman, MD Referred by: Denette Bateman, MD  Subjective: Chief Complaint  Patient presents with   Neck - Pain   HPI:  Michael [name] is a 59 y.o. male who comes in todayfor planned radiofrequency ablation of the Right C3-4 and C4-5 Cervical facet joints. This would be ablation of the corresponding medial branches and/or dorsal rami.  Patient has had double diagnostic blocks with more than 50% relief.  These are documented on pain diary.  They have had chronic back pain for quite some time, more than 3 months, which has been an ongoing situation with recalcitrant axial back pain.  They have no radicular pain.  Their axial pain is worse with standing and ambulating and on exam today with facet loading.  They have had physical therapy as well as home exercise program.  The imaging noted in the chart below indicated facet pathology. Accordingly they meet all the criteria and qualification for for radiofrequency ablation and we are going to complete this today hopefully for more longer term relief as part of comprehensive management program.  In looking ahead at upcoming lumbar epidural I noticed his labs were trending to lower platelets with last lab 60k. I then saw where he had recent nosebleed and saw ENT. I reviewed recent CBC results with patient and his labs are essentially trending to pancytopenia. I have asked that he follow up with PCP or urgent care where the labs have been drawn. Also, he asked for one valium  for post procedure muscle relaxer tonight and I did prescribe that.   ROS Otherwise per HPI.  Assessment & Plan: Visit Diagnoses:    ICD-10-CM   1. Cervical spondylosis without myelopathy  M47.812 XR C-ARM NO REPORT    Radiofrequency,Cervical    methylPREDNISolone  acetate (DEPO-MEDROL ) injection 40 mg    2. Cervicalgia  M54.2 XR  C-ARM NO REPORT    Radiofrequency,Cervical    methylPREDNISolone  acetate (DEPO-MEDROL ) injection 40 mg    3. Pre-operative anxiety  F41.1 diazepam  (VALIUM ) 5 MG tablet      Plan: No additional findings.   Meds & Orders:  Meds ordered this encounter  Medications   methylPREDNISolone  acetate (DEPO-MEDROL ) injection 40 mg   diazepam  (VALIUM ) 5 MG tablet    Sig: Take one tablet (5 mg) by mouth with food one hour prior to bed time.    Dispense:  2 tablet    Refill:  0    Orders Placed This Encounter  Procedures   Radiofrequency,Cervical   XR C-ARM NO REPORT    Follow-up: Return for visit to requesting provider as needed.   Procedures: No procedures performed  Cervical Facet Nerve Denervation  Patient: Michael [name]      Date of Birth: 1966-09-09 MRN: [id number] PCP: Denette Bateman, MD      Visit Date: 12/15/2024   Universal Protocol:    Date/Time: 01/26/266:27 PM  Consent Given By: the patient  Position: PRONE  Additional Comments: Vital signs were monitored before and after the procedure. Patient was prepped and draped in the usual sterile fashion. The correct patient, procedure, and site was verified.   Injection Procedure Details:   Procedure diagnoses:  1. Cervical spondylosis without myelopathy   2. Cervicalgia   3. Pre-operative anxiety      Meds Administered:  Meds ordered this encounter  Medications   methylPREDNISolone   acetate (DEPO-MEDROL ) injection 40 mg   diazepam  (VALIUM ) 5 MG tablet    Sig: Take one tablet (5 mg) by mouth with food one hour prior to bed time.    Dispense:  2 tablet    Refill:  0     Laterality: Right  Location/Site:  C3-4 C4-5  Needle size: 20 G  Needle type: RF cannula, 5 mm active tip  Findings:  -Comments:   Procedure Details: The fluoroscope beam was positioned to square off the endplates of the desired vertebral level to achieve a true AP position. The beam was then moved in a small counter  oblique to the contralateral side with a small amount of caudal tilt to achieve a trajectory alignment with the desired nerves.  For each target described below the skin was anesthetized with 1 ml of 1% Lidocaine  without epinephrine .  To denervate the facet joint nerves from C3 through C7, the lateral masses of these respective levels were localized under fluoroscopic visualization.  An outer 20 gauge, 5mm active tip cannula was inserted down the waist at the above mentioned cervical levels. The needle was then walked off until it rested just lateral to the trough of the lateral mass of the medial branch nerve, which innervates the cervical facet joint.   For all of these levels, AP and lateral images were used to confirm location.  The radiofrequency probe was inserted into the cannula and stimulation was carried out at both sensory and motor levels to make sure there was expected stimulation without a radicular pattern. Subsequently, this was removed and then 0.5 to 1 ml. of 1% Lidocaine  was injected. The radiofrequency probe was re-inserted and denervation of the facet nerves (medial branches of the dorsal rami innervating the facet joints) was carried out at 80 degrees Celsius for 90 seconds. The above procedure was repeated for each facet joint nerve mentioned above. Radiographs were obtained at each level (unless otherwise noted) to verify probe placement during the neurotomy.  Additional Comments:  The patient tolerated the procedure well Dressing: Band-Aid    Post-procedure details: Patient was observed during the procedure. Post-procedure instructions were reviewed. Patient left the clinic in stable condition.      Clinical History: MRI LUMBAR SPINE WITHOUT AND WITH CONTRAST   TECHNIQUE: Multiplanar and multiecho pulse sequences of the lumbar spine were obtained without and with intravenous contrast.   CONTRAST:  19mL MULTIHANCE  GADOBENATE DIMEGLUMINE  529 MG/ML IV SOLN    COMPARISON:  MRI 01/07/2020   FINDINGS: Segmentation:  Standard.   Alignment:  Physiologic.   Vertebrae: No fracture, evidence of discitis, or bone lesion. Prior posterior and interbody fusion at L4-5 and L5-S1.   Conus medullaris and cauda equina: Conus extends to the L1 level. Conus and cauda equina appear normal.   Paraspinal and other soft tissues: No acute findings. Scattered cutaneous and subcutaneous cysts, unchanged.   Disc levels:   T12-L1: No disc protrusion. Mild bilateral facet arthropathy. No foraminal or canal stenosis. Unchanged.   L1-L2: No disc protrusion. Mild bilateral facet arthropathy. No foraminal or canal stenosis. Unchanged.   L2-L3: No disc protrusion. Mild bilateral facet arthropathy. No foraminal or canal stenosis. Unchanged.   L3-L4: Minimal annular disc bulge. Moderate bilateral facet arthropathy and ligamentum flavum buckling. Borderline-mild right foraminal stenosis, slightly progressed. No canal stenosis.   L4-L5: Prior decompression and fusion. Canal and foramina remain widely patent.   L5-S1: Prior decompression and fusion. Canal and foramina remain widely patent.   IMPRESSION: 1. Prior posterior  and interbody fusion at L4-5 and L5-S1 without residual stenosis. 2. Slight progression of degenerative changes at L3-L4 with borderline-mild right foraminal stenosis. 3. No canal stenosis at any level.     Electronically Signed   By: Mabel Converse D.O.   On: 02/28/2022 13:08     Objective:  VS:  HT:    WT:   BMI:     BP:119/69  HR:79bpm  TEMP: ( )  RESP:  Physical Exam Vitals and nursing note reviewed.  Constitutional:      General: He is not in acute distress.    Appearance: Normal appearance. He is not ill-appearing.  HENT:     Head: Normocephalic and atraumatic.     Right Ear: External ear normal.     Left Ear: External ear normal.  Eyes:     Extraocular Movements: Extraocular movements intact.  Cardiovascular:      Rate and Rhythm: Normal rate.     Pulses: Normal pulses.  Abdominal:     General: There is no distension.     Palpations: Abdomen is soft.  Musculoskeletal:        General: No signs of injury.     Cervical back: Neck supple. Tenderness present. No rigidity.     Right lower leg: No edema.     Left lower leg: No edema.     Comments: Patient has good strength in the upper extremities with 5 out of 5 strength in wrist extension long finger flexion APB.  No intrinsic hand muscle atrophy.  Negative Hoffmann's test.  Lymphadenopathy:     Cervical: No cervical adenopathy.  Skin:    Findings: No erythema or rash.  Neurological:     General: No focal deficit present.     Mental Status: He is alert and oriented to person, place, and time.     Sensory: No sensory deficit.     Motor: No weakness or abnormal muscle tone.     Coordination: Coordination normal.  Psychiatric:        Mood and Affect: Mood normal.        Behavior: Behavior normal.      Imaging: No results found. "

## 2024-12-21 NOTE — Procedures (Signed)
 Cervical Facet Nerve Denervation  Patient: Michael [name]      Date of Birth: 05-03-1966 MRN: [id number] PCP: Denette Bateman, MD      Visit Date: 12/15/2024   Universal Protocol:    Date/Time: 01/26/266:27 PM  Consent Given By: the patient  Position: PRONE  Additional Comments: Vital signs were monitored before and after the procedure. Patient was prepped and draped in the usual sterile fashion. The correct patient, procedure, and site was verified.   Injection Procedure Details:   Procedure diagnoses:  1. Cervical spondylosis without myelopathy   2. Cervicalgia   3. Pre-operative anxiety      Meds Administered:  Meds ordered this encounter  Medications   methylPREDNISolone  acetate (DEPO-MEDROL ) injection 40 mg   diazepam  (VALIUM ) 5 MG tablet    Sig: Take one tablet (5 mg) by mouth with food one hour prior to bed time.    Dispense:  2 tablet    Refill:  0     Laterality: Right  Location/Site:  C3-4 C4-5  Needle size: 20 G  Needle type: RF cannula, 5 mm active tip  Findings:  -Comments:   Procedure Details: The fluoroscope beam was positioned to square off the endplates of the desired vertebral level to achieve a true AP position. The beam was then moved in a small counter oblique to the contralateral side with a small amount of caudal tilt to achieve a trajectory alignment with the desired nerves.  For each target described below the skin was anesthetized with 1 ml of 1% Lidocaine  without epinephrine .  To denervate the facet joint nerves from C3 through C7, the lateral masses of these respective levels were localized under fluoroscopic visualization.  An outer 20 gauge, 5mm active tip cannula was inserted down the waist at the above mentioned cervical levels. The needle was then walked off until it rested just lateral to the trough of the lateral mass of the medial branch nerve, which innervates the cervical facet joint.   For all of these  levels, AP and lateral images were used to confirm location.  The radiofrequency probe was inserted into the cannula and stimulation was carried out at both sensory and motor levels to make sure there was expected stimulation without a radicular pattern. Subsequently, this was removed and then 0.5 to 1 ml. of 1% Lidocaine  was injected. The radiofrequency probe was re-inserted and denervation of the facet nerves (medial branches of the dorsal rami innervating the facet joints) was carried out at 80 degrees Celsius for 90 seconds. The above procedure was repeated for each facet joint nerve mentioned above. Radiographs were obtained at each level (unless otherwise noted) to verify probe placement during the neurotomy.  Additional Comments:  The patient tolerated the procedure well Dressing: Band-Aid    Post-procedure details: Patient was observed during the procedure. Post-procedure instructions were reviewed. Patient left the clinic in stable condition.

## 2024-12-30 ENCOUNTER — Other Ambulatory Visit: Payer: Self-pay

## 2024-12-30 ENCOUNTER — Ambulatory Visit: Admitting: Physical Medicine and Rehabilitation

## 2024-12-30 VITALS — BP 105/69 | HR 84

## 2024-12-30 DIAGNOSIS — M5416 Radiculopathy, lumbar region: Secondary | ICD-10-CM

## 2024-12-30 MED ORDER — METHYLPREDNISOLONE ACETATE 40 MG/ML IJ SUSP: 40.0000 mg | Freq: Once | INTRAMUSCULAR | Status: AC

## 2024-12-30 NOTE — Progress Notes (Unsigned)
 Pain Scale   Average Pain 3 Patient advising he has chronic lower back that radiates bilaterally to leg at times. Pain is constant.        +Driver, -BT, -Dye Allergies.

## 2025-02-04 ENCOUNTER — Ambulatory Visit: Admitting: Orthopedic Surgery
